# Patient Record
Sex: Female | Born: 1935 | Race: White | Hispanic: No | Marital: Married | Smoking: Former smoker
Health system: Southern US, Community
[De-identification: ages and names within clinical notes are randomized; demographics above are authoritative.]

## PROBLEM LIST (undated history)

## (undated) DIAGNOSIS — H9190 Unspecified hearing loss, unspecified ear: Secondary | ICD-10-CM

## (undated) DIAGNOSIS — M1990 Unspecified osteoarthritis, unspecified site: Secondary | ICD-10-CM

## (undated) DIAGNOSIS — K635 Polyp of colon: Secondary | ICD-10-CM

## (undated) DIAGNOSIS — I10 Essential (primary) hypertension: Secondary | ICD-10-CM

## (undated) DIAGNOSIS — L989 Disorder of the skin and subcutaneous tissue, unspecified: Secondary | ICD-10-CM

## (undated) DIAGNOSIS — K2950 Unspecified chronic gastritis without bleeding: Secondary | ICD-10-CM

## (undated) DIAGNOSIS — L509 Urticaria, unspecified: Secondary | ICD-10-CM

## (undated) DIAGNOSIS — E7800 Pure hypercholesterolemia, unspecified: Secondary | ICD-10-CM

## (undated) DIAGNOSIS — J449 Chronic obstructive pulmonary disease, unspecified: Secondary | ICD-10-CM

## (undated) DIAGNOSIS — K648 Other hemorrhoids: Secondary | ICD-10-CM

## (undated) DIAGNOSIS — K2980 Duodenitis without bleeding: Secondary | ICD-10-CM

## (undated) DIAGNOSIS — K219 Gastro-esophageal reflux disease without esophagitis: Secondary | ICD-10-CM

## (undated) HISTORY — DX: Pure hypercholesterolemia, unspecified: E78.00

## (undated) HISTORY — DX: Duodenitis without bleeding: K29.80

## (undated) HISTORY — PX: COLONOSCOPY: SHX174

## (undated) HISTORY — DX: Other hemorrhoids: K64.8

## (undated) HISTORY — DX: Gastro-esophageal reflux disease without esophagitis: K21.9

## (undated) HISTORY — DX: Essential (primary) hypertension: I10

## (undated) HISTORY — DX: Urticaria, unspecified: L50.9

## (undated) HISTORY — DX: Disorder of the skin and subcutaneous tissue, unspecified: L98.9

## (undated) HISTORY — DX: Unspecified hearing loss, unspecified ear: H91.90

## (undated) HISTORY — DX: Unspecified osteoarthritis, unspecified site: M19.90

## (undated) HISTORY — PX: CATARACT EXTRACTION: SUR2

## (undated) HISTORY — PX: NOSE SURGERY: SHX723

## (undated) HISTORY — PX: ESOPHAGOGASTRODUODENOSCOPY: SHX1529

## (undated) HISTORY — PX: APPENDECTOMY: SHX54

## (undated) HISTORY — PX: REFRACTIVE SURGERY: SHX103

## (undated) HISTORY — DX: Unspecified chronic gastritis without bleeding: K29.50

## (undated) HISTORY — DX: Polyp of colon: K63.5

---

## 1967-02-27 HISTORY — PX: DILATION AND CURETTAGE OF UTERUS: SHX78

## 1998-04-28 ENCOUNTER — Other Ambulatory Visit: Admission: RE | Admit: 1998-04-28 | Discharge: 1998-04-28 | Payer: Self-pay | Admitting: Obstetrics and Gynecology

## 1998-09-16 ENCOUNTER — Encounter (INDEPENDENT_AMBULATORY_CARE_PROVIDER_SITE_OTHER): Payer: Self-pay

## 1998-09-16 ENCOUNTER — Ambulatory Visit (HOSPITAL_COMMUNITY): Admission: RE | Admit: 1998-09-16 | Discharge: 1998-09-16 | Payer: Self-pay | Admitting: Obstetrics and Gynecology

## 1999-05-03 ENCOUNTER — Other Ambulatory Visit: Admission: RE | Admit: 1999-05-03 | Discharge: 1999-05-03 | Payer: Self-pay | Admitting: Obstetrics and Gynecology

## 2000-05-13 ENCOUNTER — Other Ambulatory Visit: Admission: RE | Admit: 2000-05-13 | Discharge: 2000-05-13 | Payer: Self-pay | Admitting: Obstetrics and Gynecology

## 2001-04-17 ENCOUNTER — Ambulatory Visit (HOSPITAL_COMMUNITY): Admission: RE | Admit: 2001-04-17 | Discharge: 2001-04-17 | Payer: Self-pay | Admitting: Gastroenterology

## 2001-04-17 ENCOUNTER — Encounter: Payer: Self-pay | Admitting: Gastroenterology

## 2001-04-22 ENCOUNTER — Encounter (INDEPENDENT_AMBULATORY_CARE_PROVIDER_SITE_OTHER): Payer: Self-pay | Admitting: Gastroenterology

## 2001-04-22 DIAGNOSIS — K2980 Duodenitis without bleeding: Secondary | ICD-10-CM | POA: Insufficient documentation

## 2001-04-22 DIAGNOSIS — K2940 Chronic atrophic gastritis without bleeding: Secondary | ICD-10-CM | POA: Insufficient documentation

## 2001-05-21 ENCOUNTER — Other Ambulatory Visit: Admission: RE | Admit: 2001-05-21 | Discharge: 2001-05-21 | Payer: Self-pay | Admitting: Obstetrics and Gynecology

## 2003-05-26 ENCOUNTER — Other Ambulatory Visit: Admission: RE | Admit: 2003-05-26 | Discharge: 2003-05-26 | Payer: Self-pay | Admitting: Obstetrics and Gynecology

## 2004-03-17 ENCOUNTER — Ambulatory Visit: Payer: Self-pay | Admitting: Internal Medicine

## 2004-07-04 ENCOUNTER — Ambulatory Visit: Payer: Self-pay | Admitting: Internal Medicine

## 2004-09-11 ENCOUNTER — Ambulatory Visit: Payer: Self-pay | Admitting: Gastroenterology

## 2004-10-13 ENCOUNTER — Ambulatory Visit: Payer: Self-pay | Admitting: Internal Medicine

## 2004-10-21 ENCOUNTER — Emergency Department (HOSPITAL_COMMUNITY): Admission: EM | Admit: 2004-10-21 | Discharge: 2004-10-21 | Payer: Self-pay | Admitting: Emergency Medicine

## 2004-10-26 ENCOUNTER — Emergency Department (HOSPITAL_COMMUNITY): Admission: EM | Admit: 2004-10-26 | Discharge: 2004-10-26 | Payer: Self-pay | Admitting: Emergency Medicine

## 2004-12-08 ENCOUNTER — Ambulatory Visit: Payer: Self-pay | Admitting: Gastroenterology

## 2005-01-11 ENCOUNTER — Ambulatory Visit: Payer: Self-pay | Admitting: Internal Medicine

## 2005-02-09 ENCOUNTER — Ambulatory Visit: Payer: Self-pay | Admitting: Internal Medicine

## 2005-04-20 ENCOUNTER — Ambulatory Visit: Payer: Self-pay | Admitting: Internal Medicine

## 2005-06-14 ENCOUNTER — Other Ambulatory Visit: Admission: RE | Admit: 2005-06-14 | Discharge: 2005-06-14 | Payer: Self-pay | Admitting: Obstetrics and Gynecology

## 2005-08-09 ENCOUNTER — Ambulatory Visit: Payer: Self-pay | Admitting: Internal Medicine

## 2005-09-14 ENCOUNTER — Ambulatory Visit: Payer: Self-pay | Admitting: Gastroenterology

## 2005-12-06 ENCOUNTER — Ambulatory Visit: Payer: Self-pay | Admitting: Internal Medicine

## 2006-02-21 ENCOUNTER — Ambulatory Visit: Payer: Self-pay | Admitting: Internal Medicine

## 2006-04-16 ENCOUNTER — Ambulatory Visit: Payer: Self-pay | Admitting: Internal Medicine

## 2006-08-14 ENCOUNTER — Ambulatory Visit: Payer: Self-pay | Admitting: Internal Medicine

## 2006-09-17 ENCOUNTER — Ambulatory Visit: Payer: Self-pay | Admitting: Internal Medicine

## 2006-12-09 ENCOUNTER — Ambulatory Visit: Payer: Self-pay | Admitting: Internal Medicine

## 2007-01-15 DIAGNOSIS — Z860100 Personal history of colon polyps, unspecified: Secondary | ICD-10-CM | POA: Insufficient documentation

## 2007-01-15 DIAGNOSIS — J4520 Mild intermittent asthma, uncomplicated: Secondary | ICD-10-CM

## 2007-01-15 DIAGNOSIS — J309 Allergic rhinitis, unspecified: Secondary | ICD-10-CM | POA: Insufficient documentation

## 2007-01-15 DIAGNOSIS — K219 Gastro-esophageal reflux disease without esophagitis: Secondary | ICD-10-CM

## 2007-02-25 ENCOUNTER — Ambulatory Visit: Payer: Self-pay | Admitting: Internal Medicine

## 2007-02-28 ENCOUNTER — Ambulatory Visit: Payer: Self-pay | Admitting: Internal Medicine

## 2007-02-28 DIAGNOSIS — J069 Acute upper respiratory infection, unspecified: Secondary | ICD-10-CM | POA: Insufficient documentation

## 2007-04-11 ENCOUNTER — Ambulatory Visit: Payer: Self-pay | Admitting: Internal Medicine

## 2007-04-24 ENCOUNTER — Ambulatory Visit: Payer: Self-pay | Admitting: Internal Medicine

## 2007-05-07 ENCOUNTER — Encounter (INDEPENDENT_AMBULATORY_CARE_PROVIDER_SITE_OTHER): Payer: Self-pay | Admitting: Gastroenterology

## 2007-05-07 ENCOUNTER — Ambulatory Visit: Payer: Self-pay | Admitting: Internal Medicine

## 2007-05-07 ENCOUNTER — Encounter: Payer: Self-pay | Admitting: Internal Medicine

## 2007-06-11 DIAGNOSIS — J449 Chronic obstructive pulmonary disease, unspecified: Secondary | ICD-10-CM

## 2007-06-11 DIAGNOSIS — M1990 Unspecified osteoarthritis, unspecified site: Secondary | ICD-10-CM | POA: Insufficient documentation

## 2007-06-11 DIAGNOSIS — K649 Unspecified hemorrhoids: Secondary | ICD-10-CM | POA: Insufficient documentation

## 2007-06-11 DIAGNOSIS — I10 Essential (primary) hypertension: Secondary | ICD-10-CM | POA: Insufficient documentation

## 2007-06-11 DIAGNOSIS — E785 Hyperlipidemia, unspecified: Secondary | ICD-10-CM

## 2007-06-11 DIAGNOSIS — J4489 Other specified chronic obstructive pulmonary disease: Secondary | ICD-10-CM | POA: Insufficient documentation

## 2007-06-23 ENCOUNTER — Other Ambulatory Visit: Admission: RE | Admit: 2007-06-23 | Discharge: 2007-06-23 | Payer: Self-pay | Admitting: Obstetrics and Gynecology

## 2007-08-01 ENCOUNTER — Telehealth: Payer: Self-pay | Admitting: Internal Medicine

## 2007-08-01 ENCOUNTER — Encounter: Payer: Self-pay | Admitting: Internal Medicine

## 2007-08-18 ENCOUNTER — Ambulatory Visit: Payer: Self-pay | Admitting: Internal Medicine

## 2007-09-19 ENCOUNTER — Ambulatory Visit: Payer: Self-pay | Admitting: Internal Medicine

## 2007-12-05 ENCOUNTER — Ambulatory Visit: Payer: Self-pay | Admitting: Internal Medicine

## 2008-02-02 ENCOUNTER — Ambulatory Visit: Payer: Self-pay | Admitting: Internal Medicine

## 2008-02-24 ENCOUNTER — Ambulatory Visit: Payer: Self-pay | Admitting: Internal Medicine

## 2008-03-16 ENCOUNTER — Telehealth: Payer: Self-pay | Admitting: Internal Medicine

## 2008-04-07 ENCOUNTER — Ambulatory Visit: Payer: Self-pay | Admitting: Internal Medicine

## 2008-06-30 ENCOUNTER — Ambulatory Visit: Payer: Self-pay | Admitting: Obstetrics and Gynecology

## 2008-07-20 ENCOUNTER — Telehealth: Payer: Self-pay | Admitting: Internal Medicine

## 2008-07-30 ENCOUNTER — Encounter: Payer: Self-pay | Admitting: Internal Medicine

## 2008-08-05 ENCOUNTER — Telehealth: Payer: Self-pay | Admitting: Internal Medicine

## 2008-08-05 ENCOUNTER — Ambulatory Visit: Payer: Self-pay | Admitting: Internal Medicine

## 2008-12-03 ENCOUNTER — Ambulatory Visit: Payer: Self-pay | Admitting: Internal Medicine

## 2009-01-26 ENCOUNTER — Telehealth (INDEPENDENT_AMBULATORY_CARE_PROVIDER_SITE_OTHER): Payer: Self-pay | Admitting: *Deleted

## 2009-02-24 ENCOUNTER — Ambulatory Visit: Payer: Self-pay | Admitting: Internal Medicine

## 2009-02-28 ENCOUNTER — Telehealth: Payer: Self-pay | Admitting: Internal Medicine

## 2009-07-05 ENCOUNTER — Other Ambulatory Visit: Admission: RE | Admit: 2009-07-05 | Discharge: 2009-07-05 | Payer: Self-pay | Admitting: Obstetrics and Gynecology

## 2009-07-05 ENCOUNTER — Ambulatory Visit: Payer: Self-pay | Admitting: Obstetrics and Gynecology

## 2009-08-05 ENCOUNTER — Ambulatory Visit: Payer: Self-pay | Admitting: Internal Medicine

## 2009-08-09 ENCOUNTER — Encounter: Payer: Self-pay | Admitting: Internal Medicine

## 2009-08-09 ENCOUNTER — Telehealth: Payer: Self-pay | Admitting: Internal Medicine

## 2009-08-16 ENCOUNTER — Ambulatory Visit: Payer: Self-pay | Admitting: Internal Medicine

## 2009-12-23 ENCOUNTER — Ambulatory Visit: Payer: Self-pay | Admitting: Internal Medicine

## 2010-02-23 ENCOUNTER — Ambulatory Visit: Payer: Self-pay | Admitting: Internal Medicine

## 2010-03-28 NOTE — Progress Notes (Signed)
Summary: Nexium   Phone Note Call from Patient Call back at Home Phone [phone]   Caller: Patient Call For: Dr. Leone Payor Reason for Call: Talk to Nurse Summary of Call: having trouble filling Nexium... rx renewal given to pharamcy is not sufficient Initial call taken by: Vallarie Mare,  August 09, 2009 10:12 AM  Follow-up for Phone Call        Prior authorization started.  [employer] will fax form to office. Francee Piccolo CMA Duncan Dull)  August 09, 2009 2:05 PM      Appended Document: Nexium Prior Auth/Approved Nexium approved by [employer].  Pt notified and samples left at front desk.   Clinical Lists Changes  Medications: Changed medication from NEXIUM 40 MG  CPDR (ESOMEPRAZOLE MAGNESIUM) 1 capsule each day 30 minutes before meal to NEXIUM 40 MG  CPDR (ESOMEPRAZOLE MAGNESIUM) 1 capsule each day 30 minutes before meal

## 2010-03-28 NOTE — Assessment & Plan Note (Signed)
Summary: 2 yr f/u...as.    History of Present Illness Visit Type: Follow-up Visit Primary GI MD: Stan Head MD [facility] Primary Provider: [facility] Requesting Provider: n/a Chief Complaint: Patient here for routine follow up of GERD. History of Present Illness:   77 yo white woman, retired Pharmacist, hospital, being seen to follow-up GERD. She  denies any current GI symptoms. Her reflux is controlled on Nexium. She has tried Prilosec OTC in past and that has not relieved her GERD sxs.           EGD  Procedure date:  04/22/2001  Findings:      Hiatal Hernia H. pylori gastritis (Treated) Duodenitis  52 French dilation  Dr. Blossom Hoops   Current Medications (verified): 1)  Feosol 300 Mg  Tabs (Ferrous Sulfate) .... One Tab Twice Daily 2)  Dyazide 37.5-25 Mg  Caps (Triamterene-Hctz) .... Take One Tab By Mouth Once Daily 3)  Calcium 600 Mg  Tabs (Calcium) .... Take Three Times A Day 4)  Multivitamins   Tabs (Multiple Vitamin) .... Take One Tab By Mouth Once Daily 5)  Diclofenac Sodium 25 Mg Tbec (Diclofenac Sodium) .... Three Times A Day 6)  Bd Tb Syringe 25g X 5/8" 1 Ml  Misc (Tuberculin-Allergy Syringes) .... Use To Give Allergy Vaccine As Directed 7)  Allergy Vaccine 1:10 Go (W-E) .... Once Weekly Since 12/08 8)  Epi Pen .... Prn 9)  Proventil Hfa 108 (90 Base) Mcg/act  Aers (Albuterol Sulfate) .... 2 Puffs Four Times A Day As Needed 10)  Simvastatin 20 Mg  Tabs (Simvastatin) .[name].. 1 Once Daily 11)  Nexium 40 Mg  Cpdr (Esomeprazole Magnesium) .[name].. 1 Capsule Each Day 30 Minutes Before Meal 12)  Salmon Oil  Caps (Nutritional Supplements) .... Three Times A Day 13)  Tylenol 325 Mg Tabs (Acetaminophen) .... As Needed As Directed On Bottle 14)  Glucosamine 500 Mg Caps (Glucosamine Sulfate) .... As Needed  Allergies (verified): 1)  Sulfa  Past History:  Past Medical History: Reviewed history from 09/19/2007 and no changes required. COPD, MILD (ICD-496) OSTEOARTHRITIS  (ICD-715.90) DYSLIPIDEMIA (ICD-272.4) HYPERTENSION (ICD-401.9) ALLERGIC RHINITIS (ICD-477.9) ALLERGIC ASTHMA (ICD-493.00) Hx of COLONIC POLYPS, ADENOMATOUS (ICD-211.3) GERD (ICD-530.81) OSTEOPOROSIS H. PYLORI GASTRITIS, RX 2003  Past Surgical History: DEVIATED SEPTUM SURGERY APPENDECTOMY D&C X2 Cataract extraction-bilateral laser eye surgery-bilateral  Family History: No FH of Colon Cancer: Family History of Stomach Cancer: paternal grandfather, ? Paternal Aunt Family History of Heart Disease: father?, Paternal Uncle Mother- deceased age 7; Stroke, asthma Father-deceased age 2; heart disease, cancer Family History of Uterine Cancer: Aunt? Family History of Diabetes: Maternal Grandfather ????  Social History: Patient states former smoker. -stopped age 25 Occupation: retired Statistician Daily Caffeine Use 3 cups per day Illicit Drug Use - no Patient gets regular exercise. Alcohol Use - yes -rare  Vital Signs:  Patient profile:   77 year old female Height:      62.5 inches Weight:      140.50 pounds BMI:     25.38 BSA:     1.66 Pulse rate:   88 / minute Pulse rhythm:   regular BP sitting:   156 / 80  (left arm)  Vitals Entered By: Lamona Curl CMA Duncan Dull) (August 05, 2009 3:46 PM)  Physical Exam  General:  Well developed, well nourished, no acute distress.   Impression & Recommendations:  Problem # 1:  GERD (ICD-530.81) Assessment Unchanged continue PPI as she is (Nexium 40 mg once daily). She should be able to obtain  refills through PCP. we can refill in a year without REV if no problems discovered via phone call.  Patient Instructions: 1)  Please continue current medications.  2)  Please schedule a follow-up appointment in 1-2 years. 3)  The medication list was reviewed and reconciled.  All changed / newly prescribed medications were explained.  A complete medication list was provided to the patient / caregiver. Prescriptions: NEXIUM 40 MG   CPDR (ESOMEPRAZOLE MAGNESIUM) 1 capsule each day 30 minutes before meal  #30 Capsule x 11   Entered and Authorized by:   Iva Boop MD, [facility]   Signed by:   Iva Boop MD, [facility] on 08/05/2009   Method used:   Electronically to        [id number] (retail)       [address]       [place], [place]  [address]       Ph: [phone] or [phone]       Fax: [phone]   RxID:   [id number]  cc: Guerry Bruin, MD

## 2010-03-28 NOTE — Medication Information (Signed)
Summary: Nexium Approved/[employer]  Nexium Approved/[employer]   Imported By: Sherian Rein 08/11/2009 14:06:13  _____________________________________________________________________  External Attachment:    Type:   Image     Comment:   External Document

## 2010-03-28 NOTE — Progress Notes (Signed)
Summary: refills  Phone Note Call from Patient Call back at Home Phone [phone]   Caller: Patient Call For: young Summary of Call: Saw Dr. Maple Hudson on 12/30, needs a refills  on her meds//[facility] Initial call taken by: Darletta Moll,  February 28, 2009 8:36 AM  Follow-up for Phone Call        ATC pt, no answer, no voicemail. will retry later. Carron Curie CMA  February 28, 2009 9:19 AM  Marylene Land spoke with the pt and advised that CY gave her RX for her meds on 02/24/09. Pt states she will take these to her pharmacy. Carron Curie CMA  February 28, 2009 9:19 AM

## 2010-03-30 NOTE — Assessment & Plan Note (Signed)
Summary: 1 year//mbw   Primary Provider/Referring Provider:  [facility]  CC:  Yearly follow up visit-asthma and allergies "doing well overall".  History of Present Illness: 02/25/07- One year follow-up. Doing well ." Breathing has been remarkably blessed' Gives own vaccine at 1:10 no probs (W-E) Rarely needs albuterol Infrequent cough/nose blowing  02/24/08- COPD, asthma, allergic rhinitis Feels dry in winter heat. Good year with no need for rescue asthma therapy.Fewer colds since she retired.Continues allergy vaccine. Got  both flu vax.  February 24, 2009- COPD/ Asthma, allergic rhinitis She has had a good year, noting asthma only with colds. Just in the last week she has started a sore throat without fever or phlegm. She asks prednisone and antibiotic to hold given her past experience. She had flu vax and several pneumonia vaccines in past.  February 23, 2010-  COPD/ Asthma, allergic rhinitis, GERD Nurse-CC: Yearly follow up visit-asthma and allergies "doing well overall" Has worked w/ Dr Leone Payor on GERD- note reviewed. She has had few colds in last 2 years, with less exposure to young people. Very rarely needs Proventil.  She continues allergy vaccine and she is convinced the shots have helped substantially.  Had flu shot.     Asthma History    Initial Asthma Severity Rating:    Age range: 12+ years    Symptoms: 0-2 days/week    Nighttime Awakenings: 0-2/month    Interferes w/ normal activity: no limitations    SABA use (not for EIB): 0-2 days/week    Asthma Severity Assessment: Intermittent   Preventive Screening-Counseling & Management  Alcohol-Tobacco     Smoking Status: quit     Year Quit: 1958     Pack years: 4 years 2 daily  Current Medications (verified): 1)  Feosol 300 Mg  Tabs (Ferrous Sulfate) .... One Tab Twice Daily 2)  Dyazide 37.5-25 Mg  Caps (Triamterene-Hctz) .... Take One Tab By Mouth Once Daily 3)  Calcium 600 Mg  Tabs (Calcium) .... Take Three  Times A Day 4)  Multivitamins   Tabs (Multiple Vitamin) .... Take One Tab By Mouth Once Daily 5)  Diclofenac Sodium 25 Mg Tbec (Diclofenac Sodium) .... Three Times A Day 6)  Bd Tb Syringe 25g X 5/8" 1 Ml  Misc (Tuberculin-Allergy Syringes) .... Use To Give Allergy Vaccine As Directed 7)  Allergy Vaccine 1:10 Go (W-E) .... Once Weekly Since 12/08 8)  Epipen 2-Pak 0.3 Mg/0.47ml Devi (Epinephrine) .... Use As Directed 9)  Proventil Hfa 108 (90 Base) Mcg/act  Aers (Albuterol Sulfate) .... 2 Puffs Four Times A Day As Needed 10)  Simvastatin 20 Mg  Tabs (Simvastatin) .[name].. 1 Once Daily 11)  Nexium 40 Mg  Cpdr (Esomeprazole Magnesium) .[name].. 1 Capsule Each Day 30 Minutes Before Meal 12)  Salmon Oil  Caps (Nutritional Supplements) .... Three Times A Day 13)  Tylenol 325 Mg Tabs (Acetaminophen) .... As Needed As Directed On Bottle 14)  Glucosamine 500 Mg Caps (Glucosamine Sulfate) .... As Needed  Allergies (verified): 1)  Sulfa  Past History:  Past Medical History: Last updated: 09/19/2007 COPD, MILD (ICD-496) OSTEOARTHRITIS (ICD-715.90) DYSLIPIDEMIA (ICD-272.4) HYPERTENSION (ICD-401.9) ALLERGIC RHINITIS (ICD-477.9) ALLERGIC ASTHMA (ICD-493.00) Hx of COLONIC POLYPS, ADENOMATOUS (ICD-211.3) GERD (ICD-530.81) OSTEOPOROSIS H. PYLORI GASTRITIS, RX 2003  Past Surgical History: Last updated: 08/05/2009 DEVIATED SEPTUM SURGERY APPENDECTOMY D&C X2 Cataract extraction-bilateral laser eye surgery-bilateral  Family History: Last updated: 08/05/2009 No FH of Colon Cancer: Family History of Stomach Cancer: paternal grandfather, ? Paternal Aunt Family History of Heart Disease: father?, Paternal  Uncle Mother- deceased age 14; Stroke, asthma Father-deceased age 23; heart disease, cancer Family History of Uterine Cancer: Aunt? Family History of Diabetes: Maternal Grandfather ????  Social History: Last updated: 08/05/2009 Patient states former smoker. -stopped age 86 Occupation: retired Management consultant Daily Caffeine Use 3 cups per day Illicit Drug Use - no Patient gets regular exercise. Alcohol Use - yes -rare  Risk Factors: Exercise: yes (09/19/2007)  Risk Factors: Smoking Status: quit (02/23/2010)  Review of Systems      See HPI  The patient denies shortness of breath with activity, shortness of breath at rest, productive cough, non-productive cough, coughing up blood, chest pain, irregular heartbeats, acid heartburn, indigestion, loss of appetite, weight change, abdominal pain, difficulty swallowing, sore throat, tooth/dental problems, headaches, nasal congestion/difficulty breathing through nose, and sneezing.    Vital Signs:  Patient profile:   77 year old female Height:      62.5 inches Weight:      140.25 pounds BMI:     25.33 O2 Sat:      98 % on Room air Pulse rate:   94 / minute BP sitting:   136 / 88  (left arm) Cuff size:   regular  Vitals Entered By: Reynaldo Minium CMA (February 23, 2010 10:05 AM)  O2 Flow:  Room air CC: Yearly follow up visit-asthma and allergies "doing well overall"   Physical Exam  Additional Exam:  General: A/Ox3; pleasant and cooperative, NAD, wdwn SKIN: no rash, lesions NODES: no lymphadenopathy HEENT: [place]/AT, EOM- WNL, Conjuctivae- clear, PERRLA, TM-WNL, Nose- clear, Throat- clear and wnl, Mallampati  II, not red, no drainage NECK: Supple w/ fair ROM, JVD- none, normal carotid impulses w/o bruits Thyroid- CHEST: clear to P&A HEART: RRR, no m/g/r heard ABDOMEN: Soft and nl; KGM:WNUU, nl pulses, no edema  NEURO: Grossly intact to observation      Impression & Recommendations:  Problem # 1:  ALLERGIC RHINITIS (ICD-477.9)  Good control and she is comfortable with her allergy vaccine. We discussed and refilled Epipen.  Problem # 2:  ALLERGIC ASTHMA (ICD-493.00) Very well controlled. No changes indicated.   Medications Added to Medication List This Visit: 1)  Epipen 2-pak 0.3 Mg/0.64ml Devi (Epinephrine) .... Use  as directed  Other Orders: Est. Patient Level III (72536)  Patient Instructions: 1)  Please schedule a follow-up appointment in 1 year. 2)  Continue allergy vaccine 3)  Refill script for Epipen and Proair  Prescriptions: PROVENTIL HFA 108 (90 BASE) MCG/ACT  AERS (ALBUTEROL SULFATE) 2 puffs four times a day as needed  #1 x prn   Entered and Authorized by:   Waymon Budge MD   Signed by:   Waymon Budge MD on 02/23/2010   Method used:   Print then Give to Patient   RxID:   [id number] EPIPEN 2-PAK 0.3 MG/0.3ML DEVI (EPINEPHRINE) Use as directed  #1 x prn   Entered and Authorized by:   Waymon Budge MD   Signed by:   Waymon Budge MD on 02/23/2010   Method used:   Print then Give to Patient   RxID:   [id number]

## 2010-05-08 ENCOUNTER — Ambulatory Visit (INDEPENDENT_AMBULATORY_CARE_PROVIDER_SITE_OTHER): Payer: Medicare Other

## 2010-05-09 ENCOUNTER — Encounter: Payer: Self-pay | Admitting: Internal Medicine

## 2010-05-09 DIAGNOSIS — J301 Allergic rhinitis due to pollen: Secondary | ICD-10-CM

## 2010-05-09 DIAGNOSIS — J302 Other seasonal allergic rhinitis: Secondary | ICD-10-CM

## 2010-05-09 DIAGNOSIS — J3089 Other allergic rhinitis: Secondary | ICD-10-CM | POA: Insufficient documentation

## 2010-05-16 NOTE — Assessment & Plan Note (Signed)
Summary: extract10/cb   Nurse Visit   Allergies: 1)  Sulfa  Orders Added: 1)  [facility],1 or multi Secondary school teacher) [phone]

## 2010-07-11 NOTE — Assessment & Plan Note (Signed)
[facility]                         GASTROENTEROLOGY OFFICE NOTE   Lindsey Norris, Lindsey Norris                    MRN:          [id number]  DATE:09/17/2006                            DOB:          1926-03-13    CHIEF COMPLAINT:  No particular problems.  Here to discuss medications  and refills.   PROBLEMS:  1. Gastroesophageal reflux disease.  2. Allergic asthma.  3. Allergic rhinitis.  4. Hypertension.  5. Prior appendectomy.  6. Obesity.  7. History of colon polyps.  8. Osteoarthritis.  9. Last colonoscopy on May 07, 2002, normal.  10.Last EGD on April 22, 2001, with a 2 cm hiatal hernia and      esophageal spasm, H. pylori positive.  (She was treated for this      she tells me.)  11.Colonoscopy on April 22, 2001, with multiple polyps, the largest      a 12 mm sessile polyp that was adenomatous.  12.Dyslipidemia.  13.Osteoporosis.  14.Recent anemia.  Hemoglobin was 11.5 and normalized on iron.   MEDICATIONS:  1. Nexium 40 mg daily.  2. Feosol 300 mg b.i.d.  3. Dyazide daily.  4. Lipitor 20 mg daily.  5. Tums t.i.d.  6. Multivitamin daily.  7. Voltaren 25 mg daily.  8. Fosamax 70 mg weekly.  9. Allergy shots weekly.  10.Asthma inhaler p.r.n.  11.Vaginal cream p.r.n.   The patient denies any particular complaints.  She would like a refill  on her Nexium.  She is doing well otherwise with that.   PHYSICAL EXAMINATION:  GENERAL:  She is in no acute distress, well-  developed elderly white woman.  VITAL SIGNS:  Weight 143 pounds, pulse 92, blood pressure 142/82.  HEENT:  Eyes anicteric.  LUNGS:  Clear.  HEART:  S1 and S2.  No rubs, murmurs or gallops.  ABDOMEN:  Soft, nontender.  No organomegaly or mass.  NEUROLOGIC:  She is alert and oriented x3.   ASSESSMENT:  1. Gastroesophageal reflux disease under good control on Nexium.  2. History of an adenomatous colon polyp.   PLAN:  1. Refill Nexium 40 mg daily for one year.  She  has warned me that in      April a prior authorization will come due.  2. Repeat colonoscopy in March of 2009.     Iva Boop, MD,FACG  Electronically Signed    CEG/MedQ  DD: 09/17/2006  DT: 09/18/2006  Job #: [id number]   cc:   Gaspar Garbe, M.D.

## 2010-07-14 NOTE — Assessment & Plan Note (Signed)
[facility]                           GASTROENTEROLOGY OFFICE NOTE   Lindsey Norris, Lindsey Norris                    MRN:          [id number]  DATE:09/14/2005                            DOB:          07-21-1926    The patient comes in on September 14, 2005 and states she wants a prescription  for Nexium and also wants to know if she should be checked for H. pylori.  I  told her that I did not really think so; she had been treated for this and  she had had a check at one time in the past that was negative.  She has had  no abdominal pain.  Bowel movements are regular.   On physical examination, she weighed 143, blood pressure 138/74, pulse 86  and regular.  Neck, head and extremities are all unremarkable.   IMPRESSION:  1.  Gastroesophageal reflux disease, controlled well with Nexium.  2.  History of allergic rhinitis.  3.  History of intermittent asthma.  4.  Status post appendectomy.   RECOMMENDATION:  To continue on her medication as she is doing and I wrote  her a prescription for this for the year.                                   Ulyess Mort, MD   SML/MedQ  DD:  09/17/2005  DT:  09/18/2005  Job #:  [id number]

## 2010-07-14 NOTE — Assessment & Plan Note (Signed)
[facility]                             PULMONARY OFFICE NOTE   HUNTER, Lindsey Norris                    MRN:          [id number]  DATE:02/21/2006                            DOB:          12/08/1926    PROBLEM:  1. Allergic asthma.  2. Allergic rhinitis.  3. Esophageal reflux.   HISTORY OF PRESENT ILLNESS:  One year follow-up.  She feels she has done  quite well with her breathing.  No problems with her allergy vaccine and  she wants to continue it.  Her vaccine is set at 1/50.  We discussed  issues of risk and benefit, administration outside of a medical office,  anaphylaxis, and epinephrine.  We are refilling with an Epi-Pen Junior  because of her age.  She has had some stories published and we discussed  that.  She had had shingles unfortunately.  Today she feels well.   MEDICATIONS:  1. Aspirin.  2. Dyazide 37.5/25.  3. Allergy vaccine.  4. Nexium 40 mg.  5. Lipitor 20 mg.  6. Tums200 mg t.i.d.  7. Voltaren 25 mg b.i.d.  8. Fosamax 60 mg per week.  9. Albuterol rescue inhaler.   OBJECTIVE:  VITAL SIGNS:  Weight 147 pounds, blood pressure 128/78,  pulse regular and 107, room air saturation 97%.  HEENT:  Eyes, nose, and throat are clear today.  LUNGS:  Clear with unlabored breathing.  No wheeze or cough.  HEART:  Heart sounds regular without murmur.  No edema.   IMPRESSION:  Currently stable allergic rhinitis and asthma.  Overall  control has been satisfactory.   PLAN:  Audiological scientist.  Albuterol was refilled.  Schedule return one  year, earlier p.r.n.     Clinton D. Maple Hudson, MD, Tonny Bollman, FACP  Electronically Signed   CDY/MedQ  DD: 02/23/2006  DT: 02/23/2006  Job #: [id number]   cc:   Gaspar Garbe, M.D.

## 2010-08-07 ENCOUNTER — Encounter (INDEPENDENT_AMBULATORY_CARE_PROVIDER_SITE_OTHER): Payer: Medicare Other | Admitting: Obstetrics and Gynecology

## 2010-08-07 DIAGNOSIS — N951 Menopausal and female climacteric states: Secondary | ICD-10-CM

## 2010-08-07 DIAGNOSIS — N952 Postmenopausal atrophic vaginitis: Secondary | ICD-10-CM

## 2010-08-07 DIAGNOSIS — N766 Ulceration of vulva: Secondary | ICD-10-CM

## 2010-08-07 DIAGNOSIS — R809 Proteinuria, unspecified: Secondary | ICD-10-CM

## 2010-08-17 ENCOUNTER — Telehealth: Payer: Self-pay | Admitting: Internal Medicine

## 2010-08-17 NOTE — Telephone Encounter (Signed)
I spoke with the patient per office note 08/05/09 follow up in 1-2 years.  The patient has no current symptoms and she will schedule an appt for next June and call us back if she has any issues in the meantime.

## 2010-08-31 ENCOUNTER — Other Ambulatory Visit: Payer: Self-pay | Admitting: Internal Medicine

## 2010-09-01 ENCOUNTER — Telehealth: Payer: Self-pay | Admitting: Gastroenterology

## 2010-09-01 NOTE — Telephone Encounter (Signed)
                          <   back        Case ID: [id number] Member Number: [id number]  Case Type: Initial Review Case Start Date: 09/01/2010  Case Status: Coverage has been APPROVED. You will receive a confirmation letter confirming approval of this medication. The patient will also be notified of this approval via an automated outbound phone call or a letter. Please allow approximately 2 hours to update our system with the approval. Once updated, the prescription can be re-submitted.   Coverage  Start Date: 08/11/2010 Coverage  End Date: 09/01/2011     Patient  First Name: Lindsey Patient  Last Name: [facility] Norris DOB: February 11, 1927  Patient  Street Address: [address]  Patient City: [place] Patient State: [place] Patient Zip: [phone]     Drug Name  & Strength: Nexium 40 Mg

## 2010-10-02 ENCOUNTER — Encounter: Payer: Self-pay | Admitting: Obstetrics and Gynecology

## 2010-10-03 ENCOUNTER — Other Ambulatory Visit: Payer: Self-pay

## 2010-10-03 DIAGNOSIS — R921 Mammographic calcification found on diagnostic imaging of breast: Secondary | ICD-10-CM

## 2010-10-06 ENCOUNTER — Ambulatory Visit (INDEPENDENT_AMBULATORY_CARE_PROVIDER_SITE_OTHER): Payer: Medicare Other

## 2010-10-06 DIAGNOSIS — J309 Allergic rhinitis, unspecified: Secondary | ICD-10-CM

## 2011-02-08 ENCOUNTER — Ambulatory Visit (INDEPENDENT_AMBULATORY_CARE_PROVIDER_SITE_OTHER): Payer: Medicare Other

## 2011-02-08 DIAGNOSIS — J309 Allergic rhinitis, unspecified: Secondary | ICD-10-CM

## 2011-02-16 ENCOUNTER — Other Ambulatory Visit: Payer: Self-pay | Admitting: Obstetrics and Gynecology

## 2011-02-23 ENCOUNTER — Encounter: Payer: Self-pay | Admitting: Internal Medicine

## 2011-02-23 ENCOUNTER — Ambulatory Visit (INDEPENDENT_AMBULATORY_CARE_PROVIDER_SITE_OTHER): Payer: Medicare Other | Admitting: Internal Medicine

## 2011-02-23 VITALS — BP 142/96 | HR 109 | Ht 62.5 in | Wt 135.8 lb

## 2011-02-23 DIAGNOSIS — J301 Allergic rhinitis due to pollen: Secondary | ICD-10-CM

## 2011-02-23 NOTE — Progress Notes (Signed)
02/23/11- 77 yo f former smoker followed for asthma/ COPD, allergic rhinitis, GERD LOV-02/23/2010 Has had flu vaccine. She is continued allergy vaccine, giving her own with no problems and believes it has helped her. She never needs Proventil rescue inhaler and no longer has significant acute respiratory problems beyond an occasional cold. Nasal symptoms are not bothering her. We discussed stopping her allergy vaccine now and see how she does without it.   ROS-see HPI Constitutional:   No-   weight loss, night sweats, fevers, chills, fatigue, lassitude. HEENT:   No-  headaches, difficulty swallowing, tooth/dental problems, sore throat,       No-  sneezing, itching, ear ache, nasal congestion, post nasal drip,  CV:  No-   chest pain, orthopnea, PND, swelling in lower extremities, anasarca, dizziness, palpitations Resp: No-   shortness of breath with exertion or at rest.              No-   productive cough,  No non-productive cough,  No- coughing up of blood.              No-   change in color of mucus.  No- wheezing.   Skin: No-   rash or lesions. GI:  No-   heartburn, indigestion, abdominal pain, nausea, vomiting, diarrhea,                 change in bowel habits, loss of appetite GU: No-   dysuria, change in color of urine, no urgency or frequency.  No- flank pain. MS:  No-   joint pain or swelling.  No- decreased range of motion.  No- back pain. Neuro-     nothing unusual Psych:  No- change in mood or affect. No depression or anxiety.  No memory loss.  OBJ General- Alert, Oriented, Affect-appropriate, Distress- none acute Skin- rash-none, lesions- none, excoriation- none Lymphadenopathy- none Head- atraumatic            Eyes- Gross vision intact, PERRLA, conjunctivae clear secretions            Ears- Hearing, canals-normal            Nose- Clear, no-Septal dev, mucus, polyps, erosion, perforation             Throat- Mallampati II , mucosa clear , drainage- none, tonsils-  atrophic Neck- flexible , trachea midline, no stridor , thyroid nl, carotid no bruit Chest - symmetrical excursion , unlabored           Heart/CV- RRR , no murmur , no gallop  , no rub, nl s1 s2                           - JVD- none , edema- none, stasis changes- none, varices- none           Lung- clear to P&A, wheeze- none, cough- none , dullness-none, rub- none           Chest wall-  Abd- tender-no, distended-no, bowel sounds-present, HSM- no Br/ Gen/ Rectal- Not done, not indicated Extrem- cyanosis- none, clubbing, none, atrophy- none, strength- nl Neuro- grossly intact to observation

## 2011-02-23 NOTE — Patient Instructions (Signed)
When you use up your current allergy vaccine, it will be okay to stop your allergy shots and watch to see how you do.   We won't refill the rescue albuterol inhaler for now since you never seem to need it. I will be happy to see you back if you need my help. Meanwhile Dr Wylene Simmer can manage routine problems for you.

## 2011-02-26 NOTE — Assessment & Plan Note (Signed)
She has done well with allergy vaccine and has been on it long enough. Currently control is very good. Plan-use up the last of the current vaccine supply then stop. OTC antihistamines when necessary

## 2011-03-30 ENCOUNTER — Telehealth: Payer: Self-pay | Admitting: Internal Medicine

## 2011-03-30 NOTE — Telephone Encounter (Signed)
Thanks for the info- however no phone note needed on this.

## 2011-06-07 ENCOUNTER — Telehealth: Payer: Self-pay | Admitting: Internal Medicine

## 2011-06-07 NOTE — Telephone Encounter (Signed)
Spoke with pt. She states that she is questioning whether or not it is a good idea to stop her allergy shots as d/w CDY at her last ov. She states that she has had bad allergy symptoms already this spring and wants to know if she can continue shots for now. Please advise, thanks!

## 2011-06-08 NOTE — Telephone Encounter (Signed)
Per CY in discussion I had with him start pt. At 0.1 of 1:10. I called Mrs.Island she still has three wks.of vac.left. She hasn't stopped her shots. As Verlon Au said in her note you discussed with Mrs.Lemon about stopping her shots; she has not stopped her shots and does not wish to. She's just asking to continue r vac.and she also needs a refill. Please advise.

## 2011-06-08 NOTE — Telephone Encounter (Signed)
Ok to continue allergy vaccine at 1:10 0.5 as she has been doing.

## 2011-06-08 NOTE — Telephone Encounter (Signed)
done

## 2011-06-11 ENCOUNTER — Ambulatory Visit (INDEPENDENT_AMBULATORY_CARE_PROVIDER_SITE_OTHER): Payer: Medicare Other

## 2011-06-11 DIAGNOSIS — J309 Allergic rhinitis, unspecified: Secondary | ICD-10-CM

## 2011-06-13 ENCOUNTER — Other Ambulatory Visit: Payer: Self-pay | Admitting: Obstetrics and Gynecology

## 2011-07-02 ENCOUNTER — Other Ambulatory Visit: Payer: Self-pay | Admitting: Internal Medicine

## 2011-08-08 ENCOUNTER — Encounter: Payer: Self-pay | Admitting: Obstetrics and Gynecology

## 2011-08-08 ENCOUNTER — Ambulatory Visit (INDEPENDENT_AMBULATORY_CARE_PROVIDER_SITE_OTHER): Payer: Medicare Other | Admitting: Obstetrics and Gynecology

## 2011-08-08 VITALS — BP 150/94 | Ht 61.0 in | Wt 128.0 lb

## 2011-08-08 DIAGNOSIS — N904 Leukoplakia of vulva: Secondary | ICD-10-CM

## 2011-08-08 DIAGNOSIS — R350 Frequency of micturition: Secondary | ICD-10-CM

## 2011-08-08 DIAGNOSIS — M949 Disorder of cartilage, unspecified: Secondary | ICD-10-CM

## 2011-08-08 DIAGNOSIS — R928 Other abnormal and inconclusive findings on diagnostic imaging of breast: Secondary | ICD-10-CM

## 2011-08-08 DIAGNOSIS — M1990 Unspecified osteoarthritis, unspecified site: Secondary | ICD-10-CM | POA: Insufficient documentation

## 2011-08-08 DIAGNOSIS — R921 Mammographic calcification found on diagnostic imaging of breast: Secondary | ICD-10-CM

## 2011-08-08 DIAGNOSIS — N952 Postmenopausal atrophic vaginitis: Secondary | ICD-10-CM

## 2011-08-08 DIAGNOSIS — M8580 Other specified disorders of bone density and structure, unspecified site: Secondary | ICD-10-CM

## 2011-08-08 NOTE — Progress Notes (Signed)
Patient came to see me today for further followup. She has been watched closely on mammogram because of calcifications of her left breast. They have remained stable and so far she's not require biopsy. She continues to use Temovate for treatment of lichen sclerosis which we diagnosed her with many years ago. Although we have tried to have her use it intermittently she finds  she needs to use it daily or she gets too irritated. She is having no vaginal bleeding. She is having no pelvic pain. She is on drug holiday from Fosamax. She took it for 4-5 years and has been off of it for several years now. She did get reflux with it. Her last bone density was 2 years ago and showed improvement in her osteopenia. She's had no fractures. She continues with calcium and vitamin D. She tell me today that if she had to  be retreated she would like to use injectables. She does have vaginal dryness but is not sexually active. She does have urinary frequency without dysuria, urgency, or incontinence. She has never had an abnormal Pap smear.  ROS: 12 systems reviewed done. Pertinent positives above. Other positives include elevated cholesterol,hemorrhoids, asthma, hypertension, GERD and osteoarthritis.  Physical examination: HEENT within normal limits. Neck: Thyroid not large. No masses. Supraclavicular nodes: not enlarged. Breasts: Examined in both sitting and lying  position. No skin changes and no masses. Abdomen: Soft no guarding rebound or masses or hernia. Pelvic: External: vulvitis without lichen sclerosis. BUS: Within normal limits. Vaginal:within normal limits. Good estrogen effect. No evidence of cystocele rectocele or enterocele. Cervix: clean. Uterus: Normal size and shape. Adnexa: No masses. Rectovaginal exam: Confirmatory and negative. Extremities: Within normal limits.  Assessment: #1. Vulvitis #2. Osteopenia #3. Urinary frequency #4. Atrophic vaginitis #5. Calcifications of left breast.  Plan: Mammogram  and  bone density this summer. Continue Temovate.

## 2011-08-09 LAB — URINALYSIS W MICROSCOPIC + REFLEX CULTURE
Bilirubin Urine: NEGATIVE
Casts: NONE SEEN
Glucose, UA: NEGATIVE mg/dL
Hgb urine dipstick: NEGATIVE
Leukocytes, UA: NEGATIVE
Squamous Epithelial / LPF: NONE SEEN
pH: 7.5 (ref 5.0–8.0)

## 2011-08-24 ENCOUNTER — Other Ambulatory Visit: Payer: Self-pay | Admitting: Internal Medicine

## 2011-09-06 ENCOUNTER — Encounter: Payer: Self-pay | Admitting: Internal Medicine

## 2011-09-06 ENCOUNTER — Ambulatory Visit (INDEPENDENT_AMBULATORY_CARE_PROVIDER_SITE_OTHER): Payer: Medicare Other | Admitting: Internal Medicine

## 2011-09-06 VITALS — BP 150/90 | HR 88 | Ht 61.0 in | Wt 138.0 lb

## 2011-09-06 DIAGNOSIS — Z86010 Personal history of colonic polyps: Secondary | ICD-10-CM

## 2011-09-06 DIAGNOSIS — K219 Gastro-esophageal reflux disease without esophagitis: Secondary | ICD-10-CM

## 2011-09-06 NOTE — Progress Notes (Signed)
Patient ID: Lindsey Norris, female   DOB: 1926-08-26, 78 y.o.   MRN: [id number]  She presents with a chief complaint of followup of GERD and to discuss routine repeat colonoscopy for colon cancer screening.  History  She is doing well, taking Nexium with good control of reflux. Her last colonoscopy was in 2009 with a 2 mm diminutive adenoma. She had an advanced adenoma in 2003, no polyps in 2004. She indicates that she is not inclined to pursue routine preventative colonoscopy. She thought actually she would be due this year but it is next year according to guidelines. Given her age and expected survival based upon that, she has indicated she would like to forego routine preventative colonoscopy. She is not inclined to do Hemoccults either. I agree.  Medications, allergies, past medical history, past surgical history, family history and social history are reviewed and updated in the EMR.   Assessment and plan:  1. GERD (gastroesophageal reflux disease)   2. Personal history of colonic polyps    1. Continue Nexium for GERD, refills to her PCP most likely. 2. Discontinue routine colorectal cancer screening given her age. She is fully aware of the risks and benefits.  She will see me as needed.  I appreciate the opportunity to care for this patient.   CC: Gaspar Garbe, MD

## 2011-09-06 NOTE — Patient Instructions (Addendum)
We have decided not to pursue further colorectal cancer screening. Please call or come back if you develop gastrointestinal problems and need help.  Thanks.  Iva Boop, MD, Clementeen Graham

## 2011-10-08 ENCOUNTER — Telehealth: Payer: Self-pay | Admitting: *Deleted

## 2011-10-08 NOTE — Telephone Encounter (Signed)
Pt giving # to [facility] to set up mammogram screening.

## 2011-10-17 ENCOUNTER — Other Ambulatory Visit: Payer: Self-pay | Admitting: Obstetrics and Gynecology

## 2011-10-25 ENCOUNTER — Other Ambulatory Visit: Payer: Self-pay | Admitting: Internal Medicine

## 2011-11-06 ENCOUNTER — Ambulatory Visit (INDEPENDENT_AMBULATORY_CARE_PROVIDER_SITE_OTHER): Payer: Medicare Other

## 2011-11-06 DIAGNOSIS — J309 Allergic rhinitis, unspecified: Secondary | ICD-10-CM

## 2011-11-15 ENCOUNTER — Other Ambulatory Visit: Payer: Self-pay | Admitting: *Deleted

## 2011-11-15 DIAGNOSIS — M8580 Other specified disorders of bone density and structure, unspecified site: Secondary | ICD-10-CM

## 2011-11-20 ENCOUNTER — Encounter: Payer: Self-pay | Admitting: Obstetrics and Gynecology

## 2011-11-21 ENCOUNTER — Other Ambulatory Visit: Payer: Self-pay | Admitting: Obstetrics and Gynecology

## 2011-11-21 DIAGNOSIS — M8580 Other specified disorders of bone density and structure, unspecified site: Secondary | ICD-10-CM

## 2011-11-26 ENCOUNTER — Other Ambulatory Visit: Payer: Self-pay | Admitting: Internal Medicine

## 2011-12-07 ENCOUNTER — Ambulatory Visit (INDEPENDENT_AMBULATORY_CARE_PROVIDER_SITE_OTHER): Payer: Medicare Other | Admitting: Obstetrics and Gynecology

## 2011-12-07 DIAGNOSIS — M810 Age-related osteoporosis without current pathological fracture: Secondary | ICD-10-CM

## 2011-12-07 NOTE — Progress Notes (Signed)
Patient came to see me today because of a  worsening bone density. She now has osteoporosis. She has had additional loss of bone in  her hips. She is taking calcium and vitamin D. She previously took Evista. Since she lost bone on Evista she was switched to a biphosphonate but  got terrible reflux. She is a nonsmoker nondrinker. She tries to exercise.  We had a long discussion of the above. Blood was drawn for secondary causes of  bone loss. We discussed Prolia or Atelvia. We will Try to get her approved for Prolia. She will check on price of atelvia.

## 2011-12-07 NOTE — Patient Instructions (Addendum)
We will call you with lab results.       

## 2011-12-08 LAB — VITAMIN D 25 HYDROXY (VIT D DEFICIENCY, FRACTURES): Vit D, 25-Hydroxy: 55 ng/mL (ref 30–89)

## 2011-12-10 LAB — PTH, INTACT AND CALCIUM: PTH: 51.8 pg/mL (ref 14.0–72.0)

## 2011-12-18 ENCOUNTER — Telehealth: Payer: Self-pay | Admitting: *Deleted

## 2011-12-18 NOTE — Telephone Encounter (Signed)
Message copied by Richardson Chiquito on Tue Dec 18, 2011  2:49 PM ------      Message from: Trellis Paganini      Created: Fri Dec 07, 2011  5:07 PM       Please get patient approved for Prolia. She has osteoporosis. She loss bone on Evista. She had terrible reflux with oral biphosphonates.

## 2011-12-18 NOTE — Telephone Encounter (Signed)
Pts insurance information was sent to the [employer] for verification. It needed a prior authorization, approved by her insurance. Pt was informed and an appt set up to receive injection on Nov 1st.

## 2011-12-28 ENCOUNTER — Ambulatory Visit (INDEPENDENT_AMBULATORY_CARE_PROVIDER_SITE_OTHER): Payer: Medicare Other | Admitting: Anesthesiology

## 2011-12-28 DIAGNOSIS — M810 Age-related osteoporosis without current pathological fracture: Secondary | ICD-10-CM

## 2011-12-28 MED ORDER — DENOSUMAB 60 MG/ML ~~LOC~~ SOLN
60.0000 mg | Freq: Once | SUBCUTANEOUS | Status: AC
  Administered 2011-12-28: 60 mg via SUBCUTANEOUS

## 2012-01-17 ENCOUNTER — Other Ambulatory Visit: Payer: Self-pay | Admitting: Obstetrics and Gynecology

## 2012-01-22 ENCOUNTER — Other Ambulatory Visit: Payer: Self-pay | Admitting: Internal Medicine

## 2012-02-25 ENCOUNTER — Ambulatory Visit (INDEPENDENT_AMBULATORY_CARE_PROVIDER_SITE_OTHER): Payer: Medicare Other

## 2012-02-25 ENCOUNTER — Ambulatory Visit (INDEPENDENT_AMBULATORY_CARE_PROVIDER_SITE_OTHER): Payer: Medicare Other | Admitting: Internal Medicine

## 2012-02-25 ENCOUNTER — Encounter: Payer: Self-pay | Admitting: Internal Medicine

## 2012-02-25 VITALS — BP 120/82 | HR 100 | Ht 62.5 in | Wt 129.8 lb

## 2012-02-25 DIAGNOSIS — J45909 Unspecified asthma, uncomplicated: Secondary | ICD-10-CM

## 2012-02-25 DIAGNOSIS — J301 Allergic rhinitis due to pollen: Secondary | ICD-10-CM

## 2012-02-25 DIAGNOSIS — J309 Allergic rhinitis, unspecified: Secondary | ICD-10-CM

## 2012-02-25 MED ORDER — EPINEPHRINE 0.3 MG/0.3ML IJ DEVI: INTRAMUSCULAR | Status: AC

## 2012-02-25 NOTE — Patient Instructions (Addendum)
We can continue allergy vaccine at 1:10  Refill script for Epipen  Please call as needed

## 2012-02-25 NOTE — Progress Notes (Signed)
02/23/11- 78 yo f former smoker followed for asthma/ COPD, allergic rhinitis, GERD LOV-02/23/2010 Has had flu vaccine. She is continued allergy vaccine, giving her own with no problems and believes it has helped her. She never needs Proventil rescue inhaler and no longer has significant acute respiratory problems beyond an occasional cold. Nasal symptoms are not bothering her. We discussed stopping her allergy vaccine now and see how she does without it.  02/25/12- 89 yo f former smoker followed for asthma/ COPD, allergic rhinitis, GERD FOLLOWS FOR: still on vaccine-OV to continue vaccine and needs refilled; also needs Epipen refilled She wants to continue her allergy vaccine 1:10/ GO after stopping briefly. Has had no acute problems. We discussed continued use of allergy vaccine, goals, risks, realistic expectations. She needs EpiPen refill. Her primary physician gets her chest x-rays.  ROS-see HPI Constitutional:   No-   weight loss, night sweats, fevers, chills, fatigue, lassitude. HEENT:   No-  headaches, difficulty swallowing, tooth/dental problems, sore throat,       No-  sneezing, itching, ear ache, nasal congestion, post nasal drip,  CV:  No-   chest pain, orthopnea, PND, swelling in lower extremities, anasarca, dizziness, palpitations Resp: No-   shortness of breath with exertion or at rest.              No-   productive cough,  No non-productive cough,  No- coughing up of blood.              No-   change in color of mucus.  No- wheezing.   Skin: No-   rash or lesions. GI:  No-   heartburn, indigestion, abdominal pain, nausea, vomiting,  GU: . MS:  No-   joint pain or swelling.   Neuro-     nothing unusual Psych:  No- change in mood or affect. No depression or anxiety.  No memory loss.  OBJ General- Alert, Oriented, Affect-appropriate, Distress- none acute Skin- rash-none, lesions- none, excoriation- none Lymphadenopathy- none Head- atraumatic            Eyes- Gross vision  intact, PERRLA, conjunctivae clear secretions            Ears- Hearing, canals-normal            Nose- Clear, no-Septal dev, mucus, polyps, erosion, perforation             Throat- Mallampati II , mucosa clear , drainage- none, tonsils- atrophic Neck- flexible , trachea midline, no stridor , thyroid nl, carotid no bruit Chest - symmetrical excursion , unlabored           Heart/CV- RRR , no murmur , no gallop  , no rub, nl s1 s2                           - JVD- none , edema- none, stasis changes- none, varices- none           Lung- clear to P&A, wheeze- none, cough- none , dullness-none, rub- none           Chest wall-  Abd-  Br/ Gen/ Rectal- Not done, not indicated Extrem- cyanosis- none, clubbing, none, atrophy- none, strength- nl Neuro- grossly intact to observation

## 2012-03-08 NOTE — Assessment & Plan Note (Signed)
Remote smoking history. Mild asthma or COPD with little daily impact.

## 2012-03-08 NOTE — Assessment & Plan Note (Signed)
Good control with no changes needed 

## 2012-06-20 ENCOUNTER — Ambulatory Visit (INDEPENDENT_AMBULATORY_CARE_PROVIDER_SITE_OTHER): Payer: Medicare Other

## 2012-06-20 DIAGNOSIS — J309 Allergic rhinitis, unspecified: Secondary | ICD-10-CM

## 2012-06-30 ENCOUNTER — Ambulatory Visit: Payer: Medicare PPO

## 2012-07-03 ENCOUNTER — Telehealth: Payer: Self-pay | Admitting: *Deleted

## 2012-07-03 NOTE — Telephone Encounter (Signed)
Pt was informed of insurance benefits with her new insurance. [employer].  It was approved by her insurance from 07/07/12- 07/07/13 auth # [id number] 20% coinsurance (with a $50 copay max) and $40 copay with admin or OV Apt made 5/13 at 4pm

## 2012-07-08 ENCOUNTER — Ambulatory Visit (INDEPENDENT_AMBULATORY_CARE_PROVIDER_SITE_OTHER): Payer: Medicare PPO | Admitting: *Deleted

## 2012-07-08 DIAGNOSIS — M810 Age-related osteoporosis without current pathological fracture: Secondary | ICD-10-CM

## 2012-07-08 MED ORDER — DENOSUMAB 60 MG/ML ~~LOC~~ SOLN
60.0000 mg | Freq: Once | SUBCUTANEOUS | Status: AC
  Administered 2012-07-08: 60 mg via SUBCUTANEOUS

## 2012-07-11 ENCOUNTER — Other Ambulatory Visit: Payer: Self-pay | Admitting: Obstetrics and Gynecology

## 2012-08-12 ENCOUNTER — Encounter: Payer: Self-pay | Admitting: Gynecology

## 2012-08-12 ENCOUNTER — Ambulatory Visit (INDEPENDENT_AMBULATORY_CARE_PROVIDER_SITE_OTHER): Payer: Medicare PPO | Admitting: Gynecology

## 2012-08-12 VITALS — BP 136/80 | Ht 61.0 in | Wt 124.0 lb

## 2012-08-12 DIAGNOSIS — N952 Postmenopausal atrophic vaginitis: Secondary | ICD-10-CM

## 2012-08-12 DIAGNOSIS — R21 Rash and other nonspecific skin eruption: Secondary | ICD-10-CM

## 2012-08-12 DIAGNOSIS — M810 Age-related osteoporosis without current pathological fracture: Secondary | ICD-10-CM

## 2012-08-12 MED ORDER — BETAMETHASONE DIPROPIONATE AUG 0.05 % EX CREA: TOPICAL_CREAM | Freq: Two times a day (BID) | CUTANEOUS | Status: DC

## 2012-08-12 NOTE — Progress Notes (Signed)
Lindsey Norris [birth year] [id number]        79 y.o.  G0P0 for followup exam.  Former patient of Dr. Eda Paschal. Has just initiated Prolia for osteoporosis and has received her second shot and did well with this.  Past medical history,surgical history, medications, allergies, family history and social history were all reviewed and documented in the EPIC chart.  ROS:  Performed and pertinent positives and negatives are included in the history, assessment and plan .  Exam: Kim assistant Filed Vitals:   08/12/12 1016  BP: 136/80  Height: 5\' 1"  (1.549 m)  Weight: 124 lb (56.246 kg)   General appearance  Normal Skin grossly normal Head/Neck normal with no cervical or supraclavicular adenopathy thyroid normal Lungs  clear Cardiac RR, without RMG Abdominal  soft, nontender, without masses, organomegaly or hernia Breasts  examined lying and sitting without masses, retractions, discharge or axillary adenopathy. Pelvic  Ext/BUS/vagina  atrophic changes. Rash upper inner right thigh.  Cervix  atrophic flush with the upper vagina, difficult to visualize do to vaginal stenosis.  Uterus grossly normal in size. Difficult to palpate.  Adnexa  Without masses or tenderness    Anus and perineum  normal   Rectovaginal  normal sphincter tone without palpated masses or tenderness.    Assessment/Plan:  79 y.o. G0P0 female for annual exam.   1. Osteoporosis. DEXA 10/2011 with T score -2.5 apparently worsening from prior studies. Had been on Fosamax several years ago but discontinued. Started on Prolia by Dr. Eda Paschal received her second injection doing well. She'll continue on this through next year and we'll repeat DEXA 2015 at a 2 year interval. Increased calcium vitamin D reviewed. 2. Atrophic vaginal changes. Asymptomatic. No issues with vaginal dryness or dyspareunia. 3. Rash upper inner right thigh. Thought to be secondary to heating pad use for arthritis. Diprolene 0.05% cream prescribed to apply  twice a day as needed. Followup if symptoms persist, worsen or recur. 4. Pap smear 2011. No Pap smear done today. No history of significant abnormal Pap smears. Discussed current screening guidelines and will stop screening and she is comfortable with this. 5. Mammography 10/2011. Continue with annual mammography. SBE monthly reviewed. 6. Colonoscopy 2009. Followup with their recommended interval. 7. Health maintenance. No lab work done as it is all done through her primary physician's office. Followup one year, sooner as needed.    Dara Lords MD, 11:19 AM 08/12/2012

## 2012-08-12 NOTE — Patient Instructions (Signed)
Apply steroid cream to the rash. Followup if symptoms persist, worsen or recur. Followup in one year for exam.

## 2012-08-13 LAB — URINALYSIS W MICROSCOPIC + REFLEX CULTURE
Bacteria, UA: NONE SEEN
Bilirubin Urine: NEGATIVE
Glucose, UA: NEGATIVE mg/dL
Hgb urine dipstick: NEGATIVE
Ketones, ur: NEGATIVE mg/dL
Protein, ur: NEGATIVE mg/dL
Urobilinogen, UA: 0.2 mg/dL (ref 0.0–1.0)

## 2012-11-12 ENCOUNTER — Ambulatory Visit (INDEPENDENT_AMBULATORY_CARE_PROVIDER_SITE_OTHER): Payer: Medicare PPO

## 2012-11-12 DIAGNOSIS — J309 Allergic rhinitis, unspecified: Secondary | ICD-10-CM

## 2012-11-17 ENCOUNTER — Other Ambulatory Visit: Payer: Self-pay | Admitting: Women's Health

## 2012-11-28 ENCOUNTER — Encounter: Payer: Self-pay | Admitting: Gynecology

## 2012-12-24 ENCOUNTER — Telehealth: Payer: Self-pay | Admitting: *Deleted

## 2012-12-24 NOTE — Telephone Encounter (Signed)
Called pt to set up next Prolia shot. Due 01/08/13. PA good til May 2015, copy in chart.  Husband stated not home to call after 4pmKW

## 2012-12-25 NOTE — Telephone Encounter (Signed)
Spoke with pt today, will come in NOv 17 at 4pm for her Prolia injection. Told her of her benefits. KW

## 2013-01-12 ENCOUNTER — Ambulatory Visit (INDEPENDENT_AMBULATORY_CARE_PROVIDER_SITE_OTHER): Payer: Medicare PPO | Admitting: *Deleted

## 2013-01-12 DIAGNOSIS — M810 Age-related osteoporosis without current pathological fracture: Secondary | ICD-10-CM

## 2013-01-12 MED ORDER — DENOSUMAB 60 MG/ML ~~LOC~~ SOLN
60.0000 mg | Freq: Once | SUBCUTANEOUS | Status: AC
  Administered 2013-01-12: 60 mg via SUBCUTANEOUS

## 2013-02-17 ENCOUNTER — Other Ambulatory Visit: Payer: Self-pay | Admitting: Women's Health

## 2013-02-24 ENCOUNTER — Encounter: Payer: Self-pay | Admitting: Internal Medicine

## 2013-02-24 ENCOUNTER — Ambulatory Visit (INDEPENDENT_AMBULATORY_CARE_PROVIDER_SITE_OTHER): Payer: Medicare PPO | Admitting: Internal Medicine

## 2013-02-24 VITALS — BP 118/70 | HR 94 | Ht 61.5 in | Wt 126.4 lb

## 2013-02-24 DIAGNOSIS — J449 Chronic obstructive pulmonary disease, unspecified: Secondary | ICD-10-CM

## 2013-02-24 DIAGNOSIS — J301 Allergic rhinitis due to pollen: Secondary | ICD-10-CM

## 2013-02-24 NOTE — Patient Instructions (Signed)
We can continue allergy vaccine 1:10 GO  Please call when ready to replace Epipen, as discussed

## 2013-02-24 NOTE — Progress Notes (Signed)
02/23/11- 79 yo f former smoker followed for asthma/ COPD, allergic rhinitis, GERD LOV-02/23/2010 Has had flu vaccine. She is continued allergy vaccine, giving her own with no problems and believes it has helped her. She never needs Proventil rescue inhaler and no longer has significant acute respiratory problems beyond an occasional cold. Nasal symptoms are not bothering her. We discussed stopping her allergy vaccine now and see how she does without it.  02/25/12- 47 yo f former smoker followed for asthma/ COPD, allergic rhinitis, GERD FOLLOWS FOR: still on vaccine-OV to continue vaccine and needs refilled; also needs Epipen refilled She wants to continue her allergy vaccine 1:10/ GO after stopping briefly. Has had no acute problems. We discussed continued use of allergy vaccine, goals, risks, realistic expectations. She needs EpiPen refill. Her primary physician gets her chest x-rays.  02/24/13- 40 yo F former smoker followed for asthma/ COPD, allergic rhinitis, GERD FOLLOWS FOR: still on Allergy vaccine 1:10 GO and doing well; Print Rx for Epipen as well. No acute issues. Breathing is comfortable. She states that allergy vaccine still helps and wants to continue. Her primary physician gets chest x-ray when needed.  ROS-see HPI Constitutional:   No-   weight loss, night sweats, fevers, chills, fatigue, lassitude. HEENT:   No-  headaches, difficulty swallowing, tooth/dental problems, sore throat,       No-  sneezing, itching, ear ache, nasal congestion, post nasal drip,  CV:  No-   chest pain, orthopnea, PND, swelling in lower extremities, anasarca, dizziness, palpitations Resp: No-   shortness of breath with exertion or at rest.              No-   productive cough,  No non-productive cough,  No- coughing up of blood.              No-   change in color of mucus.  No- wheezing.   Skin: No-   rash or lesions. GI:  No-   heartburn, indigestion, abdominal pain, nausea, vomiting,  GU: . MS:   No-   joint pain or swelling.   Neuro-     nothing unusual Psych:  No- change in mood or affect. No depression or anxiety.  No memory loss.  OBJ General- Alert, Oriented, Affect-appropriate, Distress- none acute Skin- rash-none, lesions- none, excoriation- none Lymphadenopathy- none Head- atraumatic            Eyes- Gross vision intact, PERRLA, conjunctivae clear secretions            Ears- Hearing, canals-normal            Nose- Clear, no-Septal dev, mucus, polyps, erosion, perforation             Throat- Mallampati II , mucosa clear , drainage- none, tonsils- atrophic Neck- flexible , trachea midline, no stridor , thyroid nl, carotid no bruit Chest - symmetrical excursion , unlabored           Heart/CV- RRR , no murmur , no gallop  , no rub, nl s1 s2                           - JVD- none , edema- none, stasis changes- none, varices- none           Lung- clear to P&A, wheeze- none, cough- none , dullness-none, rub- none           Chest wall-  Abd-  Br/ Gen/ Rectal- Not done, not  indicated Extrem- cyanosis- none, clubbing, none, atrophy- none, strength- nl Neuro- grossly intact to observation

## 2013-03-18 ENCOUNTER — Encounter: Payer: Self-pay | Admitting: Internal Medicine

## 2013-03-18 ENCOUNTER — Ambulatory Visit (INDEPENDENT_AMBULATORY_CARE_PROVIDER_SITE_OTHER): Payer: Medicare PPO

## 2013-03-18 DIAGNOSIS — J309 Allergic rhinitis, unspecified: Secondary | ICD-10-CM

## 2013-03-18 NOTE — Assessment & Plan Note (Signed)
We can continue allergy vaccine 1:10. We discussed risk benefit and realistic expectations. She will call for EpiPen refill when needed.

## 2013-03-18 NOTE — Assessment & Plan Note (Signed)
Good control

## 2013-07-07 ENCOUNTER — Telehealth: Payer: Self-pay | Admitting: *Deleted

## 2013-07-07 NOTE — Telephone Encounter (Signed)
Pt is due for Prolia after 5/17. Benefits requested. KW CMA

## 2013-07-13 NOTE — Telephone Encounter (Signed)
Prior authorization request faxed KW CMA

## 2013-07-21 ENCOUNTER — Ambulatory Visit (INDEPENDENT_AMBULATORY_CARE_PROVIDER_SITE_OTHER): Payer: Medicare PPO

## 2013-07-21 DIAGNOSIS — J309 Allergic rhinitis, unspecified: Secondary | ICD-10-CM

## 2013-07-27 NOTE — Telephone Encounter (Signed)
Authorization good through 07/17/13- 07/18/15 per Morrie Sheldon at [facility].  Pt informed and will come to office for injection 08/03/13 KW CMA

## 2013-08-03 ENCOUNTER — Ambulatory Visit (INDEPENDENT_AMBULATORY_CARE_PROVIDER_SITE_OTHER): Payer: Medicare PPO | Admitting: Gynecology

## 2013-08-03 DIAGNOSIS — M810 Age-related osteoporosis without current pathological fracture: Secondary | ICD-10-CM

## 2013-08-03 MED ORDER — DENOSUMAB 60 MG/ML ~~LOC~~ SOLN
60.0000 mg | Freq: Once | SUBCUTANEOUS | Status: AC
  Administered 2013-08-03: 60 mg via SUBCUTANEOUS

## 2013-08-20 ENCOUNTER — Encounter: Payer: Self-pay | Admitting: Gynecology

## 2013-08-20 ENCOUNTER — Ambulatory Visit (INDEPENDENT_AMBULATORY_CARE_PROVIDER_SITE_OTHER): Payer: Medicare PPO | Admitting: Gynecology

## 2013-08-20 VITALS — BP 148/82 | Ht 62.0 in | Wt 127.0 lb

## 2013-08-20 DIAGNOSIS — L940 Localized scleroderma [morphea]: Secondary | ICD-10-CM

## 2013-08-20 DIAGNOSIS — M810 Age-related osteoporosis without current pathological fracture: Secondary | ICD-10-CM

## 2013-08-20 DIAGNOSIS — N952 Postmenopausal atrophic vaginitis: Secondary | ICD-10-CM

## 2013-08-20 DIAGNOSIS — L900 Lichen sclerosus et atrophicus: Secondary | ICD-10-CM

## 2013-08-20 DIAGNOSIS — N907 Vulvar cyst: Secondary | ICD-10-CM

## 2013-08-20 DIAGNOSIS — N9089 Other specified noninflammatory disorders of vulva and perineum: Secondary | ICD-10-CM

## 2013-08-20 MED ORDER — CLOBETASOL PROPIONATE 0.05 % EX OINT: TOPICAL_OINTMENT | Freq: Every evening | CUTANEOUS | Status: DC | PRN

## 2013-08-20 NOTE — Patient Instructions (Signed)
Continue to receive your Prolia shots every 6 months. Followup for your bone density this coming fall.  Followup with me in one year. Sooner if any issues.  You may obtain a copy of any labs that were done today by logging onto MyChart as outlined in the instructions provided with your AVS (after visit summary). The office will not call with normal lab results but certainly if there are any significant abnormalities then we will contact you.   Health Maintenance, Female A healthy lifestyle and preventative care can promote health and wellness.  Maintain regular health, dental, and eye exams.  Eat a healthy diet. Foods like vegetables, fruits, whole grains, low-fat dairy products, and lean protein foods contain the nutrients you need without too many calories. Decrease your intake of foods high in solid fats, added sugars, and salt. Get information about a proper diet from your caregiver, if necessary.  Regular physical exercise is one of the most important things you can do for your health. Most adults should get at least 150 minutes of moderate-intensity exercise (any activity that increases your heart rate and causes you to sweat) each week. In addition, most adults need muscle-strengthening exercises on 2 or more days a week.   Maintain a healthy weight. The body mass index (BMI) is a screening tool to identify possible weight problems. It provides an estimate of body fat based on height and weight. Your caregiver can help determine your BMI, and can help you achieve or maintain a healthy weight. For adults 20 years and older:  A BMI below 18.5 is considered underweight.  A BMI of 18.5 to 24.9 is normal.  A BMI of 25 to 29.9 is considered overweight.  A BMI of 30 and above is considered obese.  Maintain normal blood lipids and cholesterol by exercising and minimizing your intake of saturated fat. Eat a balanced diet with plenty of fruits and vegetables. Blood tests for lipids and  cholesterol should begin at age 78 and be repeated every 5 years. If your lipid or cholesterol levels are high, you are over 50, or you are a high risk for heart disease, you may need your cholesterol levels checked more frequently.Ongoing high lipid and cholesterol levels should be treated with medicines if diet and exercise are not effective.  If you smoke, find out from your caregiver how to quit. If you do not use tobacco, do not start.  Lung cancer screening is recommended for adults aged 38 80 years who are at high risk for developing lung cancer because of a history of smoking. Yearly low-dose computed tomography (CT) is recommended for people who have at least a 30-pack-year history of smoking and are a current smoker or have quit within the past 15 years. A pack year of smoking is smoking an average of 1 pack of cigarettes a day for 1 year (for example: 1 pack a day for 30 years or 2 packs a day for 15 years). Yearly screening should continue until the smoker has stopped smoking for at least 15 years. Yearly screening should also be stopped for people who develop a health problem that would prevent them from having lung cancer treatment.  If you are pregnant, do not drink alcohol. If you are breastfeeding, be very cautious about drinking alcohol. If you are not pregnant and choose to drink alcohol, do not exceed 1 drink per day. One drink is considered to be 12 ounces (355 mL) of beer, 5 ounces (148 mL) of wine, or  1.5 ounces (44 mL) of liquor.  Avoid use of street drugs. Do not share needles with anyone. Ask for help if you need support or instructions about stopping the use of drugs.  High blood pressure causes heart disease and increases the risk of stroke. Blood pressure should be checked at least every 1 to 2 years. Ongoing high blood pressure should be treated with medicines, if weight loss and exercise are not effective.  If you are 45 to 78 years old, ask your caregiver if you should  take aspirin to prevent strokes.  Diabetes screening involves taking a blood sample to check your fasting blood sugar level. This should be done once every 3 years, after age 41, if you are within normal weight and without risk factors for diabetes. Testing should be considered at a younger age or be carried out more frequently if you are overweight and have at least 1 risk factor for diabetes.  Breast cancer screening is essential preventative care for women. You should practice "breast self-awareness." This means understanding the normal appearance and feel of your breasts and may include breast self-examination. Any changes detected, no matter how small, should be reported to a caregiver. Women in their 58s and 30s should have a clinical breast exam (CBE) by a caregiver as part of a regular health exam every 1 to 3 years. After age 31, women should have a CBE every year. Starting at age 15, women should consider having a mammogram (breast X-ray) every year. Women who have a family history of breast cancer should talk to their caregiver about genetic screening. Women at a high risk of breast cancer should talk to their caregiver about having an MRI and a mammogram every year.  Breast cancer gene (BRCA)-related cancer risk assessment is recommended for women who have family members with BRCA-related cancers. BRCA-related cancers include breast, ovarian, tubal, and peritoneal cancers. Having family members with these cancers may be associated with an increased risk for harmful changes (mutations) in the breast cancer genes BRCA1 and BRCA2. Results of the assessment will determine the need for genetic counseling and BRCA1 and BRCA2 testing.  The Pap test is a screening test for cervical cancer. Women should have a Pap test starting at age 70. Between ages 69 and 5, Pap tests should be repeated every 2 years. Beginning at age 64, you should have a Pap test every 3 years as long as the past 3 Pap tests have  been normal. If you had a hysterectomy for a problem that was not cancer or a condition that could lead to cancer, then you no longer need Pap tests. If you are between ages 9 and 88, and you have had normal Pap tests going back 10 years, you no longer need Pap tests. If you have had past treatment for cervical cancer or a condition that could lead to cancer, you need Pap tests and screening for cancer for at least 20 years after your treatment. If Pap tests have been discontinued, risk factors (such as a new sexual partner) need to be reassessed to determine if screening should be resumed. Some women have medical problems that increase the chance of getting cervical cancer. In these cases, your caregiver may recommend more frequent screening and Pap tests.  The human papillomavirus (HPV) test is an additional test that may be used for cervical cancer screening. The HPV test looks for the virus that can cause the cell changes on the cervix. The cells collected during the Pap  test can be tested for HPV. The HPV test could be used to screen women aged 63 years and older, and should be used in women of any age who have unclear Pap test results. After the age of 17, women should have HPV testing at the same frequency as a Pap test.  Colorectal cancer can be detected and often prevented. Most routine colorectal cancer screening begins at the age of 31 and continues through age 90. However, your caregiver may recommend screening at an earlier age if you have risk factors for colon cancer. On a yearly basis, your caregiver may provide home test kits to check for hidden blood in the stool. Use of a small camera at the end of a tube, to directly examine the colon (sigmoidoscopy or colonoscopy), can detect the earliest forms of colorectal cancer. Talk to your caregiver about this at age 19, when routine screening begins. Direct examination of the colon should be repeated every 5 to 10 years through age 21, unless early  forms of pre-cancerous polyps or small growths are found.  Hepatitis C blood testing is recommended for all people born from [birth year] through 1963 and any individual with known risks for hepatitis C.  Practice safe sex. Use condoms and avoid high-risk sexual practices to reduce the spread of sexually transmitted infections (STIs). Sexually active women aged 12 and younger should be checked for Chlamydia, which is a common sexually transmitted infection. Older women with new or multiple partners should also be tested for Chlamydia. Testing for other STIs is recommended if you are sexually active and at increased risk.  Osteoporosis is a disease in which the bones lose minerals and strength with aging. This can result in serious bone fractures. The risk of osteoporosis can be identified using a bone density scan. Women ages 16 and over and women at risk for fractures or osteoporosis should discuss screening with their caregivers. Ask your caregiver whether you should be taking a calcium supplement or vitamin D to reduce the rate of osteoporosis.  Menopause can be associated with physical symptoms and risks. Hormone replacement therapy is available to decrease symptoms and risks. You should talk to your caregiver about whether hormone replacement therapy is right for you.  Use sunscreen. Apply sunscreen liberally and repeatedly throughout the day. You should seek shade when your shadow is shorter than you. Protect yourself by wearing long sleeves, pants, a wide-brimmed hat, and sunglasses year round, whenever you are outdoors.  Notify your caregiver of new moles or changes in moles, especially if there is a change in shape or color. Also notify your caregiver if a mole is larger than the size of a pencil eraser.  Stay current with your immunizations. Document Released: 08/28/2010 Document Revised: 06/09/2012 Document Reviewed: 08/28/2010 [facility] Patient Information 2014 [place].

## 2013-08-20 NOTE — Progress Notes (Signed)
Lindsey Norris [birth year] [id number]        80 y.o.  G0P0 for followup exam  Past medical history,surgical history, problem list, medications, allergies, family history and social history were all reviewed and documented as reviewed in the EPIC chart.  ROS:  12 system ROS performed with pertinent positives and negatives included in the history, assessment and plan.    Additional significant findings :  None   Exam: [facility] personassistant Filed Vitals:   08/20/13 1002  BP: 148/82  Height: 5\' 2"  (1.575 m)  Weight: 127 lb (57.607 kg)   General appearance:  Normal affect, orientation and appearance. Skin: Grossly normal HEENT: Without gross lesions.  No cervical or supraclavicular adenopathy. Thyroid normal.  Lungs:  Clear without wheezing, rales or rhonchi Cardiac: RR, without RMG Abdominal:  Soft, nontender, without masses, guarding, rebound, organomegaly or hernia Breasts:  Examined lying and sitting without masses, retractions, discharge or axillary adenopathy. Pelvic:  Ext/BUS/vagina with generalized atrophic changes. Small classic sebaceous cyst lower left lateral labia majora  Cervix atrophic flush with the upper vagina, difficult to visualize due to vaginal stenosis.  Uterus grossly normal in size midline mobile nontender  Adnexa  Without masses or tenderness    Anus and perineum  Normal   Rectovaginal  Normal sphincter tone without palpated masses or tenderness.    Assessment/Plan:  80 y.o. G0P0 female for followup exam.   1. Osteoporosis. DEXA 10/2011 T score -2.5. Patient continues on Prolia for 2 years now doing well. Due for DEXA this coming fall. Will continue on Prolia for now and repeat DEXA this fall. Increase calcium vitamin D reviewed. 2. Lichen sclerosus. History of lichen sclerosus using Temovate 0.05% cream intermittently with good symptom relief. Reviewed with her the need to avoid using every night but to use for intermittent bouts of symptoms. Refill provided for  her. 3. Small benign appearing sebaceous cyst left lateral lower labia majora. Patient states it has been present for years unchanged. Continue to monitor. 4. Postmenopausal/atrophic genital changes. Without significant hot flushes, night sweats, vaginal dryness. Is not sexually active. No history of vaginal bleeding. Continue to monitor and report any vaginal bleeding. 5. Pap smear 2011. No Pap smear done today. Over the age of 78 with no history of abnormal Pap smears previously. We both agree to stop screening per current screening guidelines. 6. Mammography 11/2012. Continue with annual mammography. SBE monthly review. 7. Colonoscopy 2009. Repeat at their recommended interval. 8. Health maintenance. No blood work done as this is all done through her primary physician's office. Followup one year, sooner as needed.   Note: This document was prepared with digital dictation and possible smart phrase technology. Any transcriptional errors that result from this process are unintentional.   Dara LordsFONTAINE,TIMOTHY P MD, 10:58 AM 08/20/2013

## 2013-09-01 ENCOUNTER — Ambulatory Visit: Payer: Medicare PPO

## 2013-09-01 ENCOUNTER — Ambulatory Visit (INDEPENDENT_AMBULATORY_CARE_PROVIDER_SITE_OTHER): Payer: Medicare PPO | Admitting: Emergency Medicine

## 2013-09-01 VITALS — BP 180/85 | HR 92 | Temp 98.0°F | Resp 16 | Ht 60.5 in | Wt 126.8 lb

## 2013-09-01 DIAGNOSIS — S8010XA Contusion of unspecified lower leg, initial encounter: Secondary | ICD-10-CM

## 2013-09-01 DIAGNOSIS — S8012XA Contusion of left lower leg, initial encounter: Secondary | ICD-10-CM

## 2013-09-01 NOTE — Patient Instructions (Signed)
Contusion °A contusion is a deep bruise. Contusions are the result of an injury that caused bleeding under the skin. The contusion may turn blue, purple, or yellow. Minor injuries will give you a painless contusion, but more severe contusions may stay painful and swollen for a few weeks.  °CAUSES  °A contusion is usually caused by a blow, trauma, or direct force to an area of the body. °SYMPTOMS  °· Swelling and redness of the injured area. °· Bruising of the injured area. °· Tenderness and soreness of the injured area. °· Pain. °DIAGNOSIS  °The diagnosis can be made by taking a history and physical exam. An X-ray, CT scan, or MRI may be needed to determine if there were any associated injuries, such as fractures. °TREATMENT  °Specific treatment will depend on what area of the body was injured. In general, the best treatment for a contusion is resting, icing, elevating, and applying cold compresses to the injured area. Over-the-counter medicines may also be recommended for pain control. Ask your caregiver what the best treatment is for your contusion. °HOME CARE INSTRUCTIONS  °· Put ice on the injured area. °¨ Put ice in a plastic bag. °¨ Place a towel between your skin and the bag. °¨ Leave the ice on for 15-20 minutes, 3-4 times a day, or as directed by your health care provider. °· Only take over-the-counter or prescription medicines for pain, discomfort, or fever as directed by your caregiver. Your caregiver may recommend avoiding anti-inflammatory medicines (aspirin, ibuprofen, and naproxen) for 48 hours because these medicines may increase bruising. °· Rest the injured area. °· If possible, elevate the injured area to reduce swelling. °SEEK IMMEDIATE MEDICAL CARE IF:  °· You have increased bruising or swelling. °· You have pain that is getting worse. °· Your swelling or pain is not relieved with medicines. °MAKE SURE YOU:  °· Understand these instructions. °· Will watch your condition. °· Will get help right  away if you are not doing well or get worse. °Document Released: 11/22/2004 Document Revised: 02/17/2013 Document Reviewed: 12/18/2010 °[employer]® Patient Information ©2015 [employer]. This information is not intended to replace advice given to you by your health care provider. Make sure you discuss any questions you have with your health care provider. ° °

## 2013-09-01 NOTE — Progress Notes (Signed)
[facility] [address], [place] [id number] [phone]  Date:  09/01/2013   Name:  Lindsey Norris   DOB:  11-15-1926   MRN:  [id number]  PCP:  Gaspar GarbeISOVEC,RICHARD W, MD    Chief Complaint: Fall   History of Present Illness:  Lindsey Norris is a 80 y.o. very pleasant female patient who presents with the following:  Injured yesterday and injured her left shin.  Has pain particularly with ambulation .  Moderate ecchymosis of leg.  Has arthritis.  Some swelling of lowers.  No improvement with over the counter medications or other home remedies. Denies other complaint or health concern today.   Patient Active Problem List   Diagnosis Date Noted  . Arthritis   . Allergic rhinitis due to pollen 05/09/2010  . DYSLIPIDEMIA 06/11/2007  . HYPERTENSION 06/11/2007  . HEMORRHOIDS 06/11/2007  . COPD, MILD 06/11/2007  . OSTEOARTHRITIS 06/11/2007  . Personal history of colonic polyps 01/15/2007  . ALLERGIC ASTHMA 01/15/2007  . GERD 01/15/2007  . GASTRITIS, CHRONIC 04/22/2001    Past Medical History  Diagnosis Date  . Sclerosis of the skin     LICHENS SCLEROSIS  . Hypertension   . Asthma   . Hypercholesteremia   . GERD (gastroesophageal reflux disease)   . Arthritis   . Duodenitis without hemorrhage   . Chronic gastritis   . Hemorrhoids, internal     & external  . Colon polyp     dimnutive 2mm  . Osteoporosis 2013    T score -2.5    Past Surgical History  Procedure Laterality Date  . Appendectomy    . Nose surgery      DEVIATED SEPTUM REPAIR  . Dilation and curettage of uterus  1969  . Cataract extraction      bilateral x 2  . Refractive surgery      LASER EYE SURGERY X2/ 2010 AND 2011  . Colonoscopy    . Esophagogastroduodenoscopy      History  Substance Use Topics  . Smoking status: Former Smoker -- 0.10 packs/day for 4 years    Types: Cigarettes    Quit date: 02/27/1956  . Smokeless tobacco: Not on file  . Alcohol Use: Yes   Comment: Rare    Family History  Problem Relation Age of Onset  . Hypertension Mother   . Heart disease Father   . Uterine cancer Paternal Aunt   . Diabetes Maternal Grandfather   . Stomach cancer Paternal Grandfather   . Colon cancer Neg Hx     Allergies  Allergen Reactions  . Sulfonamide Derivatives     REACTION: vomiting    Medication list has been reviewed and updated.  Current Outpatient Prescriptions on File Prior to Visit  Medication Sig Dispense Refill  . calcium carbonate (TUMS - DOSED IN MG ELEMENTAL CALCIUM) 500 MG chewable tablet Chew 3 tablets by mouth daily.       . Celecoxib (CELEBREX PO) Take by mouth.      . clobetasol ointment (TEMOVATE) 0.05 % Apply topically at bedtime as needed. Not to use for extended periods of time  15 g  2  . denosumab (PROLIA) 60 MG/ML SOLN injection Inject 60 mg into the skin every 6 (six) months. Administer in upper arm, thigh, or abdomen      . Multiple Vitamin (MULTIVITAMIN) capsule Take 1 capsule by mouth daily.        [name]. NEXIUM 40 MG capsule TAKE 1 CAPSULE EVERY  DAY 30 MINUTES BEFORE A MEAL  30 capsule  8  . PRESCRIPTION MEDICATION Allergy injection q. week      . traMADol (ULTRAM) 50 MG tablet Take 50 mg by mouth every 6 (six) hours as needed. 08/08/10 taking due to current issues with back.      . triamterene-hydrochlorothiazide (DYAZIDE) 37.5-25 MG per capsule Take 1 capsule by mouth.         No current facility-administered medications on file prior to visit.    Review of Systems:  As per HPI, otherwise negative.    Physical Examination: Filed Vitals:   09/01/13 0952  BP: 180/85  Pulse: 92  Temp: 98 F (36.7 C)  Resp: 16   Filed Vitals:   09/01/13 0952  Height: 5' 0.5" (1.537 m)  Weight: 126 lb 12.8 oz (57.516 kg)   Body mass index is 24.35 kg/(m^2). Ideal Body Weight: Weight in (lb) to have BMI = 25: 129.9   GEN: WDWN, NAD, Non-toxic, Alert & Oriented x 3 HEENT: Atraumatic, Normocephalic.  Ears and Nose:  No external deformity. EXTR: No clubbing/cyanosis/edema NEURO: Normal gait.  PSYCH: Normally interactive. Conversant. Not depressed or anxious appearing.  Calm demeanor.  Left leg:  Large ecchymosis Left knee Full AROM   Assessment and Plan: Contusion leg  Signed,  Phillips OdorJeffery Amed Datta, MD   [facility] reading (PRIMARY) by  Dr. Dareen PianoAnderson.  Soft tissue swelling.

## 2013-09-23 ENCOUNTER — Other Ambulatory Visit: Payer: Self-pay | Admitting: Internal Medicine

## 2013-11-24 ENCOUNTER — Encounter: Payer: Self-pay | Admitting: Gynecology

## 2013-11-30 ENCOUNTER — Ambulatory Visit (INDEPENDENT_AMBULATORY_CARE_PROVIDER_SITE_OTHER): Payer: Medicare PPO

## 2013-11-30 DIAGNOSIS — J309 Allergic rhinitis, unspecified: Secondary | ICD-10-CM

## 2013-12-07 ENCOUNTER — Encounter: Payer: Self-pay | Admitting: Gynecology

## 2013-12-31 ENCOUNTER — Encounter: Payer: Self-pay | Admitting: Internal Medicine

## 2014-01-12 ENCOUNTER — Telehealth: Payer: Self-pay | Admitting: *Deleted

## 2014-01-12 NOTE — Telephone Encounter (Signed)
Pt was called time for Prolia. [employer] benefits checked, 100% covered for injection with medical assistant; if she were to schedule an apt with a provider then there would a $40 copay. Pt will call back to schedule Prolia injection. To be injected after 02/02/14. Kw CMA/PF

## 2014-02-09 ENCOUNTER — Ambulatory Visit (INDEPENDENT_AMBULATORY_CARE_PROVIDER_SITE_OTHER): Payer: Medicare PPO | Admitting: Gynecology

## 2014-02-09 DIAGNOSIS — M810 Age-related osteoporosis without current pathological fracture: Secondary | ICD-10-CM

## 2014-02-09 MED ORDER — DENOSUMAB 60 MG/ML ~~LOC~~ SOLN
60.0000 mg | Freq: Once | SUBCUTANEOUS | Status: AC
  Administered 2014-02-09: 60 mg via SUBCUTANEOUS

## 2014-02-24 ENCOUNTER — Encounter: Payer: Self-pay | Admitting: Internal Medicine

## 2014-02-24 ENCOUNTER — Ambulatory Visit (INDEPENDENT_AMBULATORY_CARE_PROVIDER_SITE_OTHER): Payer: Medicare PPO | Admitting: Internal Medicine

## 2014-02-24 VITALS — BP 134/82 | HR 104 | Ht 61.5 in | Wt 130.0 lb

## 2014-02-24 DIAGNOSIS — J4520 Mild intermittent asthma, uncomplicated: Secondary | ICD-10-CM

## 2014-02-24 DIAGNOSIS — J301 Allergic rhinitis due to pollen: Secondary | ICD-10-CM

## 2014-02-24 MED ORDER — EPINEPHRINE 0.3 MG/0.3ML IJ SOAJ: INTRAMUSCULAR | Status: AC

## 2014-02-24 NOTE — Patient Instructions (Signed)
We can continue allergy vaccine 1:10 GO  Refill script to keep Epipen on hand in case of severe allergic reaction  Please call if we can help

## 2014-02-24 NOTE — Progress Notes (Signed)
02/23/11- 80 yo f former smoker followed for asthma/ COPD, allergic rhinitis, GERD LOV-02/23/2010 Has had flu vaccine. She is continued allergy vaccine, giving her own with no problems and believes it has helped her. She never needs Proventil rescue inhaler and no longer has significant acute respiratory problems beyond an occasional cold. Nasal symptoms are not bothering her. We discussed stopping her allergy vaccine now and see how she does without it.  02/25/12- 80 yo f former smoker followed for asthma/ COPD, allergic rhinitis, GERD FOLLOWS FOR: still on vaccine-OV to continue vaccine and needs refilled; also needs Epipen refilled She wants to continue her allergy vaccine 1:10/ GO after stopping briefly. Has had no acute problems. We discussed continued use of allergy vaccine, goals, risks, realistic expectations. She needs EpiPen refill. Her primary physician gets her chest x-rays.  02/24/13- 80 yo F former smoker followed for asthma/ COPD, allergic rhinitis, GERD FOLLOWS FOR: still on Allergy vaccine 1:10 GO and doing well; Print Rx for Epipen as well. No acute issues. Breathing is comfortable. She states that allergy vaccine still helps and wants to continue. Her primary physician gets chest x-ray when needed.  02/24/14- 80 yo F former smoker followed for asthma/ COPD, allergic rhinitis, GERD FOLLOWS FOR: pt has no respiratory complaints at this time.  pt tolerating allergy injections well.  Allergy Vaccine 1:10 GO. She says she has tried without allergy shots and "they work better".  ROS-see HPI Constitutional:   No-   weight loss, night sweats, fevers, chills, fatigue, lassitude. HEENT:   No-  headaches, difficulty swallowing, tooth/dental problems, sore throat,       No-  sneezing, itching, ear ache, nasal congestion, post nasal drip,  CV:  No-   chest pain, orthopnea, PND, swelling in lower extremities, anasarca, dizziness, palpitations Resp: No-   shortness of breath with exertion  or at rest.              No-   productive cough,  No non-productive cough,  No- coughing up of blood.              No-   change in color of mucus.  No- wheezing.   Skin: No-   rash or lesions. GI:  No-   heartburn, indigestion, abdominal pain, nausea, vomiting,  GU: . MS:  No-   joint pain or swelling.   Neuro-     nothing unusual Psych:  No- change in mood or affect. No depression or anxiety.  No memory loss.  OBJ General- Alert, Oriented, Affect-appropriate, Distress- none acute Skin- rash-none, lesions- none, excoriation- none Lymphadenopathy- none Head- atraumatic            Eyes- Gross vision intact, PERRLA, conjunctivae clear secretions            Ears- Hearing, canals-normal            Nose- Clear, no-Septal dev, mucus, polyps, erosion, perforation             Throat- Mallampati II , mucosa clear , drainage- none, tonsils- atrophic Neck- flexible , trachea midline, no stridor , thyroid nl, carotid no bruit Chest - symmetrical excursion , unlabored           Heart/CV- RRR , no murmur , no gallop  , no rub, nl s1 s2                           - JVD- none , edema- none, stasis  changes- none, varices- none           Lung- clear to P&A, wheeze- none, cough- none , dullness-none, rub- none           Chest wall-  Abd-  Br/ Gen/ Rectal- Not done, not indicated Extrem- cyanosis- none, clubbing, none, atrophy- none, strength- nl Neuro- grossly intact to observation

## 2014-02-26 NOTE — Assessment & Plan Note (Signed)
Occasional slight cough but no significant exacerbation.

## 2014-02-26 NOTE — Assessment & Plan Note (Signed)
Seasonal and perennial allergic rhinitis. Well controlled and she credits allergy vaccine. I explained that atopic responses have usually faded significantly by her age and that irritant responses are more common. She is reluctant to stop vaccine. Plan-continue allergy vaccine. Refill EpiPen with discussion.

## 2014-04-02 ENCOUNTER — Ambulatory Visit (INDEPENDENT_AMBULATORY_CARE_PROVIDER_SITE_OTHER): Payer: Medicare PPO

## 2014-04-02 DIAGNOSIS — J309 Allergic rhinitis, unspecified: Secondary | ICD-10-CM

## 2014-04-05 DIAGNOSIS — H52203 Unspecified astigmatism, bilateral: Secondary | ICD-10-CM | POA: Diagnosis not present

## 2014-04-05 DIAGNOSIS — H3531 Nonexudative age-related macular degeneration: Secondary | ICD-10-CM | POA: Diagnosis not present

## 2014-04-05 DIAGNOSIS — Z961 Presence of intraocular lens: Secondary | ICD-10-CM | POA: Diagnosis not present

## 2014-06-17 ENCOUNTER — Telehealth: Payer: Self-pay | Admitting: Gynecology

## 2014-06-17 DIAGNOSIS — M810 Age-related osteoporosis without current pathological fracture: Secondary | ICD-10-CM

## 2014-06-17 NOTE — Telephone Encounter (Addendum)
Phone call to patient, Prolia due after 08/13/2014. [employer] insurance coverage is No deductible, $40 co-pay. OOP MAX $4000 ($40 met). prolia cost $ 0 to patient. PA needed Ref #  [id number]    07/18/14  Thru   07/18/15  , reference # for benefits [id number] .   Appointment annual with Dr Phineas Real 08/26/2014. She may have injection at time of annual. Patient had Calcium level  9.3 at PCP Drenda Freeze, MD) on 01/11/2014 documented on [email or web address].

## 2014-07-28 DIAGNOSIS — M5136 Other intervertebral disc degeneration, lumbar region: Secondary | ICD-10-CM | POA: Diagnosis not present

## 2014-08-03 ENCOUNTER — Telehealth: Payer: Self-pay | Admitting: Internal Medicine

## 2014-08-03 ENCOUNTER — Ambulatory Visit (INDEPENDENT_AMBULATORY_CARE_PROVIDER_SITE_OTHER): Payer: Medicare PPO

## 2014-08-03 DIAGNOSIS — J309 Allergic rhinitis, unspecified: Secondary | ICD-10-CM

## 2014-08-12 DIAGNOSIS — E785 Hyperlipidemia, unspecified: Secondary | ICD-10-CM | POA: Diagnosis not present

## 2014-08-12 DIAGNOSIS — Z5189 Encounter for other specified aftercare: Secondary | ICD-10-CM | POA: Diagnosis not present

## 2014-08-12 DIAGNOSIS — M150 Primary generalized (osteo)arthritis: Secondary | ICD-10-CM | POA: Diagnosis not present

## 2014-08-12 DIAGNOSIS — J309 Allergic rhinitis, unspecified: Secondary | ICD-10-CM | POA: Diagnosis not present

## 2014-08-12 DIAGNOSIS — M47817 Spondylosis without myelopathy or radiculopathy, lumbosacral region: Secondary | ICD-10-CM | POA: Diagnosis not present

## 2014-08-12 DIAGNOSIS — K219 Gastro-esophageal reflux disease without esophagitis: Secondary | ICD-10-CM | POA: Diagnosis not present

## 2014-08-12 DIAGNOSIS — I517 Cardiomegaly: Secondary | ICD-10-CM | POA: Diagnosis not present

## 2014-08-12 DIAGNOSIS — I10 Essential (primary) hypertension: Secondary | ICD-10-CM | POA: Diagnosis not present

## 2014-08-12 DIAGNOSIS — D692 Other nonthrombocytopenic purpura: Secondary | ICD-10-CM | POA: Diagnosis not present

## 2014-08-26 ENCOUNTER — Encounter: Payer: Self-pay | Admitting: Gynecology

## 2014-08-26 ENCOUNTER — Ambulatory Visit (INDEPENDENT_AMBULATORY_CARE_PROVIDER_SITE_OTHER): Payer: Medicare PPO | Admitting: Gynecology

## 2014-08-26 VITALS — BP 130/84 | Ht 61.0 in | Wt 127.0 lb

## 2014-08-26 DIAGNOSIS — M810 Age-related osteoporosis without current pathological fracture: Secondary | ICD-10-CM

## 2014-08-26 DIAGNOSIS — N952 Postmenopausal atrophic vaginitis: Secondary | ICD-10-CM | POA: Diagnosis not present

## 2014-08-26 DIAGNOSIS — L900 Lichen sclerosus et atrophicus: Secondary | ICD-10-CM | POA: Diagnosis not present

## 2014-08-26 DIAGNOSIS — Z01419 Encounter for gynecological examination (general) (routine) without abnormal findings: Secondary | ICD-10-CM | POA: Diagnosis not present

## 2014-08-26 MED ORDER — DENOSUMAB 60 MG/ML ~~LOC~~ SOLN
60.0000 mg | Freq: Once | SUBCUTANEOUS | Status: AC
Start: 2014-08-26 — End: 2014-08-26
  Administered 2014-08-26: 60 mg via SUBCUTANEOUS

## 2014-08-26 NOTE — Addendum Note (Signed)
Addended by: Dayna BarkerGARDNER, KIMBERLY K on: 08/26/2014 12:52 PM   Modules accepted: Orders

## 2014-08-26 NOTE — Patient Instructions (Signed)
Follow-up in 6 months for your next Prolia shot 

## 2014-08-26 NOTE — Progress Notes (Signed)
Lindsey Norris Jun 07, 1926 [id number]        81 y.o.  G0P0 for breast and pelvic exam. Also due for her Prolia shot.  Past medical history,surgical history, problem list, medications, allergies, family history and social history were all reviewed and documented as reviewed in the EPIC chart.  ROS:  Performed with pertinent positives and negatives included in the history, assessment and plan.   Additional significant findings :  none   Exam: Lindsey Norris [facility] personassistant Filed Vitals:   08/26/14 1015  BP: 130/84  Height: 5\' 1"  (1.549 m)  Weight: 127 lb (57.607 kg)   General appearance:  Normal affect, orientation and appearance. Skin: Grossly normal HEENT: Without gross lesions.  No cervical or supraclavicular adenopathy. Thyroid normal.  Lungs:  Clear without wheezing, rales or rhonchi Cardiac: RR, without RMG Abdominal:  Soft, nontender, without masses, guarding, rebound, organomegaly or hernia Breasts:  Examined lying and sitting without masses, retractions, discharge or axillary adenopathy. Pelvic:  Ext/BUS/vagina with atrophic changes  Cervix difficult to visualize due to her vaginal stenosis  Uterus difficult to palpate but no gross masses or tenderness  Adnexa  Without gross masses or tenderness    Anus and perineum  Normal   Rectovaginal  Normal sphincter tone without palpated masses or tenderness.    Assessment/Plan:  81 y.o. G0P0 female for breast and pelvic exam.   1. Post menopausal/atrophic genital changes. Patient without significant hot flashes, night sweats, vaginal dryness or any vaginal bleeding.  Patient knows to report any vaginal bleeding. 2. Osteoporosis. DEXA 2015 T score -2.5. Stable overall from prior DEXA 2013. Has been on Prolia now for 3 years. Receive her shot today and will plan on continuing through her next DEXA next year. 3. Lichen sclerosis. Uses occasional Temovate with good response. Will call when she needs more. 4. Mammography 11/2013. Continue with annual  mammography. SBE monthly reviewed. 5. Colonoscopy 2009. Repeat at their recommended interval. 6. Pap smear 2011. No Pap smear done today.  No history of significant abnormal Pap smears previously. We both agree to stop screening per current screening guidelines. 7. Health maintenance. No routine lab work done as patient reports this done at her primary physician's office. Follow up in 6 months for next Prolia, 1 year for next exam.     Lindsey Norris P MD, 12:33 PM 08/26/2014

## 2014-08-31 NOTE — Telephone Encounter (Signed)
Date Mixed: 08/03/14 Vial: 1 Strength: 1:10 Here/Mail/Pick Up: mail Mixed By: Darlyne Russiantbs

## 2014-08-31 NOTE — Telephone Encounter (Signed)
Received injection Prolia 08/26/2014 . Next injection due after February 27 2015. She will need repeat Calcium also if not done at PCP.

## 2014-09-30 DIAGNOSIS — M5136 Other intervertebral disc degeneration, lumbar region: Secondary | ICD-10-CM | POA: Diagnosis not present

## 2014-09-30 DIAGNOSIS — M47816 Spondylosis without myelopathy or radiculopathy, lumbar region: Secondary | ICD-10-CM | POA: Diagnosis not present

## 2014-10-01 ENCOUNTER — Other Ambulatory Visit: Payer: Self-pay | Admitting: Gynecology

## 2014-10-15 ENCOUNTER — Encounter: Payer: Self-pay | Admitting: Internal Medicine

## 2014-11-09 DIAGNOSIS — M47817 Spondylosis without myelopathy or radiculopathy, lumbosacral region: Secondary | ICD-10-CM | POA: Diagnosis not present

## 2014-12-07 DIAGNOSIS — Z1231 Encounter for screening mammogram for malignant neoplasm of breast: Secondary | ICD-10-CM | POA: Diagnosis not present

## 2014-12-09 ENCOUNTER — Encounter: Payer: Self-pay | Admitting: Gynecology

## 2014-12-20 DIAGNOSIS — M5136 Other intervertebral disc degeneration, lumbar region: Secondary | ICD-10-CM | POA: Diagnosis not present

## 2014-12-20 DIAGNOSIS — M150 Primary generalized (osteo)arthritis: Secondary | ICD-10-CM | POA: Diagnosis not present

## 2014-12-20 DIAGNOSIS — M810 Age-related osteoporosis without current pathological fracture: Secondary | ICD-10-CM | POA: Diagnosis not present

## 2014-12-29 ENCOUNTER — Telehealth: Payer: Self-pay | Admitting: Internal Medicine

## 2014-12-29 NOTE — Telephone Encounter (Signed)
Spoke with pt, states she sent an allergy serum request via mail last week and has not yet received her serum.    Tammy please advise.  Thanks!

## 2014-12-29 NOTE — Telephone Encounter (Signed)
Called pt. And lmomtcb. I gave her my direct #. Waiting to hear from her.

## 2014-12-30 NOTE — Telephone Encounter (Signed)
Called pt. A lil' after 5:00, because I hadn't heard from her. Vac. Was lost in the mail. As soon as I get her vac. Made I am going to call her and let her know it's ready to pick-up. Nothing further needed.

## 2014-12-30 NOTE — Telephone Encounter (Signed)
I need to talk to her that's why I gave her my # [phone]) and asked her to call. Please either call her back and transfer her to [phone] or have her call me.

## 2014-12-30 NOTE — Telephone Encounter (Signed)
LVM for pt to call [phone] and ask for Tammy

## 2014-12-30 NOTE — Telephone Encounter (Signed)
Called and spoke with pt she stated that she received VM on 12/29/14 but has not received allergy serum. Tammy, has serum been mailed?

## 2014-12-31 ENCOUNTER — Telehealth: Payer: Self-pay | Admitting: Internal Medicine

## 2014-12-31 NOTE — Telephone Encounter (Signed)
Allergy Serum Extract Date Mixed: 12/31/14 Vial: 1 Strength: 1:10 Here/Mail/Pick Up: pick up Mixed By: tbs

## 2015-01-03 ENCOUNTER — Ambulatory Visit (INDEPENDENT_AMBULATORY_CARE_PROVIDER_SITE_OTHER): Payer: Medicare PPO

## 2015-01-03 ENCOUNTER — Encounter: Payer: Self-pay | Admitting: Internal Medicine

## 2015-01-03 DIAGNOSIS — J309 Allergic rhinitis, unspecified: Secondary | ICD-10-CM | POA: Diagnosis not present

## 2015-01-06 ENCOUNTER — Encounter: Payer: Self-pay | Admitting: Gastroenterology

## 2015-01-06 ENCOUNTER — Encounter: Payer: Self-pay | Admitting: Internal Medicine

## 2015-01-25 DIAGNOSIS — M810 Age-related osteoporosis without current pathological fracture: Secondary | ICD-10-CM | POA: Diagnosis not present

## 2015-01-25 DIAGNOSIS — I10 Essential (primary) hypertension: Secondary | ICD-10-CM | POA: Diagnosis not present

## 2015-01-25 DIAGNOSIS — E785 Hyperlipidemia, unspecified: Secondary | ICD-10-CM | POA: Diagnosis not present

## 2015-02-01 DIAGNOSIS — M810 Age-related osteoporosis without current pathological fracture: Secondary | ICD-10-CM | POA: Diagnosis not present

## 2015-02-01 DIAGNOSIS — D638 Anemia in other chronic diseases classified elsewhere: Secondary | ICD-10-CM | POA: Diagnosis not present

## 2015-02-01 DIAGNOSIS — I10 Essential (primary) hypertension: Secondary | ICD-10-CM | POA: Diagnosis not present

## 2015-02-01 DIAGNOSIS — Z23 Encounter for immunization: Secondary | ICD-10-CM | POA: Diagnosis not present

## 2015-02-01 DIAGNOSIS — J309 Allergic rhinitis, unspecified: Secondary | ICD-10-CM | POA: Diagnosis not present

## 2015-02-01 DIAGNOSIS — E7800 Pure hypercholesterolemia, unspecified: Secondary | ICD-10-CM | POA: Diagnosis not present

## 2015-02-01 DIAGNOSIS — K219 Gastro-esophageal reflux disease without esophagitis: Secondary | ICD-10-CM | POA: Diagnosis not present

## 2015-02-01 DIAGNOSIS — I517 Cardiomegaly: Secondary | ICD-10-CM | POA: Diagnosis not present

## 2015-02-01 DIAGNOSIS — Z0000 Encounter for general adult medical examination without abnormal findings: Secondary | ICD-10-CM | POA: Diagnosis not present

## 2015-02-01 DIAGNOSIS — J449 Chronic obstructive pulmonary disease, unspecified: Secondary | ICD-10-CM | POA: Diagnosis not present

## 2015-02-01 DIAGNOSIS — Z1389 Encounter for screening for other disorder: Secondary | ICD-10-CM | POA: Diagnosis not present

## 2015-02-02 ENCOUNTER — Telehealth: Payer: Self-pay | Admitting: Gynecology

## 2015-02-02 NOTE — Telephone Encounter (Signed)
Phone call to pt regarding Prolia is due after 02/27/15. She will also need Calcium level PCP MD. Pt states she has new insurance card and will mail in a copy of card to office.

## 2015-02-25 ENCOUNTER — Ambulatory Visit (INDEPENDENT_AMBULATORY_CARE_PROVIDER_SITE_OTHER): Payer: Medicare PPO | Admitting: Internal Medicine

## 2015-02-25 ENCOUNTER — Encounter: Payer: Self-pay | Admitting: Internal Medicine

## 2015-02-25 VITALS — BP 124/80 | HR 93 | Ht 61.5 in | Wt 120.8 lb

## 2015-02-25 DIAGNOSIS — J301 Allergic rhinitis due to pollen: Secondary | ICD-10-CM

## 2015-02-25 DIAGNOSIS — J4520 Mild intermittent asthma, uncomplicated: Secondary | ICD-10-CM | POA: Diagnosis not present

## 2015-02-25 NOTE — Assessment & Plan Note (Signed)
She wishes to continue allergy vaccine. We discussed getting inhalers refilled. She almost never uses them and it may not be cost effective.

## 2015-02-25 NOTE — Patient Instructions (Addendum)
We can continue allergy vaccine 1:10 GO for another year  Please call if you have questions or concerns

## 2015-02-25 NOTE — Progress Notes (Signed)
02/23/11- 81 yo f former smoker followed for asthma/ COPD, allergic rhinitis, GERD LOV-02/23/2010 Has had flu vaccine. She is continued allergy vaccine, giving her own with no problems and believes it has helped her. She never needs Proventil rescue inhaler and no longer has significant acute respiratory problems beyond an occasional cold. Nasal symptoms are not bothering her. We discussed stopping her allergy vaccine now and see how she does without it.  02/25/12- 81 yo f former smoker followed for asthma/ COPD, allergic rhinitis, GERD FOLLOWS FOR: still on vaccine-OV to continue vaccine and needs refilled; also needs Epipen refilled She wants to continue her allergy vaccine 1:10/ GO after stopping briefly. Has had no acute problems. We discussed continued use of allergy vaccine, goals, risks, realistic expectations. She needs EpiPen refill. Her primary physician gets her chest x-rays.  02/24/13- 81 yo F former smoker followed for asthma/ COPD, allergic rhinitis, GERD FOLLOWS FOR: still on Allergy vaccine 1:10 GO and doing well; Print Rx for Epipen as well. No acute issues. Breathing is comfortable. She states that allergy vaccine still helps and wants to continue. Her primary physician gets chest x-ray when needed.  02/24/14- 81 yo F former smoker followed for asthma/ COPD, allergic rhinitis, GERD FOLLOWS FOR: pt has no respiratory complaints at this time.  pt tolerating allergy injections well.  Allergy Vaccine 1:10 GO. She says she has tried without allergy shots and "they work better".  02/25/2015-81 year old female former smoker followed for asthma/COPD, allergic rhinitis, GERD Allergy Vaccine 1:10 GO FOLLOWS FOR: Pt reports that she is tolerating her vaccines well. No complaints.  She does not want to stop allergy vaccine. We discussed ongoing treatment, option to stop at any time, risk of reaction.  ROS-see HPI Constitutional:   No-   weight loss, night sweats, fevers, chills,  fatigue, lassitude. HEENT:   No-  headaches, difficulty swallowing, tooth/dental problems, sore throat,       No-  sneezing, itching, ear ache, nasal congestion, post nasal drip,  CV:  No-   chest pain, orthopnea, PND, swelling in lower extremities, anasarca, dizziness, palpitations Resp: No-   shortness of breath with exertion or at rest.              No-   productive cough,  No non-productive cough,  No- coughing up of blood.              No-   change in color of mucus.  No- wheezing.   Skin: No-   rash or lesions. GI:  No-   heartburn, indigestion, abdominal pain, nausea, vomiting,  GU: . MS:  No-   joint pain or swelling.   Neuro-     nothing unusual Psych:  No- change in mood or affect. No depression or anxiety.  No memory loss.  OBJ General- Alert, Oriented, Affect-appropriate, Distress- none acute Skin- rash-none, lesions- none, excoriation- none Lymphadenopathy- none Head- atraumatic            Eyes- Gross vision intact, PERRLA, conjunctivae clear secretions            Ears- Hearing, canals-normal            Nose- Clear, no-Septal dev, mucus, polyps, erosion, perforation             Throat- Mallampati II , mucosa clear , drainage- none, tonsils- atrophic Neck- flexible , trachea midline, no stridor , thyroid nl, carotid no bruit Chest - symmetrical excursion , unlabored  Heart/CV- RRR , no murmur , no gallop  , no rub, nl s1 s2                           - JVD- none , edema- none, stasis changes- none, varices- none           Lung- clear to P&A, wheeze- none, cough- none , dullness-none, rub- none           Chest wall-  Abd-  Br/ Gen/ Rectal- Not done, not indicated Extrem- cyanosis- none, clubbing, none, atrophy- none, strength- nl Neuro- grossly intact to observation

## 2015-02-25 NOTE — Assessment & Plan Note (Signed)
We discussed and she wants to continue allergy vaccine. I have emphasized option to stop. Plan-continue 1 more year, then we will push the issue.

## 2015-03-01 NOTE — Telephone Encounter (Signed)
Received new copy of insurance card . Benefit coverage sent to [facility] to check insurance.

## 2015-03-07 NOTE — Telephone Encounter (Signed)
Insurance benefit  No deductible  approx cost to pt $90.00 , Co pay $50, With or without office visit $40 co pay for administration , NO PA required. OOP Max $4000 (0 met), Calcium level PCP

## 2015-03-08 ENCOUNTER — Encounter: Payer: Self-pay | Admitting: Gynecology

## 2015-03-09 NOTE — Telephone Encounter (Addendum)
Calcium 01/25/15 9.3 at PCP office. Total cost to pt approx $90.  She will call next week to schedule after talking with son.

## 2015-03-15 ENCOUNTER — Ambulatory Visit (INDEPENDENT_AMBULATORY_CARE_PROVIDER_SITE_OTHER): Payer: Medicare Other | Admitting: Gynecology

## 2015-03-15 DIAGNOSIS — M810 Age-related osteoporosis without current pathological fracture: Secondary | ICD-10-CM

## 2015-03-15 MED ORDER — DENOSUMAB 60 MG/ML ~~LOC~~ SOLN
60.0000 mg | Freq: Once | SUBCUTANEOUS | Status: AC
  Administered 2015-03-15: 60 mg via SUBCUTANEOUS

## 2015-03-16 NOTE — Telephone Encounter (Signed)
prolia injection given 03/15/15.  Next injection due after 09/13/15

## 2015-05-02 ENCOUNTER — Telehealth: Payer: Self-pay | Admitting: Internal Medicine

## 2015-05-02 DIAGNOSIS — J309 Allergic rhinitis, unspecified: Secondary | ICD-10-CM | POA: Diagnosis not present

## 2015-05-02 NOTE — Telephone Encounter (Signed)
Allergy Serum Extract Date Mixed: 05/02/15 Vial: 1 Strength: 1:10 Here/Mail/Pick Up: pick up Mixed By: tbs Last OV: 02/25/15 Pending OV: 02/28/16

## 2015-05-09 ENCOUNTER — Telehealth: Payer: Self-pay | Admitting: Internal Medicine

## 2015-05-11 NOTE — Telephone Encounter (Signed)
Called pt. To let her know her vaccine is ready. She said she would pick it up today or tomorrow. Nothing further needed.

## 2015-08-23 ENCOUNTER — Telehealth: Payer: Self-pay | Admitting: Internal Medicine

## 2015-08-23 ENCOUNTER — Telehealth: Payer: Self-pay | Admitting: Gynecology

## 2015-08-23 DIAGNOSIS — J309 Allergic rhinitis, unspecified: Secondary | ICD-10-CM | POA: Diagnosis not present

## 2015-08-23 NOTE — Telephone Encounter (Signed)
Prolia due after 09/13/15  

## 2015-08-23 NOTE — Telephone Encounter (Signed)
Allergy Serum Extract Date Mixed: 08/23/15 Vial: 1 Strength: 1:10 Here/Mail/Pick Up: mail Mixed By: tbs Last OV: 02/25/16 Pending OV: 02/28/16

## 2015-09-01 ENCOUNTER — Encounter: Payer: Self-pay | Admitting: Gynecology

## 2015-09-01 ENCOUNTER — Ambulatory Visit (INDEPENDENT_AMBULATORY_CARE_PROVIDER_SITE_OTHER): Payer: Medicare Other | Admitting: Gynecology

## 2015-09-01 VITALS — BP 122/80 | Ht 61.0 in | Wt 119.0 lb

## 2015-09-01 DIAGNOSIS — L900 Lichen sclerosus et atrophicus: Secondary | ICD-10-CM

## 2015-09-01 DIAGNOSIS — Z01419 Encounter for gynecological examination (general) (routine) without abnormal findings: Secondary | ICD-10-CM

## 2015-09-01 DIAGNOSIS — M810 Age-related osteoporosis without current pathological fracture: Secondary | ICD-10-CM

## 2015-09-01 DIAGNOSIS — N952 Postmenopausal atrophic vaginitis: Secondary | ICD-10-CM

## 2015-09-01 MED ORDER — CLOBETASOL PROPIONATE 0.05 % EX OINT: TOPICAL_OINTMENT | CUTANEOUS | Status: DC

## 2015-09-01 NOTE — Progress Notes (Signed)
    Lindsey Norris 1926/09/05 [id number]        82 y.o.  G0P0  for breast and pelvic exam.  Past medical history,surgical history, problem list, medications, allergies, family history and social history were all reviewed and documented as reviewed in the EPIC chart.  ROS:  Performed with pertinent positives and negatives included in the history, assessment and plan.   Additional significant findings :  None   Exam: Bari MantisKim Alexis assistant Filed Vitals:   09/01/15 0955  BP: 122/80  Height: 5\' 1"  (1.549 m)  Weight: 119 lb (53.978 kg)   General appearance:  Normal affect, orientation and appearance. Skin: Grossly normal HEENT: Without gross lesions.  No cervical or supraclavicular adenopathy. Thyroid normal.  Lungs:  Clear without wheezing, rales or rhonchi Cardiac: RR, without RMG Abdominal:  Soft, nontender, without masses, guarding, rebound, organomegaly or hernia Breasts:  Examined lying and sitting without masses, retractions, discharge or axillary adenopathy. Pelvic:  Ext/BUS/Vagina with significant atrophic changes. Barely admits 1 finger.  Cervix unable to visualize.   Uterus difficult to visualize. No gross masses or tenderness  Adnexa without gross masses or tenderness    Anus and perineum normal   Rectovaginal normal sphincter tone without palpated masses or tenderness.    Assessment/Plan:  82 y.o. G0P0 female for breast and pelvic exam.   1. Postmenopausal/atrophic genital changes. Without significant hot flushes, night sweats, vaginal dryness or any vaginal bleeding. Continue monitor and report any issues or bleeding. 2. Osteoporosis. DEXA 2015 T score -2.5 stable from prior DEXA. On Prolia now 4 years. Continue with Prolia now. Repeat DEXA end of this year. 3. Mammography 11/2014. Continue with annual mammography when due. SBE monthly reviewed. 4. Colonoscopy 2009. Repeat at their recommended interval. 5. Pap smear 2011. No Pap smear done today. No history of  significant abnormal Pap smears previously. We both agree per current screening guidelines to stop screening based on age. 6. Health maintenance. No routine lab work done as this is done at her primary physician's office. Follow up for Prolia otherwise annual exam 1 year.   Dara LordsFONTAINE,Takako Minckler P MD, 10:34 AM 09/01/2015

## 2015-09-01 NOTE — Telephone Encounter (Signed)
Prolia insurance benefits No deductible, OV $40,  Co insurance $40, No PA needed, PCP  Calcium 9.3 01/25/15.  OOPM  $4000 ($134 met). Appointment for 09/15/15  At 3pm Nurse only for injection.

## 2015-09-01 NOTE — Patient Instructions (Signed)
Follow up for your Prolia shot.  Follow up for your bone density this coming fall.

## 2015-09-02 ENCOUNTER — Encounter: Payer: Self-pay | Admitting: Gynecology

## 2015-09-06 ENCOUNTER — Telehealth: Payer: Self-pay | Admitting: Internal Medicine

## 2015-09-06 NOTE — Telephone Encounter (Signed)
Per Dimas Millinammy Scott, pt's vaccine is ready for pickup.  Pt aware.  Nothing further needed.

## 2015-09-15 ENCOUNTER — Ambulatory Visit (INDEPENDENT_AMBULATORY_CARE_PROVIDER_SITE_OTHER): Payer: Medicare Other | Admitting: *Deleted

## 2015-09-15 DIAGNOSIS — M810 Age-related osteoporosis without current pathological fracture: Secondary | ICD-10-CM

## 2015-09-15 MED ORDER — DENOSUMAB 60 MG/ML ~~LOC~~ SOLN
60.0000 mg | Freq: Once | SUBCUTANEOUS | Status: AC
  Administered 2015-09-15: 60 mg via SUBCUTANEOUS

## 2015-09-15 NOTE — Telephone Encounter (Signed)
Prolia given 09/15/15  Next injection due after 03/18/16

## 2015-11-03 ENCOUNTER — Telehealth: Payer: Self-pay | Admitting: *Deleted

## 2015-11-03 NOTE — Telephone Encounter (Signed)
Pt called c/o new found right breast lump requesting order for [facility] I explained to pt she would have to see Dr. Audie BoxFontaine to order imaging, pt transferred to front desk to schedule.

## 2015-11-07 ENCOUNTER — Encounter: Payer: Self-pay | Admitting: Gynecology

## 2015-11-07 ENCOUNTER — Ambulatory Visit (INDEPENDENT_AMBULATORY_CARE_PROVIDER_SITE_OTHER): Payer: Medicare Other | Admitting: Gynecology

## 2015-11-07 VITALS — BP 140/86

## 2015-11-07 DIAGNOSIS — N6081 Other benign mammary dysplasias of right breast: Secondary | ICD-10-CM | POA: Diagnosis not present

## 2015-11-07 NOTE — Progress Notes (Signed)
    Austin Mileslizabeth A Viera February 07, 1927 [id number]        82 y.o.  G0P0 presents complaining of a right breast mass. Called to schedule her mammogram had mentioned she felt a small nodule and they referred her for physician evaluation first. Noticed over the last several weeks. Not uncomfortable or symptomatic.  Past medical history,surgical history, problem list, medications, allergies, family history and social history were all reviewed and documented in the EPIC chart.  Directed ROS with pertinent positives and negatives documented in the history of present illness/assessment and plan.  Exam: Kennon PortelaKim Gardner assistant Vitals:   11/07/15 1516  BP: 140/86   General appearance:  Normal Both breast examined lying and sitting. Left without masses, retractions, discharge, adenopathy. Right with small granular size classic sebaceous cyst 4:00 periphery of the breast along the sternal border with classic appearance and blackhead. No other masses, retractions, discharge, adenopathy.  Assessment/Plan:  82 y.o. G0P0 with small sebaceous cyst right breast classic in appearance granular size. We'll proceed with routine mammography in bone density as she is due now and she'll arrange this through [facility].    Dara LordsFONTAINE,Lorelie Biermann P MD, 3:29 PM 11/07/2015

## 2015-11-07 NOTE — Patient Instructions (Signed)
[facility] should call you to schedule your routine mammogram and bone density. If you do not hear from them within 1-2 weeks call my office.

## 2015-11-08 ENCOUNTER — Telehealth: Payer: Self-pay | Admitting: *Deleted

## 2015-11-08 NOTE — Telephone Encounter (Signed)
-----   Message from Dara Lordsimothy P Fontaine, MD sent at 11/07/2015  3:30 PM EDT ----- Patient called [facility] to arrange for her mammogram and bone density. She mentioned to them that she felt a small lump. They referred her to see me. She has a granular her size sebaceous cyst periphery of the right breast 4:00 position. Classic in appearance. Recommend a schedule just a routine mammography and DEXA.

## 2015-11-08 NOTE — Telephone Encounter (Signed)
Pt dexa and mammogram scheduled on 12/08/15 @ 12:30pm for dexa and mammogram at 1:15pm pt informed, order faxed.

## 2015-12-08 ENCOUNTER — Encounter: Payer: Self-pay | Admitting: Gynecology

## 2015-12-12 ENCOUNTER — Encounter: Payer: Self-pay | Admitting: Gynecology

## 2015-12-12 ENCOUNTER — Telehealth: Payer: Self-pay | Admitting: Gynecology

## 2015-12-12 NOTE — Telephone Encounter (Signed)
Tell patient her bone density shows improvement at her hips. I would recommend continuing Prolia this year and will stop it next year. We'll repeat her bone density in 2 years

## 2015-12-12 NOTE — Telephone Encounter (Addendum)
Tell patient her bone density shows improvement at her hips. I would recommend continuing Prolia this year and will stop it next year. We'll repeat her bone density in 2 years Note From Dr Audie BoxFontaine. Complete 08/2015

## 2015-12-13 NOTE — Telephone Encounter (Signed)
Pt informed with the below note. 

## 2015-12-21 NOTE — Telephone Encounter (Signed)
Prolia due after 03/17/16. Calcium 9.3  01/25/15 , PCP Tisovec MD

## 2016-01-09 ENCOUNTER — Telehealth: Payer: Self-pay | Admitting: Internal Medicine

## 2016-01-09 DIAGNOSIS — J309 Allergic rhinitis, unspecified: Secondary | ICD-10-CM | POA: Diagnosis not present

## 2016-01-09 NOTE — Telephone Encounter (Addendum)
Allergy Serum Extract Date Mixed: 01/09/16 Vial: 1 Strength: 1:10 Here/Mail/Pick Up: pick up Called pt. Today to let her know her vac. Is ready. She will pick it up by the end of the week. Mixed By: tbs Last OV: 02/25/15 Pending OV: 02/28/16

## 2016-02-15 ENCOUNTER — Telehealth: Payer: Self-pay | Admitting: Internal Medicine

## 2016-02-15 DIAGNOSIS — J309 Allergic rhinitis, unspecified: Secondary | ICD-10-CM

## 2016-02-15 NOTE — Telephone Encounter (Signed)
Katie please advise. Thanks. 

## 2016-02-17 NOTE — Telephone Encounter (Signed)
Lindsey Norris/CY  please advise if the pt should keep this appt or cancel and set up with someone else.  thanks

## 2016-02-21 NOTE — Telephone Encounter (Signed)
Please let patient know that she can keep appt with CY as he sees her for Pulmonary as well. She will not be able to continue to be seen for allergic rhinitis and receive allergy injections after this week. She will need to establish with new allergist in town. Thanks.

## 2016-02-21 NOTE — Telephone Encounter (Signed)
Spoke with pt. She is aware of Katie's response. Pt will keep her appointment on 02/28/16 at 11am for asthma. Referral will be placed for another Allergist. Nothing further was needed.

## 2016-02-28 ENCOUNTER — Encounter: Payer: Self-pay | Admitting: Internal Medicine

## 2016-02-28 ENCOUNTER — Ambulatory Visit (INDEPENDENT_AMBULATORY_CARE_PROVIDER_SITE_OTHER): Payer: Medicare Other | Admitting: Internal Medicine

## 2016-02-28 VITALS — BP 132/88 | HR 99 | Ht 61.0 in | Wt 112.8 lb

## 2016-02-28 DIAGNOSIS — J301 Allergic rhinitis due to pollen: Secondary | ICD-10-CM | POA: Diagnosis not present

## 2016-02-28 DIAGNOSIS — J4520 Mild intermittent asthma, uncomplicated: Secondary | ICD-10-CM

## 2016-02-28 DIAGNOSIS — J302 Other seasonal allergic rhinitis: Secondary | ICD-10-CM | POA: Diagnosis not present

## 2016-02-28 DIAGNOSIS — J3089 Other allergic rhinitis: Secondary | ICD-10-CM | POA: Diagnosis not present

## 2016-02-28 NOTE — Progress Notes (Signed)
HPI female former smoker followed for asthma/COPD, allergic rhinitis, GERD Had given her own allergy vaccine at 1:10 for many years until program at this office stopped 02/26/2016.  -----------------------------------------------------------------  02/25/2015-83 year old female former smoker followed for asthma/COPD, allergic rhinitis, GERD Allergy Vaccine 1:10 GO FOLLOWS FOR: Pt reports that she is tolerating her vaccines well. No complaints.  She does not want to stop allergy vaccine. We discussed ongoing treatment, option to stop at any time, risk of reaction.  02/28/2016-83 year old female former smoker followed for asthma/COPD, allergic rhinitis, GERD FOLLOWS FOR: 1 year ROV-, pt states her asthma is doing well with no issues with it.  Retired Physiological scientistnursing instructor. She sees ending allergy self injection as end of using a professional skilled she was proud of. Remembers symptom flare when she stopped allergy vaccine years ago and is actually resistant to stopping now, against my advice. Denies routine cough or wheeze. Mild rhinorrhea. Not using nasal sprays, antihistamines or bronchodilators. Reports CXR by Dr. Wylene Simmerisovec 2 or 3 years ago- not in our EMR.  ROS-see HPI Constitutional:   No-   weight loss, night sweats, fevers, chills, fatigue, lassitude. HEENT:   No-  headaches, difficulty swallowing, tooth/dental problems, sore throat,       No-  sneezing, itching, ear ache, nasal congestion, post nasal drip,  CV:  No-   chest pain, orthopnea, PND, swelling in lower extremities, anasarca, dizziness, palpitations Resp: No-   shortness of breath with exertion or at rest.              No-   productive cough,  No non-productive cough,  No- coughing up of blood.              No-   change in color of mucus.  No- wheezing.   Skin: No-   rash or lesions. GI:  No-   heartburn, indigestion, abdominal pain, nausea, vomiting,  GU: . MS:  No-   joint pain or swelling.   Neuro-     nothing  unusual Psych:  No- change in mood or affect. No depression or anxiety.  No memory loss.  OBJ General- Alert, Oriented, Affect-appropriate, Distress- none acute, alert elderly female Skin- rash-none, lesions- none, excoriation- none Lymphadenopathy- none Head- atraumatic            Eyes- Gross vision intact, PERRLA, conjunctivae clear secretions            Ears- hard of hearing +            Nose- Clear, no-Septal dev, mucus, polyps, erosion, perforation             Throat- Mallampati II , mucosa clear , drainage- none, tonsils- atrophic Neck- flexible , trachea midline, no stridor , thyroid nl, carotid no bruit Chest - symmetrical excursion , unlabored           Heart/CV- RRR , no murmur , no gallop  , no rub, nl s1 s2                           - JVD- none , edema- none, stasis changes- none, varices- none           Lung- clear to P&A, wheeze- none, cough- none , dullness-none, rub- none           Chest wall-  Abd-  Br/ Gen/ Rectal- Not done, not indicated Extrem- cyanosis- none, clubbing, none, atrophy- none, strength- nl Neuro- grossly intact to observation

## 2016-02-28 NOTE — Patient Instructions (Addendum)
Order-  Referral to [facility] (office of Drs Lucie LeatherKozlow, Beaulah DinningBardelas, et al.  " evaluate rhinitis")  Please call as needed

## 2016-02-28 NOTE — Assessment & Plan Note (Signed)
She doesn't want to stop allergy vaccine although I have explained I don't think she needs it anymore. Almost certainly any rhinitis is atrophic and vasomotor rather than allergic. She asks referral to another allergy provider since our program is closing. Plan-at her request she is being referred for updated allergy evaluation, with my recommendation that allergy vaccine be stopped then observe, rather than immediately retesting in restarting allergy vaccine at her age.

## 2016-02-28 NOTE — Assessment & Plan Note (Signed)
No exacerbation and not needing bronchodilators

## 2016-03-06 NOTE — Telephone Encounter (Addendum)
PC to pt , no insurance change for 2018. Will call after verification from Prolia. COMPLETE exam 08/2015  Calcium

## 2016-03-28 NOTE — Telephone Encounter (Signed)
Prolia due 03/18/16, Insurance  No deductible, co pay $40 OOPM $4000($40 met). No PA< Calcium PCP 01/30/16  9.4   Complete exam TF 09/01/15  Appoint 04/05/16  At 3 pm

## 2016-03-29 ENCOUNTER — Encounter: Payer: Self-pay | Admitting: Gynecology

## 2016-04-05 ENCOUNTER — Ambulatory Visit (INDEPENDENT_AMBULATORY_CARE_PROVIDER_SITE_OTHER): Payer: Medicare Other | Admitting: Gynecology

## 2016-04-05 DIAGNOSIS — M810 Age-related osteoporosis without current pathological fracture: Secondary | ICD-10-CM | POA: Diagnosis not present

## 2016-04-05 MED ORDER — DENOSUMAB 60 MG/ML ~~LOC~~ SOLN
60.0000 mg | Freq: Once | SUBCUTANEOUS | Status: AC
  Administered 2016-04-05: 60 mg via SUBCUTANEOUS

## 2016-04-10 NOTE — Telephone Encounter (Signed)
prolia given next injection 10/04/16

## 2016-05-28 ENCOUNTER — Ambulatory Visit (INDEPENDENT_AMBULATORY_CARE_PROVIDER_SITE_OTHER): Payer: Medicare Other | Admitting: Allergy and Immunology

## 2016-05-28 ENCOUNTER — Encounter: Payer: Self-pay | Admitting: Allergy and Immunology

## 2016-05-28 VITALS — BP 142/82 | HR 95 | Temp 97.7°F | Resp 20 | Ht 58.5 in | Wt 118.0 lb

## 2016-05-28 DIAGNOSIS — J302 Other seasonal allergic rhinitis: Secondary | ICD-10-CM | POA: Diagnosis not present

## 2016-05-28 DIAGNOSIS — J3089 Other allergic rhinitis: Secondary | ICD-10-CM | POA: Diagnosis not present

## 2016-05-28 DIAGNOSIS — J4520 Mild intermittent asthma, uncomplicated: Secondary | ICD-10-CM | POA: Diagnosis not present

## 2016-05-28 MED ORDER — FLUTICASONE PROPIONATE 50 MCG/ACT NA SUSP: 1.0000 | Freq: Two times a day (BID) | NASAL | 5 refills | Status: DC

## 2016-05-28 MED ORDER — ALBUTEROL SULFATE HFA 108 (90 BASE) MCG/ACT IN AERS: 2.0000 | INHALATION_SPRAY | RESPIRATORY_TRACT | 1 refills | Status: DC | PRN

## 2016-05-28 MED ORDER — AZELASTINE HCL 0.1 % NA SOLN: 1.0000 | Freq: Two times a day (BID) | NASAL | 3 refills | Status: DC

## 2016-05-28 NOTE — Assessment & Plan Note (Signed)
Given her concern about increased asthma symptoms while off of immunotherapy, a short acting bronchodilator will be prescribed.  A prescription has been provided for albuterol HFA, 1-2 inhalations every 4-6 hours as needed.  Subjective and objective measures of pulmonary function will be followed and the treatment plan will be adjusted accordingly.

## 2016-05-28 NOTE — Progress Notes (Signed)
New Patient Note  RE: Lindsey Norris MRN: [id number] DOB: August 05, 1926 Date of Office Visit: 05/28/2016  Referring provider: Gaspar Garbe, MD Primary care provider: Gaspar Garbe, MD  Chief Complaint: Allergic Rhinitis  and Immunotherapy   History of present illness: Lindsey Norris is a 83 y.o. female seen today in consultation requested by Guerry Bruin, MD.  She had been followed by Dr. Jetty Duhamel for allergic rhinitis and intermittent asthma and is transferring her care to our office.  She reports that she has been on aeroallergen immunotherapy for a total of approximately 19 years.  She states that she has attempted, off the shots, however when she is not taking the injections she seems to develop asthma symptoms when she has upper respiratory tract infections.  While on immunotherapy, this does not seem to be an issue.  She has been self administering her allergy injections at home.   Assessment and plan: Seasonal and perennial allergic rhinitis I had a lengthy discussion with Lindsey Norris and it has been determined that, given her age and the fact that she will no longer be able to receive her injections at home, immunotherapy will be discontinued at this time.  I suspect that any symptoms she experiences currently are related to nonallergic/vasomotor rhinitis.  A prescription has been provided for azelastine nasal spray, 1 spray per nostril daily as needed. Proper nasal spray technique has been discussed and demonstrated.  I have also recommended nasal saline spray (i.e. Simply Saline) as needed and prior to medicated nasal sprays.  Asthma, mild intermittent, well-controlled Given her concern about increased asthma symptoms while off of immunotherapy, a short acting bronchodilator will be prescribed.  A prescription has been provided for albuterol HFA, 1-2 inhalations every 4-6 hours as needed.  Subjective and objective measures of pulmonary function will  be followed and the treatment plan will be adjusted accordingly.   Meds ordered this encounter  Medications  . albuterol (PROAIR HFA) 108 (90 Base) MCG/ACT inhaler    Sig: Inhale 2 puffs into the lungs every 4 (four) hours as needed for wheezing or shortness of breath.    Dispense:  1 Inhaler    Refill:  1  . DISCONTD: fluticasone (FLONASE) 50 MCG/ACT nasal spray    Sig: Place 1 spray into both nostrils 2 (two) times daily.    Dispense:  16 g    Refill:  5  . azelastine (ASTELIN) 0.1 % nasal spray    Sig: Place 1 spray into both nostrils 2 (two) times daily. Use in each nostril as directed    Dispense:  30 mL    Refill:  3    Diagnostics: Spirometry:  Normal with an FEV1 of 94% predicted.  Please see scanned spirometry results for details.    Physical examination: Blood pressure (!) 142/82, pulse 95, temperature 97.7 F (36.5 C), temperature source Oral, resp. rate 20, height 4' 10.5" (1.486 m), weight 118 lb (53.5 kg), SpO2 95 %.  General: Alert, interactive, in no acute distress. HEENT: TMs pearly gray, turbinates mildly edematous without discharge, post-pharynx unremarkable. Neck: Supple without lymphadenopathy. Lungs: Clear to auscultation without wheezing, rhonchi or rales. CV: Normal S1, S2 without murmurs. Abdomen: Nondistended, nontender. Skin: Warm and dry, without lesions or rashes. Extremities:  No clubbing, cyanosis or edema. Neuro:   Grossly intact.  Review of systems:  Review of systems negative except as noted in HPI / PMHx or noted below: Review of Systems  Constitutional: Negative.  HENT: Negative.   Eyes: Negative.   Respiratory: Negative.   Cardiovascular: Negative.   Gastrointestinal: Negative.   Genitourinary: Negative.   Musculoskeletal: Negative.   Skin: Negative.   Neurological: Negative.   Endo/Heme/Allergies: Negative.   Psychiatric/Behavioral: Negative.     Past medical history:  Past Medical History:  Diagnosis Date  . Arthritis     . Asthma   . Chronic gastritis   . Colon polyp    dimnutive 2mm  . Duodenitis without hemorrhage   . GERD (gastroesophageal reflux disease)   . Hemorrhoids, internal    & external  . Hypercholesteremia   . Hypertension   . Osteoporosis 2013/2015/2017   2013 T score -2.5, 2015 T score - 2.4, 2017 T score -2.3 improved at both hips from prior study  . Sclerosis of the skin    LICHENS SCLEROSIS  . Urticaria     Past surgical history:  Past Surgical History:  Procedure Laterality Date  . APPENDECTOMY    . CATARACT EXTRACTION     bilateral x 2  . COLONOSCOPY    . DILATION AND CURETTAGE OF UTERUS  1969  . ESOPHAGOGASTRODUODENOSCOPY    . NOSE SURGERY     DEVIATED SEPTUM REPAIR  . REFRACTIVE SURGERY     LASER EYE SURGERY X2/ 2010 AND 2011    Family history: Family History  Problem Relation Age of Onset  . Hypertension Mother   . Asthma Mother   . Heart disease Father   . Uterine cancer Paternal Aunt   . Diabetes Maternal Grandfather   . Stomach cancer Paternal Grandfather   . Colon cancer Neg Hx   . Allergic rhinitis Neg Hx   . Angioedema Neg Hx   . Eczema Neg Hx   . Immunodeficiency Neg Hx   . Urticaria Neg Hx     Social history: Social History   Social History  . Marital status: Married    Spouse name: N/A  . Number of children: 0  . Years of education: N/A   Occupational History  . Not on file.   Social History Main Topics  . Smoking status: Former Smoker    Packs/day: 0.10    Years: 4.00    Types: Cigarettes    Quit date: 02/27/1956  . Smokeless tobacco: Never Used  . Alcohol use No  . Drug use: No  . Sexual activity: No     Comment: 1st intercourse 39yo-1 partner   Other Topics Concern  . Not on file   Social History Narrative  . No narrative on file   Environmental History: The patient lives in a 81 year old house with carpeting throughout, gas heat, and central air.  There no pets in the home.  There is no known mold/water damage in the  home.  She has a remote history of cigarette smoking, having smoked 1 cigarette per day for about 5 years during the 1950s.  Allergies as of 05/28/2016      Reactions   Sulfonamide Derivatives    REACTION: vomiting      Medication List       Accurate as of 05/28/16  6:02 PM. Always use your most recent med list.          albuterol 108 (90 Base) MCG/ACT inhaler Commonly known as:  PROAIR HFA Inhale 2 puffs into the lungs every 4 (four) hours as needed for wheezing or shortness of breath.   azelastine 0.1 % nasal spray Commonly known as:  ASTELIN Place 1 spray  into both nostrils 2 (two) times daily. Use in each nostril as directed   celecoxib 200 MG capsule Commonly known as:  CELEBREX Take 200 mg by mouth daily as needed.   clobetasol ointment 0.05 % Commonly known as:  TEMOVATE APPLY TOPICALLY AT BEDTIME AS NEEDED, NO TO USE FOR EXTENDED PERIODS OF TIME   denosumab 60 MG/ML Soln injection Commonly known as:  PROLIA Inject 60 mg into the skin every 6 (six) months. Administer in upper arm, thigh, or abdomen   EPINEPHrine 0.3 mg/0.3 mL Soaj injection Commonly known as:  EPIPEN 2-PAK Inject into thigh if needed for severe allergic reaction   multivitamin capsule Take 1 capsule by mouth daily.   NEXIUM 40 MG capsule Generic drug:  esomeprazole TAKE 1 CAPSULE EVERY DAY 30 MINUTES BEFORE A MEAL   NONFORMULARY OR COMPOUNDED ITEM Allergy Vaccine 1:10 Given at Home   PRESCRIPTION MEDICATION Allergy injection q. week   traMADol 200 MG 24 hr tablet Commonly known as:  ULTRAM-ER as needed.   triamterene-hydrochlorothiazide 37.5-25 MG capsule Commonly known as:  DYAZIDE Take 1 capsule by mouth.       Known medication allergies: Allergies  Allergen Reactions  . Sulfonamide Derivatives     REACTION: vomiting    I appreciate the opportunity to take part in [facility] care. Please do not hesitate to contact me with questions.  Sincerely,   R. Jorene Guest,  MD

## 2016-05-28 NOTE — Patient Instructions (Signed)
Seasonal and perennial allergic rhinitis I had a lengthy discussion with Lindsey Norris and it has been determined that, given her age and the fact that she will no longer be able to receive her injections at home, immunotherapy will be discontinued at this time.  I suspect that any symptoms she experiences currently are related to nonallergic/vasomotor rhinitis.  A prescription has been provided for azelastine nasal spray, 1 spray per nostril daily as needed. Proper nasal spray technique has been discussed and demonstrated.  I have also recommended nasal saline spray (i.e. Simply Saline) as needed and prior to medicated nasal sprays.  Asthma, mild intermittent, well-controlled Given her concern about increased asthma symptoms while off of immunotherapy, a short acting bronchodilator will be prescribed.  A prescription has been provided for albuterol HFA, 1-2 inhalations every 4-6 hours as needed.  Subjective and objective measures of pulmonary function will be followed and the treatment plan will be adjusted accordingly.   Return in about 6 months (around 11/27/2016), or if symptoms worsen or fail to improve.

## 2016-05-28 NOTE — Assessment & Plan Note (Signed)
I had a lengthy discussion with Mrs. Summa and it has been determined that, given her age and the fact that she will no longer be able to receive her injections at home, immunotherapy will be discontinued at this time.  I suspect that any symptoms she experiences currently are related to nonallergic/vasomotor rhinitis.  A prescription has been provided for azelastine nasal spray, 1 spray per nostril daily as needed. Proper nasal spray technique has been discussed and demonstrated.  I have also recommended nasal saline spray (i.e. Simply Saline) as needed and prior to medicated nasal sprays.

## 2016-06-12 ENCOUNTER — Ambulatory Visit: Payer: Medicare Other | Admitting: Allergy and Immunology

## 2016-09-04 ENCOUNTER — Ambulatory Visit (INDEPENDENT_AMBULATORY_CARE_PROVIDER_SITE_OTHER): Payer: Medicare Other | Admitting: Gynecology

## 2016-09-04 ENCOUNTER — Encounter: Payer: Self-pay | Admitting: Gynecology

## 2016-09-04 VITALS — BP 150/94 | Ht 59.0 in | Wt 118.0 lb

## 2016-09-04 DIAGNOSIS — M810 Age-related osteoporosis without current pathological fracture: Secondary | ICD-10-CM | POA: Diagnosis not present

## 2016-09-04 DIAGNOSIS — N952 Postmenopausal atrophic vaginitis: Secondary | ICD-10-CM | POA: Diagnosis not present

## 2016-09-04 DIAGNOSIS — Z01411 Encounter for gynecological examination (general) (routine) with abnormal findings: Secondary | ICD-10-CM

## 2016-09-04 NOTE — Progress Notes (Signed)
    Lindsey Norris 01/11/1927 [id number]        83 y.o.  G0P0 for annual exam.    Past medical history,surgical history, problem list, medications, allergies, family history and social history were all reviewed and documented as reviewed in the EPIC chart.  ROS:  Performed with pertinent positives and negatives included in the history, assessment and plan.   Additional significant findings :  None   Exam: Kennon PortelaKim Gardner assistant Vitals:   09/04/16 1418  BP: (!) 150/94  Weight: 118 lb (53.5 kg)  Height: 4\' 11"  (1.499 m)   Body mass index is 23.83 kg/m.  General appearance:  Normal affect, orientation and appearance. Skin: Grossly normal HEENT: Without gross lesions.  No cervical or supraclavicular adenopathy. Thyroid normal.  Lungs:  Clear without wheezing, rales or rhonchi Cardiac: RR, without RMG Abdominal:  Soft, nontender, without masses, guarding, rebound, organomegaly or hernia Breasts:  Examined lying and sitting without masses, retractions, discharge or axillary adenopathy. Pelvic:  Ext, BUS, Vagina: With atrophic changes  Cervix: Unable to visualize  Uterus: Difficult to palpate but no gross masses or tenderness  Adnexa: Without masses or tenderness    Anus and perineum: Normal   Rectovaginal: Normal sphincter tone without palpated masses or tenderness.    Assessment/Plan:  83 y.o. G0P0 female for annual exam.   1. Postmenopausal/atrophic genital changes. No significant hot flushes, night sweats, vaginal dryness or any vaginal bleeding. Continue to monitor and report any issues or bleeding. 2. Osteoporosis. DEXA 2013 T score of -2.5.  DEXA 2017 T score -2.3. On Prolia now for 5 years. Recommended she stop Prolia now and then plan repeat DEXA next year 11/2017 at 2 year interval. We have been unable to study her spine due to degenerative changes. The issues as to whether to start bisphosphate following Prolia to inhibit accelerated spinal bone loss discussed. She is  not interested in doing so having taken Fosamax in the past and having GI upset. Options for IV also discussed. At this point she is comfortable stopping it altogether and then following up in one year for bone density. 3. Mammography 11/2015. Continue with annual mammography when due. Breast exam normal today. 4. Colonoscopy 2009. Repeat at her primary's recommendation. 5. Pap smear 2011. No Pap smear done today. No history of abnormal Pap smears. We both agree to stop screening per current screening guidelines based on age. 6. Health maintenance. No routine lab work done as patient does this elsewhere. Blood pressure noted at 150/94. Patient notes that it does jump around and that her internist is following her for this and she'll follow up with them in reference to this. Follow up in one year, sooner as needed.   Dara LordsFONTAINE,Chasya Zenz P MD, 2:56 PM 09/04/2016

## 2016-09-04 NOTE — Patient Instructions (Signed)
Dr. Jake SeatsProlia as we discussed.  Follow up in one year for annual exam, sooner as needed.

## 2016-10-09 ENCOUNTER — Telehealth: Payer: Self-pay | Admitting: Gynecology

## 2016-10-16 NOTE — Telephone Encounter (Signed)
Complete exam TF 08/2016  . Insurance benefits checked No PA , No deductible, co pay $40.  Calcium 01/2016 9.4  OOPM $4000 (558 met). Per note from TF complete exam recommend stopping Prolia has been five years and check  bone density in 2019

## 2016-10-17 ENCOUNTER — Encounter: Payer: Self-pay | Admitting: Gynecology

## 2016-12-11 ENCOUNTER — Encounter: Payer: Self-pay | Admitting: Gynecology

## 2017-04-24 ENCOUNTER — Observation Stay (HOSPITAL_COMMUNITY)
Admission: EM | Admit: 2017-04-24 | Discharge: 2017-04-27 | Disposition: A | Discharge status: Home / Self Care | Payer: Medicare Other | Attending: Internal Medicine | Admitting: Internal Medicine

## 2017-04-24 ENCOUNTER — Emergency Department (HOSPITAL_COMMUNITY): Payer: Medicare Other

## 2017-04-24 ENCOUNTER — Encounter (HOSPITAL_COMMUNITY): Payer: Self-pay | Admitting: *Deleted

## 2017-04-24 ENCOUNTER — Other Ambulatory Visit: Payer: Self-pay

## 2017-04-24 DIAGNOSIS — I10 Essential (primary) hypertension: Secondary | ICD-10-CM | POA: Insufficient documentation

## 2017-04-24 DIAGNOSIS — S0181XA Laceration without foreign body of other part of head, initial encounter: Principal | ICD-10-CM

## 2017-04-24 DIAGNOSIS — Y92002 Bathroom of unspecified non-institutional (private) residence single-family (private) house as the place of occurrence of the external cause: Secondary | ICD-10-CM | POA: Insufficient documentation

## 2017-04-24 DIAGNOSIS — K219 Gastro-esophageal reflux disease without esophagitis: Secondary | ICD-10-CM | POA: Diagnosis present

## 2017-04-24 DIAGNOSIS — J449 Chronic obstructive pulmonary disease, unspecified: Secondary | ICD-10-CM | POA: Diagnosis not present

## 2017-04-24 DIAGNOSIS — Z87891 Personal history of nicotine dependence: Secondary | ICD-10-CM | POA: Diagnosis not present

## 2017-04-24 DIAGNOSIS — Y998 Other external cause status: Secondary | ICD-10-CM | POA: Diagnosis not present

## 2017-04-24 DIAGNOSIS — R55 Syncope and collapse: Secondary | ICD-10-CM | POA: Diagnosis not present

## 2017-04-24 DIAGNOSIS — Z79899 Other long term (current) drug therapy: Secondary | ICD-10-CM | POA: Diagnosis not present

## 2017-04-24 DIAGNOSIS — R569 Unspecified convulsions: Secondary | ICD-10-CM | POA: Insufficient documentation

## 2017-04-24 DIAGNOSIS — S01411A Laceration without foreign body of right cheek and temporomandibular area, initial encounter: Secondary | ICD-10-CM | POA: Diagnosis not present

## 2017-04-24 DIAGNOSIS — X58XXXA Exposure to other specified factors, initial encounter: Secondary | ICD-10-CM | POA: Diagnosis not present

## 2017-04-24 DIAGNOSIS — R4182 Altered mental status, unspecified: Secondary | ICD-10-CM

## 2017-04-24 DIAGNOSIS — M1990 Unspecified osteoarthritis, unspecified site: Secondary | ICD-10-CM | POA: Diagnosis present

## 2017-04-24 DIAGNOSIS — E785 Hyperlipidemia, unspecified: Secondary | ICD-10-CM

## 2017-04-24 DIAGNOSIS — Y9389 Activity, other specified: Secondary | ICD-10-CM | POA: Insufficient documentation

## 2017-04-24 DIAGNOSIS — Z23 Encounter for immunization: Secondary | ICD-10-CM | POA: Insufficient documentation

## 2017-04-24 DIAGNOSIS — E876 Hypokalemia: Secondary | ICD-10-CM

## 2017-04-24 DIAGNOSIS — J45909 Unspecified asthma, uncomplicated: Secondary | ICD-10-CM | POA: Insufficient documentation

## 2017-04-24 HISTORY — DX: Chronic obstructive pulmonary disease, unspecified: J44.9

## 2017-04-24 LAB — I-STAT CG4 LACTIC ACID, ED: Lactic Acid, Venous: 3.15 mmol/L (ref 0.5–1.9)

## 2017-04-24 MED ORDER — TETANUS-DIPHTH-ACELL PERTUSSIS 5-2.5-18.5 LF-MCG/0.5 IM SUSP
0.5000 mL | Freq: Once | INTRAMUSCULAR | Status: AC
  Administered 2017-04-24: 0.5 mL via INTRAMUSCULAR
  Filled 2017-04-24: qty 0.5

## 2017-04-24 NOTE — ED Notes (Signed)
Dermabond at bedside.  

## 2017-04-24 NOTE — ED Triage Notes (Signed)
Pt lives with her husband. Pt's husband heard the patient yell, then heard a thud. Husband found pt lying on the ground with seizure-like activity. When EMS arrived, pt had drool from mouth, appeared postictal, and unable to answer questions. Lac noted to R eye and center of head with bleeding controlled on arrival. Pt is hard of hearing. No hx of seizures. C-collar in place

## 2017-04-24 NOTE — ED Provider Notes (Signed)
[facility] EMERGENCY DEPARTMENT Provider Note   CSN: [id number] Arrival date & time: 04/24/17  2259     History   Chief Complaint Chief Complaint  Patient presents with  . Loss of Consciousness    HPI Lindsey Norris is a 84 y.o. female.  The history is provided by the patient and the EMS personnel.  She has history of hypertension, hyperlipidemia, COPD, asthma, osteoarthritis, osteoporosis and comes in by ambulance following a syncopal episode at home.  Apparently, her husband heard her scream on the bathroom, and her husband went and found her on the floor unresponsive.  She did suffer lacerations to her forehead and face.  He does not know when her last tetanus immunization was.  Has been called for EMS and they reported that she appeared to be postictal.  There was no incontinence and no bit lip or tongue.  There is no history of seizures.  Patient does not remember the incident.  She is not complaining of pain anywhere.  Past Medical History:  Diagnosis Date  . Arthritis   . Asthma   . Chronic gastritis   . Colon polyp    dimnutive 2mm  . Duodenitis without hemorrhage   . GERD (gastroesophageal reflux disease)   . Hemorrhoids, internal    & external  . Hypercholesteremia   . Hypertension   . Osteoporosis 2013/2015/2017   2013 T score -2.5, 2015 T score - 2.4, 2017 T score -2.3 improved at both hips from prior study  . Sclerosis of the skin    LICHENS SCLEROSIS  . Urticaria     Patient Active Problem List   Diagnosis Date Noted  . Arthritis   . Seasonal and perennial allergic rhinitis 05/09/2010  . DYSLIPIDEMIA 06/11/2007  . HYPERTENSION 06/11/2007  . HEMORRHOIDS 06/11/2007  . COPD, MILD 06/11/2007  . OSTEOARTHRITIS 06/11/2007  . Personal history of colonic polyps 01/15/2007  . Asthma, mild intermittent, well-controlled 01/15/2007  . GERD 01/15/2007  . GASTRITIS, CHRONIC 04/22/2001    Past Surgical History:  Procedure Laterality Date    . APPENDECTOMY    . CATARACT EXTRACTION     bilateral x 2  . COLONOSCOPY    . DILATION AND CURETTAGE OF UTERUS  1969  . ESOPHAGOGASTRODUODENOSCOPY    . NOSE SURGERY     DEVIATED SEPTUM REPAIR  . REFRACTIVE SURGERY     LASER EYE SURGERY X2/ 2010 AND 2011    OB History    Gravida Para Term Preterm AB Living   0             SAB TAB Ectopic Multiple Live Births                   Home Medications    Prior to Admission medications   Medication Sig Start Date End Date Taking? Authorizing Provider  albuterol (PROAIR HFA) 108 (90 Base) MCG/ACT inhaler Inhale 2 puffs into the lungs every 4 (four) hours as needed for wheezing or shortness of breath. 05/28/16   Bobbitt, Heywood Iles, MD  azelastine (ASTELIN) 0.1 % nasal spray Place 1 spray into both nostrils 2 (two) times daily. Use in each nostril as directed 05/28/16   Bobbitt, Heywood Iles, MD  celecoxib (CELEBREX) 200 MG capsule Take 200 mg by mouth daily as needed. 07/19/15   [provider]  clobetasol ointment (TEMOVATE) 0.05 % APPLY TOPICALLY AT BEDTIME AS NEEDED, NO TO USE FOR EXTENDED PERIODS OF TIME Patient not taking: Reported on 05/28/2016  09/01/15   Fontaine, Nadyne Coombes, MD  denosumab (PROLIA) 60 MG/ML SOLN injection Inject 60 mg into the skin every 6 (six) months. Administer in upper arm, thigh, or abdomen    [provider]  EPINEPHrine (EPIPEN 2-PAK) 0.3 mg/0.3 mL IJ SOAJ injection Inject into thigh if needed for severe allergic reaction Patient not taking: Reported on 09/04/2016 02/24/14   Waymon Budge, MD  Multiple Vitamin (MULTIVITAMIN) capsule Take 1 capsule by mouth daily.      [provider]  NEXIUM 40 MG capsule TAKE 1 CAPSULE EVERY DAY 30 MINUTES BEFORE A MEAL 01/22/12   Iva Boop, MD  traMADol (ULTRAM-ER) 200 MG 24 hr tablet as needed. 08/29/15   [provider]  triamterene-hydrochlorothiazide (DYAZIDE) 37.5-25 MG per capsule Take 1 capsule by mouth.      [provider]     Family History Family History  Problem Relation Age of Onset  . Hypertension Mother   . Asthma Mother   . Heart disease Father   . Uterine cancer Paternal Aunt   . Diabetes Maternal Grandfather   . Stomach cancer Paternal Grandfather   . Colon cancer Neg Hx   . Allergic rhinitis Neg Hx   . Angioedema Neg Hx   . Eczema Neg Hx   . Immunodeficiency Neg Hx   . Urticaria Neg Hx     Social History Social History   Tobacco Use  . Smoking status: Former Smoker    Packs/day: 0.10    Years: 4.00    Pack years: 0.40    Types: Cigarettes    Last attempt to quit: 02/27/1956    Years since quitting: 61.1  . Smokeless tobacco: Never Used  Substance Use Topics  . Alcohol use: No    Alcohol/week: 0.0 oz  . Drug use: No     Allergies   Sulfonamide derivatives   Review of Systems Review of Systems  All other systems reviewed and are negative.    Physical Exam Updated Vital Signs BP (!) 160/87   Pulse (!) 109   Temp 98.4 F (36.9 C)   Resp 18   SpO2 98%   Physical Exam  Nursing note and vitals reviewed.  84 year old female, resting comfortably and in no acute distress. Vital signs are significant for elevated heart rate and elevated systolic blood pressure. Oxygen saturation is 98%, which is normal. Head is normocephalic.  Lacerations are present in the midline of the forehead, and lateral to the right eye.  Also superficial laceration across the bridge of the nose which does not require any closure.  PERRLA, EOMI. Oropharynx is clear. Neck is immobilized in a stiff cervical collar and his nontender without adenopathy or JVD. Back is nontender and there is no CVA tenderness. Lungs are clear without rales, wheezes, or rhonchi. Chest is nontender. Heart has regular rate and rhythm without murmur. Abdomen is soft, flat, nontender without masses or hepatosplenomegaly and peristalsis is normoactive. Extremities have no cyanosis or edema, full range of motion is  present. Skin is warm and dry without rash. Neurologic: She is awake and alert and oriented to person and place but not time, cranial nerves are intact, there are no motor or sensory deficits.  ED Treatments / Results  Labs (all labs ordered are listed, but only abnormal results are displayed) Labs Reviewed  COMPREHENSIVE METABOLIC PANEL - Abnormal; Notable for the following components:      Result Value   Potassium 2.9 (*)  Chloride 100 (*)    Glucose, Bld 175 (*)    BUN 24 (*)    Creatinine, Ser 1.04 (*)    GFR calc non Af Amer 47 (*)    GFR calc Af Amer 54 (*)    All other components within normal limits  I-STAT CG4 LACTIC ACID, ED - Abnormal; Notable for the following components:   Lactic Acid, Venous 3.15 (*)    All other components within normal limits  CBC WITH DIFFERENTIAL/PLATELET  I-STAT CG4 LACTIC ACID, ED    EKG  EKG Interpretation  Date/Time:  Wednesday April 24 2017 23:07:18 EST Ventricular Rate:  98 PR Interval:    QRS Duration: 87 QT Interval:  359 QTC Calculation: 459 R Axis:   39 Text Interpretation:  Sinus tachycardia Atrial premature complexes Otherwise within normal limits When compared with ECG of 09/14/1998, Premature atrial complexes are now present Confirmed by Dione BoozeGlick, Juwann Sherk ([id number]) on 04/24/2017 11:21:20 PM       Radiology No results found.  Procedures .[name].Laceration Repair Date/Time: 04/25/2017 12:47 AM Performed by: Dione BoozeGlick, Theresea Trautmann, MD Authorized by: Dione BoozeGlick, Velvet Moomaw, MD   Consent:    Consent obtained:  Verbal   Consent given by:  Patient   Risks discussed:  Infection and poor cosmetic result   Alternatives discussed:  No treatment Anesthesia (see MAR for exact dosages):    Anesthesia method:  None Laceration details:    Location:  Face   Face location:  Forehead   Length (cm):  2   Depth (mm):  2 Repair type:    Repair type:  Simple Pre-procedure details:    Preparation:  Patient was prepped and draped in usual sterile fashion and  imaging obtained to evaluate for foreign bodies Exploration:    Hemostasis achieved with:  Direct pressure   Wound exploration: entire depth of wound probed and visualized     Wound extent: no foreign bodies/material noted     Contaminated: no   Treatment:    Area cleansed with:  Saline   Amount of cleaning:  Standard Skin repair:    Repair method:  Tissue adhesive Approximation:    Approximation:  Close Post-procedure details:    Dressing:  Open (no dressing)   Patient tolerance of procedure:  Tolerated well, no immediate complications .[name].Laceration Repair Date/Time: 04/25/2017 12:48 AM Performed by: Dione BoozeGlick, Yee Joss, MD Authorized by: Dione BoozeGlick, Ferron Ishmael, MD   Consent:    Consent obtained:  Verbal   Consent given by:  Patient   Risks discussed:  Infection and poor cosmetic result   Alternatives discussed:  No treatment Anesthesia (see MAR for exact dosages):    Anesthesia method:  None Laceration details:    Location:  Face   Face location:  R cheek   Length (cm):  1.5   Depth (mm):  2 Repair type:    Repair type:  Simple Pre-procedure details:    Preparation:  Patient was prepped and draped in usual sterile fashion and imaging obtained to evaluate for foreign bodies Exploration:    Hemostasis achieved with:  Direct pressure   Wound exploration: entire depth of wound probed and visualized     Wound extent: no foreign bodies/material noted     Contaminated: no   Treatment:    Area cleansed with:  Saline   Amount of cleaning:  Standard Skin repair:    Repair method:  Tissue adhesive Approximation:    Approximation:  Close Post-procedure details:    Dressing:  Open (no dressing)   Patient  tolerance of procedure:  Tolerated well, no immediate complications   Medications Ordered in ED Medications  levETIRAcetam (KEPPRA) 1,000 mg in sodium chloride 0.9 % 100 mL IVPB (not administered)  potassium chloride SA (K-DUR,KLOR-CON) CR tablet 40 mEq (not administered)  potassium  chloride 10 mEq in 100 mL IVPB (not administered)  Tdap (BOOSTRIX) injection 0.5 mL (0.5 mLs Intramuscular Given 04/24/17 2349)     Initial Impression / Assessment and Plan / ED Course  I have reviewed the triage vital signs and the nursing notes.  Pertinent labs & imaging results that were available during my care of the patient were reviewed by me and considered in my medical decision making (see chart for details).  Syncopal episode with facial lacerations.  Possible seizure.  Old records are reviewed, and she has no history of seizures and no relevant past visits.  She is not on any medications which would lower the seizure threshold.  Will check CT of head and cervical spine and screening labs. Tdap booster is given.  Lacerations are closed with tissue adhesive.  CT shows no acute injury.  Lactic acid is elevated which is consistent with recent seizure, and she is given a loading dose of levetiracetam.  Potassium has come back low.  While not a likely cause of syncope, it is low enough that it needs immediate correction and she is given oral and intravenous potassium.  Attempt was made to ambulate the patient, but she was noted to be unsteady.  She will be held in the ED for case management to make sure that she has appropriate home health needs including walker.  She will be discharged with prescription for levetiracetam and referred to neurology for follow-up.  Final Clinical Impressions(s) / ED Diagnoses   Final diagnoses:  Syncope, unspecified syncope type  Laceration of face, initial encounter  Hypokalemia    ED Discharge Orders        Ordered    levETIRAcetam (KEPPRA) 500 MG tablet  2 times daily     04/25/17 0713    Ambulatory referral to Neurology    Comments:  An appointment is requested in approximately: 1 week   04/25/17 0714    potassium chloride SA (K-DUR,KLOR-CON) 20 MEQ tablet  2 times daily     04/25/17 0714       Dione Booze, MD 04/25/17 [phone]

## 2017-04-25 ENCOUNTER — Observation Stay (HOSPITAL_COMMUNITY): Payer: Medicare Other

## 2017-04-25 ENCOUNTER — Encounter (HOSPITAL_COMMUNITY): Payer: Self-pay | Admitting: Emergency Medicine

## 2017-04-25 DIAGNOSIS — I10 Essential (primary) hypertension: Secondary | ICD-10-CM | POA: Diagnosis not present

## 2017-04-25 DIAGNOSIS — R55 Syncope and collapse: Secondary | ICD-10-CM

## 2017-04-25 DIAGNOSIS — J45909 Unspecified asthma, uncomplicated: Secondary | ICD-10-CM | POA: Diagnosis not present

## 2017-04-25 DIAGNOSIS — R569 Unspecified convulsions: Secondary | ICD-10-CM

## 2017-04-25 DIAGNOSIS — S0181XA Laceration without foreign body of other part of head, initial encounter: Secondary | ICD-10-CM | POA: Diagnosis not present

## 2017-04-25 DIAGNOSIS — J449 Chronic obstructive pulmonary disease, unspecified: Secondary | ICD-10-CM | POA: Diagnosis not present

## 2017-04-25 DIAGNOSIS — R4182 Altered mental status, unspecified: Secondary | ICD-10-CM

## 2017-04-25 LAB — BASIC METABOLIC PANEL
ANION GAP: 11 (ref 5–15)
BUN: 12 mg/dL (ref 6–20)
CALCIUM: 9.3 mg/dL (ref 8.9–10.3)
CO2: 24 mmol/L (ref 22–32)
Chloride: 104 mmol/L (ref 101–111)
Creatinine, Ser: 0.76 mg/dL (ref 0.44–1.00)
Glucose, Bld: 104 mg/dL — ABNORMAL HIGH (ref 65–99)
Potassium: 4 mmol/L (ref 3.5–5.1)
SODIUM: 139 mmol/L (ref 135–145)

## 2017-04-25 LAB — COMPREHENSIVE METABOLIC PANEL
ALBUMIN: 3.7 g/dL (ref 3.5–5.0)
ALK PHOS: 93 U/L (ref 38–126)
ALT: 14 U/L (ref 14–54)
AST: 33 U/L (ref 15–41)
Anion gap: 12 (ref 5–15)
BILIRUBIN TOTAL: 0.6 mg/dL (ref 0.3–1.2)
BUN: 24 mg/dL — AB (ref 6–20)
CALCIUM: 9.2 mg/dL (ref 8.9–10.3)
CO2: 26 mmol/L (ref 22–32)
CREATININE: 1.04 mg/dL — AB (ref 0.44–1.00)
Chloride: 100 mmol/L — ABNORMAL LOW (ref 101–111)
GFR calc Af Amer: 54 mL/min — ABNORMAL LOW (ref >60)
GFR, EST NON AFRICAN AMERICAN: 47 mL/min — AB (ref >60)
GLUCOSE: 175 mg/dL — AB (ref 65–99)
Potassium: 2.9 mmol/L — ABNORMAL LOW (ref 3.5–5.1)
Sodium: 138 mmol/L (ref 135–145)
TOTAL PROTEIN: 6.6 g/dL (ref 6.5–8.1)

## 2017-04-25 LAB — RAPID URINE DRUG SCREEN, HOSP PERFORMED
Amphetamines: NOT DETECTED
BARBITURATES: NOT DETECTED
Benzodiazepines: NOT DETECTED
Cocaine: NOT DETECTED
Opiates: NOT DETECTED
Tetrahydrocannabinol: NOT DETECTED

## 2017-04-25 LAB — CBC WITH DIFFERENTIAL/PLATELET
BASOS ABS: 0 10*3/uL (ref 0.0–0.1)
BASOS PCT: 0 %
EOS ABS: 0.1 10*3/uL (ref 0.0–0.7)
Eosinophils Relative: 1 %
HCT: 38.3 % (ref 36.0–46.0)
Hemoglobin: 12.2 g/dL (ref 12.0–15.0)
Lymphocytes Relative: 17 %
Lymphs Abs: 1.6 10*3/uL (ref 0.7–4.0)
MCH: 29.2 pg (ref 26.0–34.0)
MCHC: 31.9 g/dL (ref 30.0–36.0)
MCV: 91.6 fL (ref 78.0–100.0)
MONO ABS: 0.6 10*3/uL (ref 0.1–1.0)
Monocytes Relative: 6 %
Neutro Abs: 7.2 10*3/uL (ref 1.7–7.7)
Neutrophils Relative %: 76 %
Platelets: 257 10*3/uL (ref 150–400)
RBC: 4.18 MIL/uL (ref 3.87–5.11)
RDW: 15.1 % (ref 11.5–15.5)
WBC: 9.5 10*3/uL (ref 4.0–10.5)

## 2017-04-25 LAB — TROPONIN I

## 2017-04-25 LAB — URINALYSIS, ROUTINE W REFLEX MICROSCOPIC
Bilirubin Urine: NEGATIVE
Glucose, UA: NEGATIVE mg/dL
HGB URINE DIPSTICK: NEGATIVE
KETONES UR: NEGATIVE mg/dL
Leukocytes, UA: NEGATIVE
NITRITE: NEGATIVE
PROTEIN: NEGATIVE mg/dL
SPECIFIC GRAVITY, URINE: 1.006 (ref 1.005–1.030)
pH: 8 (ref 5.0–8.0)

## 2017-04-25 LAB — I-STAT CG4 LACTIC ACID, ED: LACTIC ACID, VENOUS: 1.1 mmol/L (ref 0.5–1.9)

## 2017-04-25 LAB — MAGNESIUM: MAGNESIUM: 1.9 mg/dL (ref 1.7–2.4)

## 2017-04-25 MED ORDER — POTASSIUM CHLORIDE CRYS ER 20 MEQ PO TBCR
40.0000 meq | EXTENDED_RELEASE_TABLET | Freq: Once | ORAL | Status: AC
  Administered 2017-04-25: 40 meq via ORAL
  Filled 2017-04-25: qty 2

## 2017-04-25 MED ORDER — BISACODYL 10 MG RE SUPP: 10.0000 mg | Freq: Every day | RECTAL | Status: DC | PRN

## 2017-04-25 MED ORDER — ONDANSETRON HCL 4 MG PO TABS: 4.0000 mg | ORAL_TABLET | Freq: Four times a day (QID) | ORAL | Status: DC | PRN

## 2017-04-25 MED ORDER — PANTOPRAZOLE SODIUM 40 MG IV SOLR
40.0000 mg | INTRAVENOUS | Status: DC
  Administered 2017-04-25 – 2017-04-26 (×2): 40 mg via INTRAVENOUS
  Filled 2017-04-25 (×2): qty 40

## 2017-04-25 MED ORDER — ACETAMINOPHEN 650 MG RE SUPP: 650.0000 mg | Freq: Four times a day (QID) | RECTAL | Status: DC | PRN

## 2017-04-25 MED ORDER — ACETAMINOPHEN 325 MG PO TABS
650.0000 mg | ORAL_TABLET | Freq: Four times a day (QID) | ORAL | Status: DC | PRN
  Administered 2017-04-26: 650 mg via ORAL
  Filled 2017-04-25: qty 2

## 2017-04-25 MED ORDER — AZELASTINE HCL 0.1 % NA SOLN
1.0000 | Freq: Two times a day (BID) | NASAL | Status: DC
  Administered 2017-04-25 – 2017-04-27 (×4): 1 via NASAL
  Filled 2017-04-25: qty 30

## 2017-04-25 MED ORDER — SENNOSIDES-DOCUSATE SODIUM 8.6-50 MG PO TABS: 1.0000 | ORAL_TABLET | Freq: Every evening | ORAL | Status: DC | PRN

## 2017-04-25 MED ORDER — ONDANSETRON HCL 4 MG/2ML IJ SOLN: 4.0000 mg | Freq: Four times a day (QID) | INTRAMUSCULAR | Status: DC | PRN

## 2017-04-25 MED ORDER — SODIUM CHLORIDE 0.9 % IV SOLN
1000.0000 mg | Freq: Once | INTRAVENOUS | Status: AC
  Administered 2017-04-25: 1000 mg via INTRAVENOUS
  Filled 2017-04-25: qty 10

## 2017-04-25 MED ORDER — ALBUTEROL SULFATE (2.5 MG/3ML) 0.083% IN NEBU: 2.5000 mg | INHALATION_SOLUTION | RESPIRATORY_TRACT | Status: DC | PRN

## 2017-04-25 MED ORDER — POTASSIUM CHLORIDE 10 MEQ/100ML IV SOLN
10.0000 meq | Freq: Once | INTRAVENOUS | Status: AC
  Administered 2017-04-25: 10 meq via INTRAVENOUS
  Filled 2017-04-25: qty 100

## 2017-04-25 MED ORDER — SODIUM CHLORIDE 0.9 % IV SOLN
INTRAVENOUS | Status: DC
  Administered 2017-04-25 – 2017-04-27 (×3): via INTRAVENOUS

## 2017-04-25 MED ORDER — ENOXAPARIN SODIUM 30 MG/0.3ML ~~LOC~~ SOLN
30.0000 mg | SUBCUTANEOUS | Status: DC
  Administered 2017-04-25 – 2017-04-26 (×2): 30 mg via SUBCUTANEOUS
  Filled 2017-04-25 (×2): qty 0.3

## 2017-04-25 MED ORDER — POTASSIUM CHLORIDE CRYS ER 20 MEQ PO TBCR: 20.0000 meq | EXTENDED_RELEASE_TABLET | Freq: Two times a day (BID) | ORAL | 0 refills | Status: DC

## 2017-04-25 MED ORDER — LORAZEPAM 2 MG/ML IJ SOLN: 1.0000 mg | INTRAMUSCULAR | Status: DC | PRN

## 2017-04-25 MED ORDER — LEVETIRACETAM 500 MG PO TABS
500.0000 mg | ORAL_TABLET | Freq: Once | ORAL | Status: AC
  Administered 2017-04-25: 500 mg via ORAL
  Filled 2017-04-25: qty 1

## 2017-04-25 MED ORDER — LEVETIRACETAM 500 MG PO TABS: 500.0000 mg | ORAL_TABLET | Freq: Two times a day (BID) | ORAL | 0 refills | Status: DC

## 2017-04-25 NOTE — ED Notes (Signed)
Pt c/o having to urinate frequently-- placed on bedpan, urinated small amount -- dr notified-- pt called out to be placed on pan again.

## 2017-04-25 NOTE — ED Provider Notes (Signed)
Patient is a 84 year old female who was seen by prior provider.  Initial plan was to reevaluate, walk patient, have her be seen by case management.  Patient is still mildly confused.  Patient's husband is now at bedside and feels like she is been altered for the last 2-3 weeks.  This all happened when she started to increase her tramadol from every 6 hours to every 4 hours.  We tried to ambulate patient and she required 2 person assist.  Patient will need to be admitted for new onset seizures, need for MRI, and further workup for altered mental status.     Abelino DerrickMackuen, [place] Brogden Lyn, MD 04/25/17 1144

## 2017-04-25 NOTE — Discharge Planning (Signed)
Elsey Holts J. Hephzibah Strehle, RN, BSN, NCM [phone] Spoke with pt at bedside regarding discharge planning for Home Health Services. Offered pt list of home health agencies to choose from.  Pt chose [facility] to render services. Drew Wilkie of [facility] notified. Patient made aware that [facility] will be in contact in 24-48 hours.  No DME needs identified at this time.  

## 2017-04-25 NOTE — ED Notes (Signed)
Incontinent care completed

## 2017-04-25 NOTE — H&P (Signed)
History and Physical    Lindsey Norris:[id number] DOB: 1927/01/19 DOA: 04/24/2017   PCP: Gaspar Garbe, MD   Patient coming from:  Home    Chief Complaint: Syncope, Seizures, AMS   HPI: Lindsey Norris is a 84 y.o. female with medical history significant for HTN, HLD, COPD-asthma, GERD, osteoarthritis, osteoporosis, brought to the emergency department after being found on the floor unresponsive by her husband.  Syncopal episode was suspected.  The patient did suffer lacerations on her forehead and face.  EMS reported that she appeared to be postictal, having had urine incontinence.  No apparent bowel incontinence.  In addition, a small bit of her tongue was seen.  History is obtained by chart, as the patient is not accompanied by any family member, and she is still lethargic.  Per chart, she has no history of seizures, appears to remember the incident.  Of note, chart states that the patient may have been altered over the last 2-3 weeks, after increasing her tramadol from every 6 hours to every 4 hours as needed for pain.  She is level 5 caveat at this time, due to confusion.  No apparent prior cardiac history.  No other changes in medications.  No other areas of trauma.   ED Course:  BP 122/90   Pulse 74   Temp 98.4 F (36.9 C)   Resp (!) 25   SpO2 97%   Potassium 2.9, was replenished with KCL Chloride 100 Glucose 175 BUN 24, creatinine 1.04 Lactic acid 3.15 EKG sinus tachycardia, some atrial premature complexes Urinalysis negative.  Urine culture was sent by the ER, results pending Received Keppra 1000 mg IV, and another 500 oral  Review of Systems: Unable to obtain, as the patient is level 5 caveat due to confusion  Past Medical History:  Diagnosis Date  . Arthritis   . Asthma   . Chronic gastritis   . Colon polyp    dimnutive 2mm  . COPD (chronic obstructive pulmonary disease) (HCC)   . Duodenitis without hemorrhage   . GERD (gastroesophageal reflux disease)    . Hemorrhoids, internal    & external  . Hypercholesteremia   . Hypertension   . Osteoporosis 2013/2015/2017   2013 T score -2.5, 2015 T score - 2.4, 2017 T score -2.3 improved at both hips from prior study  . Sclerosis of the skin    LICHENS SCLEROSIS  . Urticaria     Past Surgical History:  Procedure Laterality Date  . APPENDECTOMY    . CATARACT EXTRACTION     bilateral x 2  . COLONOSCOPY    . DILATION AND CURETTAGE OF UTERUS  1969  . ESOPHAGOGASTRODUODENOSCOPY    . NOSE SURGERY     DEVIATED SEPTUM REPAIR  . REFRACTIVE SURGERY     LASER EYE SURGERY X2/ 2010 AND 2011    Social History Social History   Socioeconomic History  . Marital status: Married    Spouse name: Not on file  . Number of children: 0  . Years of education: Not on file  . Highest education level: Not on file  Social Needs  . Financial resource strain: Not on file  . Food insecurity - worry: Not on file  . Food insecurity - inability: Not on file  . Transportation needs - medical: Not on file  . Transportation needs - non-medical: Not on file  Occupational History  . Not on file  Tobacco Use  . Smoking status: Former Smoker  Packs/day: 0.10    Years: 4.00    Pack years: 0.40    Types: Cigarettes    Last attempt to quit: 02/27/1956    Years since quitting: 61.2  . Smokeless tobacco: Never Used  Substance and Sexual Activity  . Alcohol use: No    Alcohol/week: 0.0 oz  . Drug use: No  . Sexual activity: No    Birth control/protection: Post-menopausal    Comment: 1st intercourse 39yo-1 partner  Other Topics Concern  . Not on file  Social History Narrative  . Not on file     Allergies  Allergen Reactions  . Sulfonamide Derivatives     REACTION: vomiting    Family History  Problem Relation Age of Onset  . Hypertension Mother   . Asthma Mother   . Heart disease Father   . Uterine cancer Paternal Aunt   . Diabetes Maternal Grandfather   . Stomach cancer Paternal Grandfather     . Colon cancer Neg Hx   . Allergic rhinitis Neg Hx   . Angioedema Neg Hx   . Eczema Neg Hx   . Immunodeficiency Neg Hx   . Urticaria Neg Hx       Prior to Admission medications   Medication Sig Start Date End Date Taking? Authorizing Provider  albuterol (PROAIR HFA) 108 (90 Base) MCG/ACT inhaler Inhale 2 puffs into the lungs every 4 (four) hours as needed for wheezing or shortness of breath. 05/28/16   Bobbitt, Heywood Iles, MD  azelastine (ASTELIN) 0.1 % nasal spray Place 1 spray into both nostrils 2 (two) times daily. Use in each nostril as directed 05/28/16   Bobbitt, Heywood Iles, MD  celecoxib (CELEBREX) 200 MG capsule Take 200 mg by mouth daily as needed. 07/19/15   [provider]  clobetasol ointment (TEMOVATE) 0.05 % APPLY TOPICALLY AT BEDTIME AS NEEDED, NO TO USE FOR EXTENDED PERIODS OF TIME Patient not taking: Reported on 05/28/2016 09/01/15   Fontaine, Nadyne Coombes, MD  denosumab (PROLIA) 60 MG/ML SOLN injection Inject 60 mg into the skin every 6 (six) months. Administer in upper arm, thigh, or abdomen    [provider]  EPINEPHrine (EPIPEN 2-PAK) 0.3 mg/0.3 mL IJ SOAJ injection Inject into thigh if needed for severe allergic reaction Patient not taking: Reported on 09/04/2016 02/24/14   Waymon Budge, MD  levETIRAcetam (KEPPRA) 500 MG tablet Take 1 tablet (500 mg total) by mouth 2 (two) times daily. 04/25/17   Dione Booze, MD  Multiple Vitamin (MULTIVITAMIN) capsule Take 1 capsule by mouth daily.      [provider]  NEXIUM 40 MG capsule TAKE 1 CAPSULE EVERY DAY 30 MINUTES BEFORE A MEAL 01/22/12   Iva Boop, MD  potassium chloride SA (K-DUR,KLOR-CON) 20 MEQ tablet Take 1 tablet (20 mEq total) by mouth 2 (two) times daily. 04/25/17   Dione Booze, MD  traMADol (ULTRAM-ER) 200 MG 24 hr tablet as needed. 08/29/15   [provider]  triamterene-hydrochlorothiazide (DYAZIDE) 37.5-25 MG per capsule Take 1 capsule by mouth.      [provider]     Physical Exam:  Vitals:   04/25/17 0813 04/25/17 0900 04/25/17 1030 04/25/17 1201  BP: 138/61 132/79 122/90   Pulse: 85 94 87 74  Resp: 12 (!) 28 15 (!) 25  Temp:      SpO2: 100% 96% 98% 97%   Constitutional: NAD, very frail, lethargic.  She does respond to voice, if she attempts to engage in conversation, and answer  questions.   Eyes: PERRL, lids and conjunctivae normal has a laceration in her right lateral lead. ENMT: Mucous membranes are moist, without exudate or lesions.  patient is edentulous. Neck: normal, supple, no masses, no thyromegaly Respiratory: clear to auscultation bilaterally, no wheezing, no crackles. Normal respiratory effort  Cardiovascular: Regular rate and rhythm with occasional premature beat, 6  harsh murmur, rubs or gallops. No extremity edema. 2+ pedal pulses. No carotid bruits.  Abdomen: Soft, non tender, No hepatosplenomegaly. Bowel sounds positive.  Musculoskeletal: no clubbing / cyanosis. Moves all extremities Skin: no jaundice, No lesions.  Lacerations in the foot, nose, and right eye area are noted. Neurologic: Sensation intact  Strength unable to be tested, as the patient is unable to interact at this time.     Labs on Admission: I have personally reviewed following labs and imaging studies  CBC: Recent Labs  Lab 04/24/17 2316  WBC 9.5  NEUTROABS 7.2  HGB 12.2  HCT 38.3  MCV 91.6  PLT 257    Basic Metabolic Panel: Recent Labs  Lab 04/24/17 2316  NA 138  K 2.9*  CL 100*  CO2 26  GLUCOSE 175*  BUN 24*  CREATININE 1.04*  CALCIUM 9.2    GFR: CrCl cannot be calculated (Unknown ideal weight.).  Liver Function Tests: Recent Labs  Lab 04/24/17 2316  AST 33  ALT 14  ALKPHOS 93  BILITOT 0.6  PROT 6.6  ALBUMIN 3.7   No results for input(s): LIPASE, AMYLASE in the last 168 hours. No results for input(s): AMMONIA in the last 168 hours.  Coagulation Profile: No results for input(s): INR, PROTIME in the last 168  hours.  Cardiac Enzymes: No results for input(s): CKTOTAL, CKMB, CKMBINDEX, TROPONINI in the last 168 hours.  BNP (last 3 results) No results for input(s): PROBNP in the last 8760 hours.  HbA1C: No results for input(s): HGBA1C in the last 72 hours.  CBG: No results for input(s): GLUCAP in the last 168 hours.  Lipid Profile: No results for input(s): CHOL, HDL, LDLCALC, TRIG, CHOLHDL, LDLDIRECT in the last 72 hours.  Thyroid Function Tests: No results for input(s): TSH, T4TOTAL, FREET4, T3FREE, THYROIDAB in the last 72 hours.  Anemia Panel: No results for input(s): VITAMINB12, FOLATE, FERRITIN, TIBC, IRON, RETICCTPCT in the last 72 hours.  Urine analysis:    Component Value Date/Time   COLORURINE STRAW (A) 04/25/2017 0852   APPEARANCEUR CLEAR 04/25/2017 0852   LABSPEC 1.006 04/25/2017 0852   PHURINE 8.0 04/25/2017 0852   GLUCOSEU NEGATIVE 04/25/2017 0852   HGBUR NEGATIVE 04/25/2017 0852   BILIRUBINUR NEGATIVE 04/25/2017 0852   KETONESUR NEGATIVE 04/25/2017 0852   PROTEINUR NEGATIVE 04/25/2017 0852   UROBILINOGEN 0.2 08/12/2012 1116   NITRITE NEGATIVE 04/25/2017 0852   LEUKOCYTESUR NEGATIVE 04/25/2017 0852    Sepsis Labs: @LABRCNTIP (procalcitonin:4,lacticidven:4) )No results found for this or any previous visit (from the past 240 hour(s)).   Radiological Exams on Admission: Ct Head Wo Contrast  Result Date: 04/24/2017 CLINICAL DATA:  84 year old female with head trauma. EXAM: CT HEAD WITHOUT CONTRAST CT CERVICAL SPINE WITHOUT CONTRAST TECHNIQUE: Multidetector CT imaging of the head and cervical spine was performed following the standard protocol without intravenous contrast. Multiplanar CT image reconstructions of the cervical spine were also generated. COMPARISON:  None. FINDINGS: CT HEAD FINDINGS Brain: There is mild age-related atrophy and chronic microvascular ischemic changes. There is no acute intracranial hemorrhage. No mass effect or midline shift noted. No  extra-axial fluid collection. Vascular: No hyperdense vessel or unexpected  calcification. Skull: Normal. Negative for fracture or focal lesion. Sinuses/Orbits: The visualized paranasal sinuses and mastoid air cells are clear. Other: Skin laceration over the forehead. CT CERVICAL SPINE FINDINGS Alignment: No acute subluxation. Grade 1 C5-C6 and C7-T1 anterolisthesis. Skull base and vertebrae: No acute fracture.  Osteopenia. Soft tissues and spinal canal: No prevertebral fluid or swelling. No visible canal hematoma. Disc levels: Multilevel degenerative changes most prominent at C6-C7 where there is disc space loss and endplate irregularity. Multilevel facet hypertrophy. Upper chest: Negative. Other: Bilateral carotid bulb calcified plaques. IMPRESSION: 1. No acute intracranial hemorrhage. 2. Mild age-related atrophy and chronic microvascular ischemic changes. 3. No acute/traumatic cervical spine pathology. Multilevel degenerative changes. Electronically Signed   By: Elgie CollardArash  Radparvar M.D.   On: 04/24/2017 23:53   Ct Cervical Spine Wo Contrast  Result Date: 04/24/2017 CLINICAL DATA:  84 year old female with head trauma. EXAM: CT HEAD WITHOUT CONTRAST CT CERVICAL SPINE WITHOUT CONTRAST TECHNIQUE: Multidetector CT imaging of the head and cervical spine was performed following the standard protocol without intravenous contrast. Multiplanar CT image reconstructions of the cervical spine were also generated. COMPARISON:  None. FINDINGS: CT HEAD FINDINGS Brain: There is mild age-related atrophy and chronic microvascular ischemic changes. There is no acute intracranial hemorrhage. No mass effect or midline shift noted. No extra-axial fluid collection. Vascular: No hyperdense vessel or unexpected calcification. Skull: Normal. Negative for fracture or focal lesion. Sinuses/Orbits: The visualized paranasal sinuses and mastoid air cells are clear. Other: Skin laceration over the forehead. CT CERVICAL SPINE FINDINGS Alignment:  No acute subluxation. Grade 1 C5-C6 and C7-T1 anterolisthesis. Skull base and vertebrae: No acute fracture.  Osteopenia. Soft tissues and spinal canal: No prevertebral fluid or swelling. No visible canal hematoma. Disc levels: Multilevel degenerative changes most prominent at C6-C7 where there is disc space loss and endplate irregularity. Multilevel facet hypertrophy. Upper chest: Negative. Other: Bilateral carotid bulb calcified plaques. IMPRESSION: 1. No acute intracranial hemorrhage. 2. Mild age-related atrophy and chronic microvascular ischemic changes. 3. No acute/traumatic cervical spine pathology. Multilevel degenerative changes. Electronically Signed   By: Elgie CollardArash  Radparvar M.D.   On: 04/24/2017 23:53    EKG: Independently reviewed.  Assessment/Plan Principal Problem:   Seizures (HCC) Active Problems:   Syncope   Hyperlipemia   Hypertension   GERD   Arthritis   Altered mental status   Syncopal episode with facial lacerations, likely due to seizures of unknown etiology at this time, rule out med induced.  No prior history of syncope or seizures.  CT of the head negative, CT of the cervical spine no injury noted, lactic acid elevated consistent with recent seizure.  Received 1 dose of Keppra.  MRI of the head is pending.  UDS  No evidence of infectious or other metabolic process causing seizures. UA negative, urine culture sent by EDP. No headache or other complaints to suggest intracranial process, white count is normal.  Patient is afebrile. Tele obs  Seizure order set    Magnesium Neurochecks every 4 hours  Social work consult  f/u  Urine culture   EEG     IVF   Pending and the results of the EEG, or if further seizures are noted, will obtain neurology consultation.  Abnormal EKG.  The patient was noted to be tachycardic, with some atrial premature complexes, which appeared to be new from prior in 2018.  No apparent chest pain.  No troponin for evaluation. Check a troponin Repeat  EKG. If values of troponin not elevated, will EKG continues to  change, will obtain cardiology evaluation.     Elevated lactate in the setting of seizure   Lactate  on presentation to ED was 3. Afebrile  Continue IV fluids repeat lactic acid   Hypokalemia, may be due to diuretics. EKG S tachycardia, with some atrial premature complexes.. K was 2.9 receiving replenishment, received 80 mEq of the ED. Repeat labs later this afternoon and will provide further replenishment if needed. Hold diuretics from today Check Mg Repeat BMET in am Repeat EKG in a.m.   Hypertension BP 122/90   Pulse 74   Patient on Dyazide, currently on hold due to hypokalemia Can resume Dyazide in a.m.   COPD/ Asthma  without exacerbation Osats normal WBC . No wheezing on exam   Nebs with albuterol q 4 prn  and O2     GERD, no acute symptoms Continue PPI  Osteoarthritis with chronic pain.   Continue Celebrex.   Will hold tramadol, narcotics in view of recent seizure and syncopal episode.  Unclear if this agent has been responsible for her illness, but would be prudent to resume here in the hospital. Tylenol as needed   DVT prophylaxis: Lovenox Code Status:    Full Family Communication:  Discussed with patient Disposition Plan: Expect patient to be discharged to home after condition improves Consults called:    None Admission status: Telemetry observation   Marlowe Kays, PA-C [facility]   Amion text  [phone]   04/25/2017, 12:48 PM

## 2017-04-25 NOTE — Progress Notes (Signed)
EEG completed, results pending. 

## 2017-04-25 NOTE — ED Notes (Signed)
Pt assisted by 2 staff to stand at bedside , walkked a few steps with walker -- pt is confused as to who the visitor is at bedside-- husband at bedside -- states that pt has been getting more confused in past few days.

## 2017-04-25 NOTE — Progress Notes (Signed)
New Admission Note:  Arrival Method: on stretcher from ER Mental Orientation: alert to self Telemetry:3w14 Assessment: Completed Skin:Laceration details:    Location:  Face   Face location:  Forehead   Length (cm):  2   Depth (mm):  2 Location:  Face   Face location:  R cheek   Length (cm):  1.5   Depth (mm):  2 IV: L arm saline  Pain:0/10 Safety Measures: Safety Fall Prevention Plan was given, discussed. [id number]: Patient has been orientated to the room, unit and the staff. Family: updated  Orders have been reviewed and implemented. Will continue to monitor the patient. Call light has been placed within reach and bed alarm has been activated.   Lawernce IonYari Amanee Iacovelli ,RN

## 2017-04-25 NOTE — Procedures (Signed)
EEG Report  Clinical History:  Found down on the ground,confused, and with tongue bite.  CT negative for acute hemorrhage.  Technical Summary:  A 19 channel digital EEG recording was performed using the 10-20 international system of electrode placement.  Bipolar and Referential montages were used.  The total recording time was approx 20 minutes.  Findings:  There is a posterior dominant rhythm of 8 Hz reactive to eye opening and closure.  No focal slowing is present.  Photic stimulation and hyperventilation are not performed.  Sleep is not recorded. There are no epileptiform discharges or electrographic seizures present.    Impression:  This is a normal EEG.  However, this does not rule out seizures.  If clinical suspicion remains high then a sleep deprived EEG and/or prolonged ambulatory EEG may be of additional value.    Lindsey SettleShervin Lawrance Wiedemann, MS, MD

## 2017-04-25 NOTE — Discharge Planning (Signed)
Lindsey Norris J. Lucretia RoersWood, RN, BSN, [place] [phone]  [facility] @12 :00.  Spoke with pt husband at bedside and advised of appointment.  Pt husband verbalizes understanding of keeping appointment.

## 2017-04-25 NOTE — ED Notes (Signed)
Pt in EEG. 

## 2017-04-25 NOTE — ED Notes (Signed)
Pt asleep.

## 2017-04-25 NOTE — ED Notes (Signed)
Pt sleeping soundly-- had to be shaken to awaken--

## 2017-04-25 NOTE — ED Notes (Signed)
Pt stood up with my assistance at stretcher. Pt denies dizziness. Pt balancing with my hands and is very unsteady when taking 1 step. Pt states she walks at home prior to fall without assistance.

## 2017-04-26 ENCOUNTER — Observation Stay (HOSPITAL_COMMUNITY): Payer: Medicare Other

## 2017-04-26 ENCOUNTER — Observation Stay (HOSPITAL_BASED_OUTPATIENT_CLINIC_OR_DEPARTMENT_OTHER): Payer: Medicare Other

## 2017-04-26 DIAGNOSIS — M1990 Unspecified osteoarthritis, unspecified site: Secondary | ICD-10-CM

## 2017-04-26 DIAGNOSIS — R55 Syncope and collapse: Secondary | ICD-10-CM | POA: Diagnosis not present

## 2017-04-26 DIAGNOSIS — S0181XA Laceration without foreign body of other part of head, initial encounter: Secondary | ICD-10-CM

## 2017-04-26 DIAGNOSIS — J449 Chronic obstructive pulmonary disease, unspecified: Secondary | ICD-10-CM | POA: Diagnosis not present

## 2017-04-26 DIAGNOSIS — E876 Hypokalemia: Secondary | ICD-10-CM

## 2017-04-26 DIAGNOSIS — J45909 Unspecified asthma, uncomplicated: Secondary | ICD-10-CM | POA: Diagnosis not present

## 2017-04-26 DIAGNOSIS — I340 Nonrheumatic mitral (valve) insufficiency: Secondary | ICD-10-CM | POA: Diagnosis not present

## 2017-04-26 DIAGNOSIS — I10 Essential (primary) hypertension: Secondary | ICD-10-CM

## 2017-04-26 DIAGNOSIS — R569 Unspecified convulsions: Secondary | ICD-10-CM | POA: Diagnosis not present

## 2017-04-26 DIAGNOSIS — R4182 Altered mental status, unspecified: Secondary | ICD-10-CM | POA: Diagnosis not present

## 2017-04-26 LAB — CBC
HEMATOCRIT: 38.5 % (ref 36.0–46.0)
HEMOGLOBIN: 12.4 g/dL (ref 12.0–15.0)
MCH: 29.5 pg (ref 26.0–34.0)
MCHC: 32.2 g/dL (ref 30.0–36.0)
MCV: 91.4 fL (ref 78.0–100.0)
Platelets: 252 10*3/uL (ref 150–400)
RBC: 4.21 MIL/uL (ref 3.87–5.11)
RDW: 15 % (ref 11.5–15.5)
WBC: 9.8 10*3/uL (ref 4.0–10.5)

## 2017-04-26 LAB — ECHOCARDIOGRAM COMPLETE: Weight: 1679.02 oz

## 2017-04-26 LAB — URINE CULTURE: Culture: <10000 — AB

## 2017-04-26 LAB — BASIC METABOLIC PANEL
ANION GAP: 12 (ref 5–15)
BUN: 14 mg/dL (ref 6–20)
CO2: 21 mmol/L — AB (ref 22–32)
Calcium: 9.2 mg/dL (ref 8.9–10.3)
Chloride: 107 mmol/L (ref 101–111)
Creatinine, Ser: 0.75 mg/dL (ref 0.44–1.00)
GFR calc Af Amer: >60 mL/min (ref >60)
GFR calc non Af Amer: >60 mL/min (ref >60)
GLUCOSE: 96 mg/dL (ref 65–99)
POTASSIUM: 3.4 mmol/L — AB (ref 3.5–5.1)
Sodium: 140 mmol/L (ref 135–145)

## 2017-04-26 LAB — TROPONIN I: Troponin I: <0.03 ng/mL (ref <0.03)

## 2017-04-26 MED ORDER — POTASSIUM CHLORIDE CRYS ER 20 MEQ PO TBCR
40.0000 meq | EXTENDED_RELEASE_TABLET | Freq: Once | ORAL | Status: AC
  Administered 2017-04-26: 40 meq via ORAL
  Filled 2017-04-26: qty 2

## 2017-04-26 NOTE — Progress Notes (Signed)
Occupational Therapy Evaluation Patient Details Name: Lindsey Norris MRN: [id number] DOB: [birth year] Today's Date: 04/26/2017    History of Present Illness 84 year old female with history of hypertension, hyperlipidemia, COPD, GERD, osteoarthritis, osteoporosis presented after being found on the floor unresponsive by her husband.  Syncopal episode was suspected.  CT of the head/cervical spine was negative for acute abnormality    Clinical Impression   Pt from home with husband, but unsure of PLOF. Per RN staff who talked with husband, he assisted with ADL at baseline. Nsg states pt's husband arrived in a w/c himsefl and that he is the pt's caregiver.  Currentty requires mod A with ADL and mobility. Apparently confused. Will continue to follow acutely.     Follow Up Recommendations  SNF;Supervision/Assistance - 24 hour    Equipment Recommendations  3 in 1 bedside commode    Recommendations for Other Services       Precautions / Restrictions Precautions Precautions: Fall      Mobility Bed Mobility Overal bed mobility: Needs Assistance Bed Mobility: Supine to Sit;Sit to Supine     Supine to sit: Min assist Sit to supine: Mod assist   General bed mobility comments: Increased assistance for sit - supine - unable to get pt to lie down using gestures  Transfers Overall transfer level: Needs assistance Equipment used: 1 person hand held assist Transfers: Sit to/from Stand;Stand Pivot Transfers Sit to Stand: Mod assist Stand pivot transfers: Mod assist            Balance Overall balance assessment: History of Falls                                         ADL either performed or assessed with clinical judgement   ADL Overall ADL's : Needs assistance/impaired     Grooming: Minimal assistance;Sitting   Upper Body Bathing: Minimal assistance;Sitting   Lower Body Bathing: Moderate assistance;Sit to/from stand   Upper Body Dressing : Moderate  assistance;Sitting   Lower Body Dressing: Moderate assistance;Sit to/from stand   Toilet Transfer: Moderate assistance;BSC;Stand-pivot   Toileting- Clothing Manipulation and Hygiene: Minimal assistance;Sit to/from stand       Functional mobility during ADLs: Moderate assistance General ADL Comments: Appears easily fatigued after toilet transfer; attmepted to talk with pt further regarding awareness of situation; pt was tangential and talking about different topics  - unsure if this was confusion or partially due to being so [facility]     Vision         Perception     Praxis      Pertinent Vitals/Pain Pain Assessment: Faces Faces Pain Scale: Hurts little more Pain Location: R knee with ambulation Pain Descriptors / Indicators: Sore Pain Intervention(s): Monitored during session;Limited activity within patient's tolerance;Repositioned     Hand Dominance Right   Extremity/Trunk Assessment Upper Extremity Assessment Upper Extremity Assessment: Defer to OT evaluation   Lower Extremity Assessment Lower Extremity Assessment: RLE deficits/detail;LLE deficits/detail;Generalized weakness RLE Deficits / Details: tender with swollen knee, AROM grossly WFL, general weakness   Cervical / Trunk Assessment Cervical / Trunk Assessment: Kyphotic   Communication Communication Communication: HOH   Cognition Arousal/Alertness: Awake/alert Behavior During Therapy: WFL for tasks assessed/performed Overall Cognitive Status: No family/caregiver present to determine baseline cognitive functioning  General Comments: Significant hearing deficit made it difficult to determine if pt was abl eto follo commands   General Comments       Exercises     Shoulder Instructions      Home Living Family/patient expects to be discharged to:: Skilled nursing facility                                        Prior Functioning/Environment  Level of Independence: Needs assistance  Gait / Transfers Assistance Needed: unsure of baseline, pt poor historian and extremely HOH  ADL's / Homemaking Assistance Needed: reports spouse assisted her to bathe   Comments: from home with husband; unsure of PLOF; attmepted to contact husband - no answer; per nsg, pt's husband has been caring for her; nsg states pt's husband had dishelved appearance        OT Problem List: Decreased strength;Decreased range of motion;Decreased activity tolerance;Impaired balance (sitting and/or standing);Decreased cognition;Decreased safety awareness;Decreased knowledge of use of DME or AE;Pain      OT Treatment/Interventions: Self-care/ADL training;Therapeutic exercise;Energy conservation;DME and/or AE instruction;Therapeutic activities;Modalities;Cognitive remediation/compensation;Patient/family education;Balance training    OT Goals(Current goals can be found in the care plan section) Acute Rehab OT Goals Patient Stated Goal: none stated OT Goal Formulation: Patient unable to participate in goal setting Time For Goal Achievement: 05/10/17 Potential to Achieve Goals: Fair  OT Frequency: Min 2X/week   Barriers to D/C:            Co-evaluation              AM-PAC PT "6 Clicks" Daily Activity     Outcome Measure Help from another person eating meals?: A Little Help from another person taking care of personal grooming?: A Little Help from another person toileting, which includes using toliet, bedpan, or urinal?: A Lot Help from another person bathing (including washing, rinsing, drying)?: A Lot Help from another person to put on and taking off regular upper body clothing?: A Little Help from another person to put on and taking off regular lower body clothing?: A Little 6 Click Score: 16   End of Session Equipment Utilized During Treatment: Gait belt Nurse Communication: Mobility status  Activity Tolerance: Patient tolerated treatment  well Patient left: in bed;with call bell/phone within reach;with bed alarm set  OT Visit Diagnosis: Unsteadiness on feet (R26.81);Pain;Muscle weakness (generalized) (M62.81);Other symptoms and signs involving cognitive function Pain - part of body: (general discomfort)                Time: [id number] OT Time Calculation (min): 18 min Charges:  OT General Charges $OT Visit: 1 Visit OT Evaluation $OT Eval Moderate Complexity: 1 Mod G-Codes:     Carvel Huskins, OT/L  [phone] 04/26/2017  Lindsey Norris,Lindsey Norris 04/26/2017, 3:46 PM

## 2017-04-26 NOTE — Progress Notes (Signed)
Bilateral carotid duplex completed. 1% to 39% ICA stenosis. Vertebral artey flow is antegrade. Gray[place] ElectricVirginia Kolin Norris, RVS  04/26/2017 9:04 AM

## 2017-04-26 NOTE — Evaluation (Signed)
Physical Therapy Evaluation Patient Details Name: Lindsey Norris MRN: [id number] DOB: 14-Jun-1926 Today's Date: 04/26/2017   History of Present Illness  84 year old female with history of hypertension, hyperlipidemia, COPD, GERD, osteoarthritis, osteoporosis presented after being found on the floor unresponsive by her husband.  Syncopal episode was suspected.  CT of the head/cervical spine was negative for acute abnormality   Clinical Impression  Patient presents with decreased balance, weakness, pain R knee and poor activity tolerance with HR max 120 with ambulation in room all limiting independence and safety with mobility.  She will benefit from skilled PT in the acute setting to allow return home following SNF level rehab stay.     Follow Up Recommendations SNF;Supervision/Assistance - 24 hour    Equipment Recommendations  None recommended by PT    Recommendations for Other Services       Precautions / Restrictions Precautions Precautions: Fall      Mobility  Bed Mobility Overal bed mobility: Needs Assistance Bed Mobility: Supine to Sit;Sit to Supine     Supine to sit: Min assist;HOB elevated Sit to supine: Mod assist   General bed mobility comments: assist for legs into bed to supine  Transfers Overall transfer level: Needs assistance Equipment used: Rolling walker (2 wheeled)(youth) Transfers: Sit to/from Stand Sit to Stand: Mod assist Stand pivot transfers: Mod assist       General transfer comment: lifting help from EOB  Ambulation/Gait Ambulation/Gait assistance: Min assist Ambulation Distance (Feet): 30 Feet Assistive device: Rolling walker (2 wheeled) Gait Pattern/deviations: Step-to pattern;Decreased step length - left;Narrow base of support;Trunk flexed;Decreased stride length     General Gait Details: kyphotic and slow pace, limited distance wtih HR max 120  Stairs            Wheelchair Mobility    Modified Rankin (Stroke Patients  Only)       Balance Overall balance assessment: History of Falls;Needs assistance   Sitting balance-Leahy Scale: Fair       Standing balance-Leahy Scale: Poor                               Pertinent Vitals/Pain Pain Assessment: Faces Faces Pain Scale: Hurts little more Pain Location: R knee with ambulation Pain Descriptors / Indicators: Sore Pain Intervention(s): Monitored during session;Limited activity within patient's tolerance;Repositioned    Home Living Family/patient expects to be discharged to:: Skilled nursing facility                      Prior Function Level of Independence: Needs assistance   Gait / Transfers Assistance Needed: unsure of baseline, pt poor historian and extremely HOH   ADL's / Homemaking Assistance Needed: reports spouse assisted her to bathe  Comments: from home with husband; unsure of PLOF; attmepted to contact husband - no answer; per nsg, pt's husband has been caring for her; nsg states pt's husband had dishelved appearance     Hand Dominance   Dominant Hand: Right    Extremity/Trunk Assessment   Upper Extremity Assessment Upper Extremity Assessment: Defer to OT evaluation    Lower Extremity Assessment Lower Extremity Assessment: RLE deficits/detail;LLE deficits/detail;Generalized weakness RLE Deficits / Details: tender with swollen knee, AROM grossly WFL, general weakness LLE Deficits / Details: AROM WFL, general weakness    Cervical / Trunk Assessment Cervical / Trunk Assessment: Kyphotic  Communication   Communication: HOH  Cognition Arousal/Alertness: Awake/alert Behavior During Therapy: WFL for tasks assessed/performed  Overall Cognitive Status: No family/caregiver present to determine baseline cognitive functioning                                 General Comments: Significant hearing deficit made it difficult to determine if pt was abl eto follo commands      General Comments General  comments (skin integrity, edema, etc.): laceration on L forehead and R eye    Exercises     Assessment/Plan    PT Assessment Patient needs continued PT services  PT Problem List Decreased strength;Decreased mobility;Decreased safety awareness;Decreased balance;Decreased knowledge of use of DME;Decreased activity tolerance;Cardiopulmonary status limiting activity       PT Treatment Interventions DME instruction;Functional mobility training;Balance training;Patient/family education;Gait training;Therapeutic activities;Stair training;Therapeutic exercise    PT Goals (Current goals can be found in the Care Plan section)  Acute Rehab PT Goals Patient Stated Goal: none stated PT Goal Formulation: Patient unable to participate in goal setting Time For Goal Achievement: 05/10/17 Potential to Achieve Goals: Fair    Frequency Min 3X/week   Barriers to discharge        Co-evaluation               AM-PAC PT "6 Clicks" Daily Activity  Outcome Measure Difficulty turning over in bed (including adjusting bedclothes, sheets and blankets)?: A Little Difficulty moving from lying on back to sitting on the side of the bed? : A Lot Difficulty sitting down on and standing up from a chair with arms (e.g., wheelchair, bedside commode, etc,.)?: Unable Help needed moving to and from a bed to chair (including a wheelchair)?: A Little Help needed walking in hospital room?: A Little Help needed climbing 3-5 steps with a railing? : A Lot 6 Click Score: 14    End of Session Equipment Utilized During Treatment: Gait belt Activity Tolerance: Patient limited by fatigue;Treatment limited secondary to medical complications (Comment)(tachy) Patient left: in bed;with call bell/phone within reach;with bed alarm set   PT Visit Diagnosis: Other abnormalities of gait and mobility (R26.89);Unsteadiness on feet (R26.81);Muscle weakness (generalized) (M62.81);History of falling (Z91.81)    Time: [id number] PT  Time Calculation (min) (ACUTE ONLY): 18 min   Charges:   PT Evaluation $PT Eval Moderate Complexity: 1 Mod     PT G CodesSheran Lawless:        Cyndi Anessa Charley, [place] [phone] 04/26/2017   Elray Mcgregorynthia Lionell Matuszak 04/26/2017, 3:59 PM

## 2017-04-26 NOTE — Clinical Social Work Note (Signed)
Clinical Social Work Assessment  Patient Details  Name: Lindsey Norris MRN: [id number] Date of Birth: 09/30/1926  Date of referral:  04/26/17               Reason for consult:  Facility Placement                Permission sought to share information with:  Facility Medical sales representativeContact Representative, Family Supports Permission granted to share information::  Yes, Verbal Permission Granted  Name::     Lindsey Norris  Agency::  SNF  Relationship::  Spouse  Contact Information:     Housing/Transportation Living arrangements for the past 2 months:  Single Family Home Source of Information:  Spouse Patient Interpreter Needed:  None Criminal Activity/Legal Involvement Pertinent to Current Situation/Hospitalization:  No - Comment as needed Significant Relationships:  Spouse Lives with:  Self, Spouse Do you feel safe going back to the place where you live?  Yes Need for family participation in patient care:  (patient not oriented)  Care giving concerns:  Patient lives at home with spouse but will benefit from SNF at discharge prior to returning home with spouse support.   Social Worker assessment / plan:  CSW attempted to meet with patient's spouse at bedside to discuss rehab. CSW attempted to redirect patient's spouse as he was asking questions and demanding to speak to the nurse. CSW located RN who said that she had been in the patient's room and answered his questions twice already. CSW returned to the patient's room with RN to discuss discharge planning with the patient's husband. CSW explained that the spouse was reporting that he could not take care of her at home right now as he is wheelchair bound and that she would need rehab at discharge. CSW explained to the patient's husband the SNF referral process and expectations for SNF placement. CSW obtained permission from the spouse to complete referral and look for SNF options. CSW also attempted to locate any additional family to talk to, as the patient's  husband was agitated and didn't appear to fully understand the conversation, but there are no other family members listed in the patient's contacts. CSW to fax out referral and will follow up.  Employment status:  Retired Database administratornsurance information:  Managed Medicare PT Recommendations:  Not assessed at this time Information / Referral to community resources:  Skilled Nursing Facility  Patient/Family's Response to care:  Patient's response unable to be assessed due to not being oriented. Patient's spouse is agreeable to SNF.  Patient/Family's Understanding of and Emotional Response to Diagnosis, Current Treatment, and Prognosis:  Patient was lying in bed and smiling at CSW during discussion; unable to assess emotional response or understanding. Patient's husband was agitated during discussion, and initially demanded to speak to the nurse and know what was going on with the patient's brain scan. Per nursing report, the patient's husband had been refusing everyone that had come in to the room to see the patient while he was there. Patient's husband demanded to speak to the nurse before he started yelling. Patient's husband was redirected after the nurse was in the room and was reluctantly agreeable to discuss SNF with CSW. Patient's husband said he didn't know what was involved with SNF placement but that he knew the patient needed some therapy.   Emotional Assessment Appearance:  Appears stated age Attitude/Demeanor/Rapport:  Unable to Assess Affect (typically observed):  Unable to Assess Orientation:  Oriented to Self Alcohol / Substance use:  Not Applicable Psych involvement (Current  and /or in the community):  No (Comment)  Discharge Needs  Concerns to be addressed:  Care Coordination Readmission within the last 30 days:  No Current discharge risk:  Dependent with Mobility Barriers to Discharge:  Continued Medical Work up, Insurance Authorization   CYRENE GHARIBIAN, [place] 04/26/2017, 8:25  PM

## 2017-04-26 NOTE — Progress Notes (Addendum)
Patient ID: Lindsey Norris, female   DOB: 1926-11-05, 84 y.o.   MRN: [id number]  PROGRESS NOTE    Alaija Ruble Ewald  WUJ:[id number] DOB: May 01, 1926 DOA: 04/24/2017 PCP: Gaspar Garbe, MD   Brief Narrative: 84 year old female with history of hypertension, hyperlipidemia, COPD, GERD, osteoarthritis, osteoporosis presented after being found on the floor unresponsive by her husband.  Syncopal episode was suspected.  Patient has been more confused over the last 2-3 weeks after her tramadol was increased from every 6 hours to every 4 hours as needed for pain.  There is no witnessed episode of seizure.  Patient was empirically started on Keppra.  CT of the head and cervical spine was negative for acute abnormality.  Assessment & Plan:   Principal Problem:   Seizures (HCC) Active Problems:   Hyperlipemia   Hypertension   GERD   Arthritis   Syncope   Altered mental status  Syncope resulting in facial lacerations -Questionable cause.  CT of the head/cervical spine was negative for acute abnormality EEG is negative for seizures.  It is unclear if the patient had a seizure episode.  We will get MRI of the brain.  Empirically was given a dose of Keppra in the ED.  We will hold off on continuing Keppra for now. -2D echo.  Carotid ultrasound. -Fall precautions -If mental status does not improve, might need neurology evaluation -Check vitamin B12, folate, TSH levels. -PT/OT/SLP evaluation  Elevated lactate present on admission -Questionable cause.  Resolved with IV fluids.  It is unclear if the patient had a seizure.  No evidence of seizure since admission  Hypokalemia -Replace.  Repeat a.m. labs  Hypertension -Monitor blood pressure.  Blood pressure on the higher side.  Will resume home antihypertensive  COPD -Stable.  No wheezing.  Osteoarthritis with chronic pain -Tramadol on on hold with concern for seizure.  DVT prophylaxis: Lovenox Code Status: DNR, as per patient's husband  at bedside Family Communication: none at bedside Disposition Plan:  depends on clinical outcome  Consultants: None  Procedures: None  Antimicrobials: None   Subjective: Patient seen and examined at bedside.  She is more awake but slightly confused.  No overnight fever or vomiting.  Objective: Vitals:   04/25/17 1959 04/26/17 0001 04/26/17 0508 04/26/17 0719  BP: (!) 166/68 (!) 144/79 (!) 150/78 (!) 154/77  Pulse: 74 95 76 93  Resp: 16 16 16 18   Temp: 98.2 F (36.8 C) 98.6 F (37 C) 98.7 F (37.1 C) 98.6 F (37 C)  TempSrc: Oral Axillary Axillary Axillary  SpO2: 100% 99% 96% 98%  Weight:        Intake/Output Summary (Last 24 hours) at 04/26/2017 1008 Last data filed at 04/25/2017 1700 Gross per 24 hour  Intake 0 ml  Output -  Net 0 ml   Filed Weights   04/25/17 1700  Weight: 47.6 kg (104 lb 15 oz)    Examination:  General exam: Elderly female lying in bed, awake but slightly confused  respiratory system: Bilateral decreased breath sounds at bases Cardiovascular system: S1 & S2 heard, rate controlled  gastrointestinal system: Abdomen is nondistended, soft and nontender. Normal bowel sounds heard. Central nervous system: awake but slightly confused. No focal neurological deficits. Moving extremities Extremities: No cyanosis, clubbing, edema  Skin: Abrasions/Lacerations in the nose, and right eye area Lymph: no cervical lymphadenopathy    Data Reviewed: I have personally reviewed following labs and imaging studies  CBC: Recent Labs  Lab 04/24/17 2316 04/26/17 0240  WBC  9.5 9.8  NEUTROABS 7.2  --   HGB 12.2 12.4  HCT 38.3 38.5  MCV 91.6 91.4  PLT 257 252   Basic Metabolic Panel: Recent Labs  Lab 04/24/17 2316 04/25/17 1952 04/26/17 0240  NA 138 139 140  K 2.9* 4.0 3.4*  CL 100* 104 107  CO2 26 24 21*  GLUCOSE 175* 104* 96  BUN 24* 12 14  CREATININE 1.04* 0.76 0.75  CALCIUM 9.2 9.3 9.2  MG  --  1.9  --    GFR: CrCl cannot be calculated  (Unknown ideal weight.). Liver Function Tests: Recent Labs  Lab 04/24/17 2316  AST 33  ALT 14  ALKPHOS 93  BILITOT 0.6  PROT 6.6  ALBUMIN 3.7   No results for input(s): LIPASE, AMYLASE in the last 168 hours. No results for input(s): AMMONIA in the last 168 hours. Coagulation Profile: No results for input(s): INR, PROTIME in the last 168 hours. Cardiac Enzymes: Recent Labs  Lab 04/25/17 1952 04/26/17 0240  TROPONINI <0.03 <0.03   BNP (last 3 results) No results for input(s): PROBNP in the last 8760 hours. HbA1C: No results for input(s): HGBA1C in the last 72 hours. CBG: No results for input(s): GLUCAP in the last 168 hours. Lipid Profile: No results for input(s): CHOL, HDL, LDLCALC, TRIG, CHOLHDL, LDLDIRECT in the last 72 hours. Thyroid Function Tests: No results for input(s): TSH, T4TOTAL, FREET4, T3FREE, THYROIDAB in the last 72 hours. Anemia Panel: No results for input(s): VITAMINB12, FOLATE, FERRITIN, TIBC, IRON, RETICCTPCT in the last 72 hours. Sepsis Labs: Recent Labs  Lab 04/24/17 2332 04/25/17 0132  LATICACIDVEN 3.15* 1.10    Recent Results (from the past 240 hour(s))  Urine culture     Status: Abnormal   Collection Time: 04/25/17  8:52 AM  Result Value Ref Range Status   Specimen Description URINE, CATHETERIZED  Final   Special Requests NONE  Final   Culture (A)  Final    <10,000 COLONIES/mL INSIGNIFICANT GROWTH Performed at [facility], [address], [place], [place] [address]    Report Status 04/26/2017 FINAL  Final         Radiology Studies: Ct Head Wo Contrast  Result Date: 04/24/2017 CLINICAL DATA:  84 year old female with head trauma. EXAM: CT HEAD WITHOUT CONTRAST CT CERVICAL SPINE WITHOUT CONTRAST TECHNIQUE: Multidetector CT imaging of the head and cervical spine was performed following the standard protocol without intravenous contrast. Multiplanar CT image reconstructions of the cervical spine were also generated. COMPARISON:   None. FINDINGS: CT HEAD FINDINGS Brain: There is mild age-related atrophy and chronic microvascular ischemic changes. There is no acute intracranial hemorrhage. No mass effect or midline shift noted. No extra-axial fluid collection. Vascular: No hyperdense vessel or unexpected calcification. Skull: Normal. Negative for fracture or focal lesion. Sinuses/Orbits: The visualized paranasal sinuses and mastoid air cells are clear. Other: Skin laceration over the forehead. CT CERVICAL SPINE FINDINGS Alignment: No acute subluxation. Grade 1 C5-C6 and C7-T1 anterolisthesis. Skull base and vertebrae: No acute fracture.  Osteopenia. Soft tissues and spinal canal: No prevertebral fluid or swelling. No visible canal hematoma. Disc levels: Multilevel degenerative changes most prominent at C6-C7 where there is disc space loss and endplate irregularity. Multilevel facet hypertrophy. Upper chest: Negative. Other: Bilateral carotid bulb calcified plaques. IMPRESSION: 1. No acute intracranial hemorrhage. 2. Mild age-related atrophy and chronic microvascular ischemic changes. 3. No acute/traumatic cervical spine pathology. Multilevel degenerative changes. Electronically Signed   By: Elgie Collard M.D.   On: 04/24/2017  23:53   Ct Cervical Spine Wo Contrast  Result Date: 04/24/2017 CLINICAL DATA:  84 year old female with head trauma. EXAM: CT HEAD WITHOUT CONTRAST CT CERVICAL SPINE WITHOUT CONTRAST TECHNIQUE: Multidetector CT imaging of the head and cervical spine was performed following the standard protocol without intravenous contrast. Multiplanar CT image reconstructions of the cervical spine were also generated. COMPARISON:  None. FINDINGS: CT HEAD FINDINGS Brain: There is mild age-related atrophy and chronic microvascular ischemic changes. There is no acute intracranial hemorrhage. No mass effect or midline shift noted. No extra-axial fluid collection. Vascular: No hyperdense vessel or unexpected calcification. Skull:  Normal. Negative for fracture or focal lesion. Sinuses/Orbits: The visualized paranasal sinuses and mastoid air cells are clear. Other: Skin laceration over the forehead. CT CERVICAL SPINE FINDINGS Alignment: No acute subluxation. Grade 1 C5-C6 and C7-T1 anterolisthesis. Skull base and vertebrae: No acute fracture.  Osteopenia. Soft tissues and spinal canal: No prevertebral fluid or swelling. No visible canal hematoma. Disc levels: Multilevel degenerative changes most prominent at C6-C7 where there is disc space loss and endplate irregularity. Multilevel facet hypertrophy. Upper chest: Negative. Other: Bilateral carotid bulb calcified plaques. IMPRESSION: 1. No acute intracranial hemorrhage. 2. Mild age-related atrophy and chronic microvascular ischemic changes. 3. No acute/traumatic cervical spine pathology. Multilevel degenerative changes. Electronically Signed   By: Elgie CollardArash  Radparvar M.D.   On: 04/24/2017 23:53        Scheduled Meds: . azelastine  1 spray Each Nare BID  . enoxaparin (LOVENOX) injection  30 mg Subcutaneous Q24H  . pantoprazole (PROTONIX) IV  40 mg Intravenous Q24H   Continuous Infusions: . sodium chloride 75 mL/hr at 04/25/17 1832     LOS: 0 days        Glade LloydKshitiz Mittie Knittel, MD [facility] Pager [phone]  If 7PM-7AM, please contact night-coverage [email or web address] Password [email or web address] 04/26/2017, 10:08 AM

## 2017-04-26 NOTE — Progress Notes (Signed)
  Echocardiogram 2D Echocardiogram has been performed.  Brionne Mertz T Elmina Hendel 04/26/2017, 4:35 PM

## 2017-04-26 NOTE — NC FL2 (Signed)
[place] MEDICAID FL2 LEVEL OF CARE SCREENING TOOL     IDENTIFICATION  Patient Name: Lindsey Norris Birthdate: 1926/10/07 Sex: female Admission Date (Current Location): 04/24/2017  [facility] and [place] Number:  Producer, television/film/video and Address:  The . [facility], [address], [place], [place] [address]      Provider Number: [id number]  Attending Physician Name and Address:  Glade Lloyd, MD  Relative Name and Phone Number:       Current Level of Care: Hospital Recommended Level of Care: Skilled Nursing Facility Prior Approval Number:    Date Approved/Denied:   PASRR Number: [id number]  Discharge Plan: SNF    Current Diagnoses: Patient Active Problem List   Diagnosis Date Noted  . Seizures (HCC) 04/25/2017  . Syncope 04/25/2017  . Altered mental status 04/25/2017  . Arthritis   . Seasonal and perennial allergic rhinitis 05/09/2010  . Hyperlipemia 06/11/2007  . Hypertension 06/11/2007  . HEMORRHOIDS 06/11/2007  . COPD, MILD 06/11/2007  . OSTEOARTHRITIS 06/11/2007  . Personal history of colonic polyps 01/15/2007  . Asthma, mild intermittent, well-controlled 01/15/2007  . GERD 01/15/2007  . GASTRITIS, CHRONIC 04/22/2001    Orientation RESPIRATION BLADDER Height & Weight     Self, Place  Normal Incontinent Weight: 104 lb 15 oz (47.6 kg) Height:     BEHAVIORAL SYMPTOMS/MOOD NEUROLOGICAL BOWEL NUTRITION STATUS    Convulsions/Seizures Continent Diet(heart healthy)  AMBULATORY STATUS COMMUNICATION OF NEEDS Skin   Limited Assist Verbally Skin abrasions                       Personal Care Assistance Level of Assistance  Bathing, Feeding, Dressing Bathing Assistance: Limited assistance Feeding assistance: Limited assistance Dressing Assistance: Limited assistance     Functional Limitations Info  Sight, Hearing, Speech Sight Info: Adequate Hearing Info: Adequate Speech Info: Adequate    SPECIAL CARE FACTORS FREQUENCY  PT  (By licensed PT), OT (By licensed OT)     PT Frequency: 5x/wk OT Frequency: 5x/wk            Contractures Contractures Info: Not present    Additional Factors Info  Code Status, Allergies Code Status Info: DNR Allergies Info: Sulfonamide Derivatives           Current Medications (04/26/2017):  This is the current hospital active medication list Current Facility-Administered Medications  Medication Dose Route Frequency Provider Last Rate Last Dose  . 0.9 %  sodium chloride infusion   Intravenous Continuous Marcos Eke, PA-C 75 mL/hr at 04/26/17 1226    . acetaminophen (TYLENOL) tablet 650 mg  650 mg Oral Q6H PRN Marcos Eke, PA-C   650 mg at 04/26/17 1223   Or  . acetaminophen (TYLENOL) suppository 650 mg  650 mg Rectal Q6H PRN Marcos Eke, PA-C      . albuterol (PROVENTIL) (2.5 MG/3ML) 0.083% nebulizer solution 2.5 mg  2.5 mg Nebulization Q4H PRN Marcos Eke, PA-C      . azelastine (ASTELIN) 0.1 % nasal spray 1 spray  1 spray Each Nare BID Marcos Eke, PA-C   1 spray at 04/25/17 2159  . bisacodyl (DULCOLAX) suppository 10 mg  10 mg Rectal Daily PRN Marcos Eke, PA-C      . enoxaparin (LOVENOX) injection 30 mg  30 mg Subcutaneous Q24H Marcos Eke, PA-C   30 mg at 04/25/17 2159  . LORazepam (ATIVAN) injection 1-2 mg  1-2 mg Intravenous Q2H PRN Marcos Eke,  PA-C      . ondansetron (ZOFRAN) tablet 4 mg  4 mg Oral Q6H PRN Marcos EkeWertman, Sara E, PA-C       Or  . ondansetron (ZOFRAN) injection 4 mg  4 mg Intravenous Q6H PRN Marcos EkeWertman, Sara E, PA-C      . pantoprazole (PROTONIX) injection 40 mg  40 mg Intravenous Q24H Marlowe KaysWertman, Sara E, PA-C   40 mg at 04/26/17 1223  . senna-docusate (Senokot-S) tablet 1 tablet  1 tablet Oral QHS PRN Marcos EkeWertman, Sara E, PA-C         Discharge Medications: Please see discharge summary for a list of discharge medications.  Relevant Imaging Results:  Relevant Lab Results:   Additional Information SS#: [id number]  Baldemar LenisElizabeth M  Keiston Manley, LCSW

## 2017-04-26 NOTE — Evaluation (Signed)
Clinical/Bedside Swallow Evaluation Patient Details  Name: Lindsey Norris MRN: [id number] Date of Birth: Oct 18, 1926  Today's Date: 04/26/2017 Time: SLP Start Time (ACUTE ONLY): 1110 SLP Stop Time (ACUTE ONLY): 1135 SLP Time Calculation (min) (ACUTE ONLY): 25 min  Past Medical History:  Past Medical History:  Diagnosis Date  . Arthritis   . Chronic gastritis   . Colon polyp    dimnutive 2mm  . COPD (chronic obstructive pulmonary disease) (HCC)   . Duodenitis without hemorrhage   . GERD (gastroesophageal reflux disease)   . Hemorrhoids, internal    & external  . Hypercholesteremia   . Hypertension   . Osteoporosis 2013/2015/2017   2013 T score -2.5, 2015 T score - 2.4, 2017 T score -2.3 improved at both hips from prior study  . Sclerosis of the skin    LICHENS SCLEROSIS  . Urticaria    Past Surgical History:  Past Surgical History:  Procedure Laterality Date  . APPENDECTOMY    . CATARACT EXTRACTION     bilateral x 2  . COLONOSCOPY    . DILATION AND CURETTAGE OF UTERUS  1969  . ESOPHAGOGASTRODUODENOSCOPY    . NOSE SURGERY     DEVIATED SEPTUM REPAIR  . REFRACTIVE SURGERY     LASER EYE SURGERY X2/ 2010 AND [id number]   HPI:  84 year old female admitted 04/24/17 after being found post-ictal, face down at home. Husband reports pt has seemed altered for 2-3 weeks. PMH significant for HTN, HLD, COPD, lichen sclerosis, GERD, OA, osteoporosis. MRI negative for acute of focal lesion, moderate atrophy, remote lacunar infarct.   Assessment / Plan / Recommendation Clinical Impression  Pt presents with adequate oral motor strrngth and function. No overt s/s aspiration observed on any consistency tested. Pt's husband reports no history of swallowing difficulty. Recommend continuing regular diet and thin liquids, following routine safe swallow precautions. No further ST intervention is recommended at this time. Please reconsult if needs arise.    SLP Visit Diagnosis: Dysphagia, unspecified  (R13.10)    Aspiration Risk  Mild aspiration risk    Diet Recommendation Thin liquid;Regular   Liquid Administration via: Cup;Straw Medication Administration: Whole meds with liquid Supervision: Patient able to self feed Compensations: Minimize environmental distractions;Slow rate;Small sips/bites Postural Changes: Seated upright at 90 degrees;Remain upright for at least 30 minutes after po intake    Other  Recommendations Oral Care Recommendations: Oral care BID   Follow up Recommendations 24 hour supervision/assistance             Prognosis Prognosis for Safe Diet Advancement: Good Barriers to Reach Goals: Cognitive deficits      Swallow Study   General Date of Onset: 04/24/17 HPI: 84 year old female admitted 04/24/17 after being found post-ictal, face down at home. Husband reports pt has seemed altered for 2-3 weeks. PMh significant for HTN, HLD, COPD, lichen sclerosis, GERD, OA, osteoporosis. MRI negative for acute of focal lesion, moderate atrophy, remote lacunar infarct. Type of Study: Bedside Swallow Evaluation Previous Swallow Assessment: none found Diet Prior to this Study: Regular;Thin liquids Temperature Spikes Noted: No Respiratory Status: Room air History of Recent Intubation: No Behavior/Cognition: Alert;Cooperative;Pleasant mood;Confused;Requires cueing Oral Cavity Assessment: Within Functional Limits Oral Care Completed by SLP: No Oral Cavity - Dentition: Adequate natural dentition Vision: Functional for self-feeding Self-Feeding Abilities: Able to feed self Patient Positioning: Upright in bed Baseline Vocal Quality: Normal Volitional Cough: Cognitively unable to elicit Volitional Swallow: Unable to elicit    Oral/Motor/Sensory Function Overall Oral Motor/Sensory  Function: Within functional limits   Ice Chips Ice chips: Not tested   Thin Liquid Thin Liquid: Within functional limits Presentation: Straw    Nectar Thick Nectar Thick Liquid: Not tested    Honey Thick Honey Thick Liquid: Not tested   Puree Puree: Within functional limits Presentation: Spoon;Self Fed   Solid   GO   Solid: Within functional limits Presentation: Self Fed;Spoon       Kiko Ripp B. Murvin Natal [facility], CCC-SLP Speech Language Pathologist [phone]  Leigh Aurora 04/26/2017,11:43 AM

## 2017-04-27 DIAGNOSIS — R55 Syncope and collapse: Secondary | ICD-10-CM | POA: Diagnosis not present

## 2017-04-27 DIAGNOSIS — E876 Hypokalemia: Secondary | ICD-10-CM | POA: Diagnosis not present

## 2017-04-27 DIAGNOSIS — F0390 Unspecified dementia without behavioral disturbance: Secondary | ICD-10-CM

## 2017-04-27 DIAGNOSIS — I10 Essential (primary) hypertension: Secondary | ICD-10-CM | POA: Diagnosis not present

## 2017-04-27 DIAGNOSIS — R4182 Altered mental status, unspecified: Secondary | ICD-10-CM | POA: Diagnosis not present

## 2017-04-27 LAB — COMPREHENSIVE METABOLIC PANEL
ALK PHOS: 68 U/L (ref 38–126)
ALT: 16 U/L (ref 14–54)
ANION GAP: 11 (ref 5–15)
AST: 21 U/L (ref 15–41)
Albumin: 3.3 g/dL — ABNORMAL LOW (ref 3.5–5.0)
BILIRUBIN TOTAL: 0.9 mg/dL (ref 0.3–1.2)
BUN: 14 mg/dL (ref 6–20)
CALCIUM: 9 mg/dL (ref 8.9–10.3)
CO2: 23 mmol/L (ref 22–32)
Chloride: 105 mmol/L (ref 101–111)
Creatinine, Ser: 0.76 mg/dL (ref 0.44–1.00)
Glucose, Bld: 99 mg/dL (ref 65–99)
Potassium: 3.8 mmol/L (ref 3.5–5.1)
Sodium: 139 mmol/L (ref 135–145)
TOTAL PROTEIN: 6 g/dL — AB (ref 6.5–8.1)

## 2017-04-27 LAB — CBC WITH DIFFERENTIAL/PLATELET
Basophils Absolute: 0.1 10*3/uL (ref 0.0–0.1)
Basophils Relative: 1 %
Eosinophils Absolute: 0.1 10*3/uL (ref 0.0–0.7)
Eosinophils Relative: 1 %
HCT: 41.8 % (ref 36.0–46.0)
Hemoglobin: 13.4 g/dL (ref 12.0–15.0)
LYMPHS ABS: 2.2 10*3/uL (ref 0.7–4.0)
LYMPHS PCT: 27 %
MCH: 29.8 pg (ref 26.0–34.0)
MCHC: 32.1 g/dL (ref 30.0–36.0)
MCV: 92.9 fL (ref 78.0–100.0)
MONOS PCT: 7 %
Monocytes Absolute: 0.6 10*3/uL (ref 0.1–1.0)
NEUTROS PCT: 64 %
Neutro Abs: 5.3 10*3/uL (ref 1.7–7.7)
Platelets: 232 10*3/uL (ref 150–400)
RBC: 4.5 MIL/uL (ref 3.87–5.11)
RDW: 15.3 % (ref 11.5–15.5)
WBC: 8.2 10*3/uL (ref 4.0–10.5)

## 2017-04-27 LAB — MAGNESIUM: MAGNESIUM: 1.7 mg/dL (ref 1.7–2.4)

## 2017-04-27 LAB — TSH: TSH: 2.18 u[IU]/mL (ref 0.350–4.500)

## 2017-04-27 LAB — VITAMIN B12: Vitamin B-12: 559 pg/mL (ref 180–914)

## 2017-04-27 MED ORDER — TRIAMTERENE-HCTZ 37.5-25 MG PO TABS: 1.0000 | ORAL_TABLET | Freq: Every day | ORAL | Status: DC

## 2017-04-27 MED ORDER — ACETAMINOPHEN 325 MG PO TABS: 650.0000 mg | ORAL_TABLET | Freq: Four times a day (QID) | ORAL | 0 refills | Status: AC | PRN

## 2017-04-27 MED ORDER — ALBUTEROL SULFATE HFA 108 (90 BASE) MCG/ACT IN AERS: 2.0000 | INHALATION_SPRAY | RESPIRATORY_TRACT | Status: DC | PRN

## 2017-04-27 MED ORDER — TRIAMTERENE-HCTZ 37.5-25 MG PO CAPS
1.0000 | ORAL_CAPSULE | Freq: Every day | ORAL | Status: DC
  Administered 2017-04-27: 1 via ORAL
  Filled 2017-04-27 (×3): qty 1

## 2017-04-27 MED ORDER — PANTOPRAZOLE SODIUM 40 MG PO TBEC: 40.0000 mg | DELAYED_RELEASE_TABLET | Freq: Every day | ORAL | Status: DC

## 2017-04-27 NOTE — Discharge Summary (Signed)
Physician Discharge Summary  Lindsey Norris ZOX:[id number] DOB: 1926-04-24 DOA: 04/24/2017  PCP: Gaspar Garbe, MD  Admit date: 04/24/2017 Discharge date: 04/27/2017  Admitted From: Home Disposition:  SNF  Recommendations for Outpatient Follow-up:  1. Follow up with nursing home provider at earliest convenience 2. Patient will benefit from outpatient evaluation and follow-up by neurology 3. Fall precautions   Home Health: No Equipment/Devices: None  Discharge Condition: Guarded CODE STATUS: DNR Diet recommendation: Heart Healthy /as per SLP recommendations  Brief/Interim Summary: 84 year old female with history of hypertension, hyperlipidemia, COPD, GERD, osteoarthritis, osteoporosis presented after being found on the floor unresponsive by her husband.  Syncopal episode was suspected.  Patient has been more confused over the last 2-3 weeks after her tramadol was increased from every 6 hours to every 4 hours as needed for pain.  There was no witnessed episode of seizure.  Patient was empirically started on Keppra in the ED.  CT of the head and cervical spine was negative for acute abnormality.  There was no witnessed seizure episode during the hospitalization.  Keppra was not continued.  MRI of the brain was negative for acute events but suggestive of chronic microvascular ischemia.  EEG was negative for seizure-like activity.  Patient will benefit from outpatient evaluation by neurology.  Patient will be discharged to nursing home once bed is available.     Discharge Diagnoses:  Principal Problem:   Seizures (HCC) Active Problems:   Hyperlipemia   Hypertension   GERD   Arthritis   Syncope   Altered mental status   Syncope resulting in facial lacerations -Questionable cause.  CT of the head/cervical spine was negative for acute abnormality. EEG is negative for seizures.  It is unclear if the patient had a seizure episode.  MRI of the brain was negative for acute events but  suggestive of chronic microvascular ischemia.  Empirically was given a dose of Keppra in the ED.  We will hold off on continuing Keppra for now. -Echo showed EF of 65-70% with grade 1 diastolic dysfunction -Bilateral carotid duplex: 1-39% ICA stenosis -Fall precautions -Patient is more awake, slightly confused.  Probably is her baseline mental status.  Will hold off on neurology evaluation. Patient will benefit from outpatient evaluation by neurology.  Patient will be discharged to nursing home once bed is available. -vitamin B12 and TSH levels are normal. -Diet as per SLP evaluation  Probable dementia -Outpatient neurology evaluation.  Monitor mental status  Elevated lactate present on admission -Questionable cause.  Resolved with IV fluids.  It is unclear if the patient had a seizure.  No evidence of seizure since admission  Hypokalemia -Replaced.  Improved.  Hypertension -Monitor blood pressure.  Resume home antihypertensives on discharge.  COPD -Stable.  No wheezing.  Osteoarthritis with chronic pain -Tramadol on on hold with concern for seizure.   Discharge Instructions  Discharge Instructions    Ambulatory referral to Neurology   Complete by:  As directed    An appointment is requested in approximately: 1 week   Ambulatory referral to Neurology   Complete by:  As directed    An appointment is requested in approximately: 1-2 weeks. Recent syncope, hospital admission. Progressive dementia   Call MD for:  difficulty breathing, headache or visual disturbances   Complete by:  As directed    Call MD for:  extreme fatigue   Complete by:  As directed    Call MD for:  hives   Complete by:  As directed  Call MD for:  persistant dizziness or light-headedness   Complete by:  As directed    Call MD for:  persistant nausea and vomiting   Complete by:  As directed    Call MD for:  severe uncontrolled pain   Complete by:  As directed    Call MD for:  temperature >100.4    Complete by:  As directed    Diet - low sodium heart healthy   Complete by:  As directed    Discharge instructions   Complete by:  As directed    Fall precautions   Increase activity slowly   Complete by:  As directed      Allergies as of 04/27/2017      Reactions   Sulfonamide Derivatives    REACTION: vomiting      Medication List    STOP taking these medications   clobetasol ointment 0.05 % Commonly known as:  TEMOVATE   traMADol 50 MG tablet Commonly known as:  ULTRAM     TAKE these medications   acetaminophen 325 MG tablet Commonly known as:  TYLENOL Take 2 tablets (650 mg total) by mouth every 6 (six) hours as needed for mild pain (or Fever >/= 101).   albuterol 108 (90 Base) MCG/ACT inhaler Commonly known as:  PROAIR HFA Inhale 2 puffs into the lungs every 4 (four) hours as needed for wheezing or shortness of breath.   azelastine 0.1 % nasal spray Commonly known as:  ASTELIN Place 1 spray into both nostrils 2 (two) times daily. Use in each nostril as directed   celecoxib 200 MG capsule Commonly known as:  CELEBREX Take 200 mg by mouth daily as needed.   denosumab 60 MG/ML Soln injection Commonly known as:  PROLIA Inject 60 mg into the skin every 6 (six) months. Administer in upper arm, thigh, or abdomen   EPINEPHrine 0.3 mg/0.3 mL Soaj injection Commonly known as:  EPIPEN 2-PAK Inject into thigh if needed for severe allergic reaction   multivitamin capsule Take 1 capsule by mouth daily.   NEXIUM 40 MG capsule Generic drug:  esomeprazole TAKE 1 CAPSULE EVERY DAY 30 MINUTES BEFORE A MEAL   triamterene-hydrochlorothiazide 37.5-25 MG capsule Commonly known as:  DYAZIDE Take 1 capsule by mouth daily.      Follow-up Information    Tisovec, Adelfa Kohichard W, MD Follow up on 05/01/2017.   Specialty:  Internal Medicine Why:  @12 :00 Contact information: [address] [place] [address] [phone]        [facility] Follow up.    Specialty:  Neurology Contact information: [address] [place] [id number] [phone]       Dorann OuWinston, [facility] Follow up.   Specialty:  Home Health Services Why:  home health services Contact information: [address] [place] [address] [phone]          Allergies  Allergen Reactions  . Sulfonamide Derivatives     REACTION: vomiting    Consultations:  None   Procedures/Studies: Ct Head Wo Contrast  Result Date: 04/24/2017 CLINICAL DATA:  84 year old female with head trauma. EXAM: CT HEAD WITHOUT CONTRAST CT CERVICAL SPINE WITHOUT CONTRAST TECHNIQUE: Multidetector CT imaging of the head and cervical spine was performed following the standard protocol without intravenous contrast. Multiplanar CT image reconstructions of the cervical spine were also generated. COMPARISON:  None. FINDINGS: CT HEAD FINDINGS Brain: There is mild age-related atrophy and chronic microvascular ischemic changes. There is no  acute intracranial hemorrhage. No mass effect or midline shift noted. No extra-axial fluid collection. Vascular: No hyperdense vessel or unexpected calcification. Skull: Normal. Negative for fracture or focal lesion. Sinuses/Orbits: The visualized paranasal sinuses and mastoid air cells are clear. Other: Skin laceration over the forehead. CT CERVICAL SPINE FINDINGS Alignment: No acute subluxation. Grade 1 C5-C6 and C7-T1 anterolisthesis. Skull base and vertebrae: No acute fracture.  Osteopenia. Soft tissues and spinal canal: No prevertebral fluid or swelling. No visible canal hematoma. Disc levels: Multilevel degenerative changes most prominent at C6-C7 where there is disc space loss and endplate irregularity. Multilevel facet hypertrophy. Upper chest: Negative. Other: Bilateral carotid bulb calcified plaques. IMPRESSION: 1. No acute intracranial hemorrhage. 2. Mild age-related atrophy and chronic microvascular ischemic  changes. 3. No acute/traumatic cervical spine pathology. Multilevel degenerative changes. Electronically Signed   By: Elgie Collard M.D.   On: 04/24/2017 23:53   Ct Cervical Spine Wo Contrast  Result Date: 04/24/2017 CLINICAL DATA:  84 year old female with head trauma. EXAM: CT HEAD WITHOUT CONTRAST CT CERVICAL SPINE WITHOUT CONTRAST TECHNIQUE: Multidetector CT imaging of the head and cervical spine was performed following the standard protocol without intravenous contrast. Multiplanar CT image reconstructions of the cervical spine were also generated. COMPARISON:  None. FINDINGS: CT HEAD FINDINGS Brain: There is mild age-related atrophy and chronic microvascular ischemic changes. There is no acute intracranial hemorrhage. No mass effect or midline shift noted. No extra-axial fluid collection. Vascular: No hyperdense vessel or unexpected calcification. Skull: Normal. Negative for fracture or focal lesion. Sinuses/Orbits: The visualized paranasal sinuses and mastoid air cells are clear. Other: Skin laceration over the forehead. CT CERVICAL SPINE FINDINGS Alignment: No acute subluxation. Grade 1 C5-C6 and C7-T1 anterolisthesis. Skull base and vertebrae: No acute fracture.  Osteopenia. Soft tissues and spinal canal: No prevertebral fluid or swelling. No visible canal hematoma. Disc levels: Multilevel degenerative changes most prominent at C6-C7 where there is disc space loss and endplate irregularity. Multilevel facet hypertrophy. Upper chest: Negative. Other: Bilateral carotid bulb calcified plaques. IMPRESSION: 1. No acute intracranial hemorrhage. 2. Mild age-related atrophy and chronic microvascular ischemic changes. 3. No acute/traumatic cervical spine pathology. Multilevel degenerative changes. Electronically Signed   By: Elgie Collard M.D.   On: 04/24/2017 23:53   Mr Brain Wo Contrast  Result Date: 04/26/2017 CLINICAL DATA:  Altered level of consciousness.  Confusion. EXAM: MRI HEAD WITHOUT CONTRAST  TECHNIQUE: Multiplanar, multiecho pulse sequences of the brain and surrounding structures were obtained without intravenous contrast. COMPARISON:  CT head without contrast 04/24/2017 FINDINGS: Brain: Moderate atrophy and white matter disease is present bilaterally. No acute intracranial abnormality is present. There is no acute infarct, hemorrhage, or mass lesion. A remote lacunar infarct is present in the right side of the splenium of the corpus callosum. Internal auditory canals are within normal limits bilaterally. The brainstem and cerebellum are normal. No significant extra-axial fluid collection is present. The ventricles are proportionate to the degree of atrophy. Vascular: Flow is present in the major intracranial arteries. Skull and upper cervical spine: The skull base is within normal limits. Exaggerated cervical lordosis is present with minimal degenerative change in the cervical spine otherwise. Marrow signal is normal. Sinuses/Orbits: The paranasal sinuses and mastoid air cells are clear. Bilateral lens replacements are present. Globes and orbits are otherwise within normal limits. IMPRESSION: 1. No acute or focal lesion to explain the patient's altered mental status or recent changes. 2. Moderate atrophy and white matter disease likely reflects the sequela of chronic microvascular ischemia. 3. Remote  lacunar infarct involving the right side of the splenium of the corpus callosum. Electronically Signed   By: Marin Roberts M.D.   On: 04/26/2017 10:33    Echo on 04/26/2017 Study Conclusions  - Left ventricle: The cavity size was normal. There was mild concentric hypertrophy. Systolic function was vigorous. The estimated ejection fraction was in the range of 65% to 70%. There was dynamic obstruction at rest, with a peak velocity of 138 cm/sec and a peak gradient of 8 mm Hg. Wall motion was normal; there were no regional wall motion abnormalities. Doppler parameters are  consistent with abnormal left ventricular relaxation (grade 1 diastolic dysfunction). Doppler parameters are consistent with indeterminate ventricular filling pressure. - Aortic valve: Transvalvular velocity was within the normal range. There was no stenosis. There was no regurgitation. - Mitral valve: Transvalvular velocity was within the normal range. There was no evidence for stenosis. There was mild regurgitation. - Left atrium: The atrium was mildly dilated. - Right ventricle: The cavity size was normal. Wall thickness was normal. Systolic function was normal. - Atrial septum: There was increased thickness of the septum, consistent with lipomatous hypertrophy. No defect or patent foramen ovale was identified. - Tricuspid valve: There was mild regurgitation. - Pulmonary arteries: Systolic pressure was within the normal range. PA peak pressure: 29 mm Hg (S).  Bilateral carotid duplex: 1-39% ICA stenosis  EEG on 04/25/2017 Clinical History:  Found down on the ground,confused, and with tongue bite.  CT negative for acute hemorrhage.  Technical Summary:  A 19 channel digital EEG recording was performed using the 10-20 international system of electrode placement.  Bipolar and Referential montages were used.  The total recording time was approx 20 minutes.  Findings:  There is a posterior dominant rhythm of 8 Hz reactive to eye opening and closure.  No focal slowing is present.  Photic stimulation and hyperventilation are not performed.  Sleep is not recorded. There are no epileptiform discharges or electrographic seizures present.    Impression:  This is a normal EEG.  However, this does not rule out seizures.  If clinical suspicion remains high then a sleep deprived EEG and/or prolonged ambulatory EEG may be of additional value.       Subjective: Patient seen and examined at bedside.  She is more awake, only very slightly confused.  No overnight fever, chest pain or  shortness of breath or vomiting.    Discharge Exam: Vitals:   04/27/17 0810 04/27/17 1306  BP: (!) 152/86 (!) 171/78  Pulse: 90 80  Resp: 16 20  Temp: 99 F (37.2 C) 98.8 F (37.1 C)  SpO2: 100% 100%   Vitals:   04/27/17 0041 04/27/17 0527 04/27/17 0810 04/27/17 1306  BP: (!) 168/74 (!) 156/62 (!) 152/86 (!) 171/78  Pulse: 83 67 90 80  Resp: 16 16 16 20   Temp: 99.5 F (37.5 C) 98.9 F (37.2 C) 99 F (37.2 C) 98.8 F (37.1 C)  TempSrc: Axillary Axillary Oral Oral  SpO2: 98% 99% 100% 100%  Weight:        General exam: Elderly female lying in bed, awake but slightly confused.  No acute distress respiratory system: Bilateral decreased breath sounds at bases Cardiovascular system: S1 & S2 heard, rate controlled  gastrointestinal system: Abdomen is nondistended, soft and nontender. Normal bowel sounds heard. Extremities: No cyanosis, clubbing, edema       The results of significant diagnostics from this hospitalization (including imaging, microbiology, ancillary and laboratory) are listed below for reference.  Microbiology: Recent Results (from the past 240 hour(s))  Urine culture     Status: Abnormal   Collection Time: 04/25/17  8:52 AM  Result Value Ref Range Status   Specimen Description URINE, CATHETERIZED  Final   Special Requests NONE  Final   Culture (A)  Final    <10,000 COLONIES/mL INSIGNIFICANT GROWTH Performed at [facility], [address], [place], [place] [address]    Report Status 04/26/2017 FINAL  Final     Labs: BNP (last 3 results) No results for input(s): BNP in the last 8760 hours. Basic Metabolic Panel: Recent Labs  Lab 04/24/17 2316 04/25/17 1952 04/26/17 0240 04/27/17 0431  NA 138 139 140 139  K 2.9* 4.0 3.4* 3.8  CL 100* 104 107 105  CO2 26 24 21* 23  GLUCOSE 175* 104* 96 99  BUN 24* 12 14 14   CREATININE 1.04* 0.76 0.75 0.76  CALCIUM 9.2 9.3 9.2 9.0  MG  --  1.9  --  1.7   Liver Function Tests: Recent Labs  Lab  04/24/17 2316 04/27/17 0431  AST 33 21  ALT 14 16  ALKPHOS 93 68  BILITOT 0.6 0.9  PROT 6.6 6.0*  ALBUMIN 3.7 3.3*   No results for input(s): LIPASE, AMYLASE in the last 168 hours. No results for input(s): AMMONIA in the last 168 hours. CBC: Recent Labs  Lab 04/24/17 2316 04/26/17 0240 04/27/17 0431  WBC 9.5 9.8 8.2  NEUTROABS 7.2  --  5.3  HGB 12.2 12.4 13.4  HCT 38.3 38.5 41.8  MCV 91.6 91.4 92.9  PLT 257 252 232   Cardiac Enzymes: Recent Labs  Lab 04/25/17 1952 04/26/17 0240  TROPONINI <0.03 <0.03   BNP: Invalid input(s): POCBNP CBG: No results for input(s): GLUCAP in the last 168 hours. D-Dimer No results for input(s): DDIMER in the last 72 hours. Hgb A1c No results for input(s): HGBA1C in the last 72 hours. Lipid Profile No results for input(s): CHOL, HDL, LDLCALC, TRIG, CHOLHDL, LDLDIRECT in the last 72 hours. Thyroid function studies Recent Labs    04/27/17 0432  TSH 2.180   Anemia work up Recent Labs    04/27/17 0431  VITAMINB12 559   Urinalysis    Component Value Date/Time   COLORURINE STRAW (A) 04/25/2017 0852   APPEARANCEUR CLEAR 04/25/2017 0852   LABSPEC 1.006 04/25/2017 0852   PHURINE 8.0 04/25/2017 0852   GLUCOSEU NEGATIVE 04/25/2017 0852   HGBUR NEGATIVE 04/25/2017 0852   BILIRUBINUR NEGATIVE 04/25/2017 0852   KETONESUR NEGATIVE 04/25/2017 0852   PROTEINUR NEGATIVE 04/25/2017 0852   UROBILINOGEN 0.2 08/12/2012 1116   NITRITE NEGATIVE 04/25/2017 0852   LEUKOCYTESUR NEGATIVE 04/25/2017 0852   Sepsis Labs Invalid input(s): PROCALCITONIN,  WBC,  LACTICIDVEN Microbiology Recent Results (from the past 240 hour(s))  Urine culture     Status: Abnormal   Collection Time: 04/25/17  8:52 AM  Result Value Ref Range Status   Specimen Description URINE, CATHETERIZED  Final   Special Requests NONE  Final   Culture (A)  Final    <10,000 COLONIES/mL INSIGNIFICANT GROWTH Performed at [facility], [address] [place], [place]  [address]    Report Status 04/26/2017 FINAL  Final     Time coordinating discharge: 35 minutes  SIGNED:   Glade Lloyd, MD  [facility] 04/27/2017, 3:17 PM Pager: [phone]  If 7PM-7AM, please contact night-coverage [email or web address] Password [email or web address]

## 2017-04-27 NOTE — Progress Notes (Signed)
Patient ID: Lindsey Norris, female   DOB: 08-03-1926, 84 y.o.   MRN: [id number]  PROGRESS NOTE    Yoshika Vensel Batdorf  NWG:[id number] DOB: [birth year] DOA: 04/24/2017 PCP: Gaspar Garbe, MD   Brief Narrative: 84 year old female with history of hypertension, hyperlipidemia, COPD, GERD, osteoarthritis, osteoporosis presented after being found on the floor unresponsive by her husband.  Syncopal episode was suspected.  Patient has been more confused over the last 2-3 weeks after her tramadol was increased from every 6 hours to every 4 hours as needed for pain.  There is no witnessed episode of seizure.  Patient was empirically started on Keppra.  CT of the head and cervical spine was negative for acute abnormality.  Assessment & Plan:   Principal Problem:   Seizures (HCC) Active Problems:   Hyperlipemia   Hypertension   GERD   Arthritis   Syncope   Altered mental status  Syncope resulting in facial lacerations -Questionable cause.  CT of the head/cervical spine was negative for acute abnormality EEG is negative for seizures.  It is unclear if the patient had a seizure episode.  MRI of the brain was negative for acute events but suggestive of chronic microvascular ischemia.  Empirically was given a dose of Keppra in the ED.  We will hold off on continuing Keppra for now. -Echo showed EF of 65-70% with grade 1 diastolic dysfunction -Bilateral carotid duplex: 1-39% ICA stenosis -Fall precautions -Patient is more awake, slightly confused.  Probably is her baseline mental status.  Will hold off on neurology evaluation. -vitamin B12 and TSH levels are normal. -PT/OT recommend nursing home placement.  Social worker consult  -Diet as per SLP evaluation  Elevated lactate present on admission -Questionable cause.  Resolved with IV fluids.  It is unclear if the patient had a seizure.  No evidence of seizure since admission  Hypokalemia -Replaced.  Improved.  Hypertension -Monitor blood  pressure. Will resume home antihypertensive  COPD -Stable.  No wheezing.  Osteoarthritis with chronic pain -Tramadol on on hold with concern for seizure.  DVT prophylaxis: Lovenox Code Status: DNR Family Communication: none at bedside Disposition Plan: Nursing home in 1-2 days  Consultants: None  Procedures:  Echo on 04/26/2017 Study Conclusions  - Left ventricle: The cavity size was normal. There was mild   concentric hypertrophy. Systolic function was vigorous. The   estimated ejection fraction was in the range of 65% to 70%. There   was dynamic obstruction at rest, with a peak velocity of 138   cm/sec and a peak gradient of 8 mm Hg. Wall motion was normal;   there were no regional wall motion abnormalities. Doppler   parameters are consistent with abnormal left ventricular   relaxation (grade 1 diastolic dysfunction). Doppler parameters   are consistent with indeterminate ventricular filling pressure. - Aortic valve: Transvalvular velocity was within the normal range.   There was no stenosis. There was no regurgitation. - Mitral valve: Transvalvular velocity was within the normal range.   There was no evidence for stenosis. There was mild regurgitation. - Left atrium: The atrium was mildly dilated. - Right ventricle: The cavity size was normal. Wall thickness was   normal. Systolic function was normal. - Atrial septum: There was increased thickness of the septum,   consistent with lipomatous hypertrophy. No defect or patent   foramen ovale was identified. - Tricuspid valve: There was mild regurgitation. - Pulmonary arteries: Systolic pressure was within the normal   range. PA peak pressure: 29  mm Hg (S).  Bilateral carotid duplex: 1-39% ICA stenosis  Antimicrobials: None   Subjective: Patient seen and examined at bedside.  She is more awake, only very slightly confused.  No overnight fever, chest pain or shortness of breath or vomiting.  Objective: Vitals:    04/26/17 2123 04/27/17 0041 04/27/17 0527 04/27/17 0810  BP: 128/68 (!) 168/74 (!) 156/62 (!) 152/86  Pulse: 79 83 67 90  Resp: 16 16 16 16   Temp: 100.3 F (37.9 C) 99.5 F (37.5 C) 98.9 F (37.2 C) 99 F (37.2 C)  TempSrc: Axillary Axillary Axillary Oral  SpO2: 97% 98% 99% 100%  Weight:        Intake/Output Summary (Last 24 hours) at 04/27/2017 1059 Last data filed at 04/26/2017 1400 Gross per 24 hour  Intake 60 ml  Output -  Net 60 ml   Filed Weights   04/25/17 1700  Weight: 47.6 kg (104 lb 15 oz)    Examination:  General exam: Elderly female lying in bed, awake but slightly confused.  No acute distress respiratory system: Bilateral decreased breath sounds at bases Cardiovascular system: S1 & S2 heard, rate controlled  gastrointestinal system: Abdomen is nondistended, soft and nontender. Normal bowel sounds heard. Extremities: No cyanosis, clubbing, edema   Data Reviewed: I have personally reviewed following labs and imaging studies  CBC: Recent Labs  Lab 04/24/17 2316 04/26/17 0240 04/27/17 0431  WBC 9.5 9.8 8.2  NEUTROABS 7.2  --  5.3  HGB 12.2 12.4 13.4  HCT 38.3 38.5 41.8  MCV 91.6 91.4 92.9  PLT 257 252 232   Basic Metabolic Panel: Recent Labs  Lab 04/24/17 2316 04/25/17 1952 04/26/17 0240 04/27/17 0431  NA 138 139 140 139  K 2.9* 4.0 3.4* 3.8  CL 100* 104 107 105  CO2 26 24 21* 23  GLUCOSE 175* 104* 96 99  BUN 24* 12 14 14   CREATININE 1.04* 0.76 0.75 0.76  CALCIUM 9.2 9.3 9.2 9.0  MG  --  1.9  --  1.7   GFR: CrCl cannot be calculated (Unknown ideal weight.). Liver Function Tests: Recent Labs  Lab 04/24/17 2316 04/27/17 0431  AST 33 21  ALT 14 16  ALKPHOS 93 68  BILITOT 0.6 0.9  PROT 6.6 6.0*  ALBUMIN 3.7 3.3*   No results for input(s): LIPASE, AMYLASE in the last 168 hours. No results for input(s): AMMONIA in the last 168 hours. Coagulation Profile: No results for input(s): INR, PROTIME in the last 168 hours. Cardiac  Enzymes: Recent Labs  Lab 04/25/17 1952 04/26/17 0240  TROPONINI <0.03 <0.03   BNP (last 3 results) No results for input(s): PROBNP in the last 8760 hours. HbA1C: No results for input(s): HGBA1C in the last 72 hours. CBG: No results for input(s): GLUCAP in the last 168 hours. Lipid Profile: No results for input(s): CHOL, HDL, LDLCALC, TRIG, CHOLHDL, LDLDIRECT in the last 72 hours. Thyroid Function Tests: Recent Labs    04/27/17 0432  TSH 2.180   Anemia Panel: Recent Labs    04/27/17 0431  VITAMINB12 559   Sepsis Labs: Recent Labs  Lab 04/24/17 2332 04/25/17 0132  LATICACIDVEN 3.15* 1.10    Recent Results (from the past 240 hour(s))  Urine culture     Status: Abnormal   Collection Time: 04/25/17  8:52 AM  Result Value Ref Range Status   Specimen Description URINE, CATHETERIZED  Final   Special Requests NONE  Final   Culture (A)  Final    <10,000  COLONIES/mL INSIGNIFICANT GROWTH Performed at [facility], [address] [place], [place] [address]    Report Status 04/26/2017 FINAL  Final         Radiology Studies: Mr Brain Wo Contrast  Result Date: 04/26/2017 CLINICAL DATA:  Altered level of consciousness.  Confusion. EXAM: MRI HEAD WITHOUT CONTRAST TECHNIQUE: Multiplanar, multiecho pulse sequences of the brain and surrounding structures were obtained without intravenous contrast. COMPARISON:  CT head without contrast 04/24/2017 FINDINGS: Brain: Moderate atrophy and white matter disease is present bilaterally. No acute intracranial abnormality is present. There is no acute infarct, hemorrhage, or mass lesion. A remote lacunar infarct is present in the right side of the splenium of the corpus callosum. Internal auditory canals are within normal limits bilaterally. The brainstem and cerebellum are normal. No significant extra-axial fluid collection is present. The ventricles are proportionate to the degree of atrophy. Vascular: Flow is present in the major  intracranial arteries. Skull and upper cervical spine: The skull base is within normal limits. Exaggerated cervical lordosis is present with minimal degenerative change in the cervical spine otherwise. Marrow signal is normal. Sinuses/Orbits: The paranasal sinuses and mastoid air cells are clear. Bilateral lens replacements are present. Globes and orbits are otherwise within normal limits. IMPRESSION: 1. No acute or focal lesion to explain the patient's altered mental status or recent changes. 2. Moderate atrophy and white matter disease likely reflects the sequela of chronic microvascular ischemia. 3. 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 Electronically Signed   By: Marin Robertshristopher  Mattern M.D.   On: 04/26/2017 10:33        Scheduled Meds: . azelastine  1 spray Each Nare BID  . enoxaparin (LOVENOX) injection  30 mg Subcutaneous Q24H  . pantoprazole (PROTONIX) IV  40 mg Intravenous Q24H   Continuous Infusions: . sodium chloride 75 mL/hr at 04/27/17 0057     LOS: 0 days        Glade LloydKshitiz Almendra Loria, MD [facility] Pager [phone]  If 7PM-7AM, please contact night-coverage [email or web address] Password [email or web address] 04/27/2017, 10:59 AM

## 2017-04-27 NOTE — Clinical Social Work Note (Signed)
Clinical Social Worker facilitated patient discharge including contacting patient family and facility to confirm patient discharge plans.  Clinical information faxed to facility and family agreeable with plan.  CSW arranged ambulance transport via [facility] to [facility].  RN to call [phone] for report prior to discharge. Patient going to room 3246.  Clinical Social Worker will sign off for now as social work intervention is no longer needed. Please consult us again if new need arises.  CarltonBridget Careen Mauch, [place] [phone]

## 2017-04-27 NOTE — Clinical Social Work Placement (Signed)
   CLINICAL SOCIAL WORK PLACEMENT  NOTE  Date:  04/27/2017  Patient Details  Name: Lindsey Norris MRN: [id number] Date of Birth: [birth year]  Clinical Social Work is seeking post-discharge placement for this patient at the Skilled  Nursing Facility level of care (*CSW will initial, date and re-position this form in  chart as items are completed):      Patient/family provided with [facility] Clinical Social Work Department's list of facilities offering this level of care within the geographic area requested by the patient (or if unable, by the patient's family).  Yes   Patient/family informed of their freedom to choose among providers that offer the needed level of care, that participate in Medicare, Medicaid or managed care program needed by the patient, have an available bed and are willing to accept the patient.      Patient/family informed of [facility] ownership interest in [facility] and [facility], as well as of the fact that they are under no obligation to receive care at these facilities.  PASRR submitted to EDS on       PASRR number received on 04/26/17     Existing PASRR number confirmed on       FL2 transmitted to all facilities in geographic area requested by pt/family on 04/26/17     FL2 transmitted to all facilities within larger geographic area on       Patient informed that his/her managed care company has contracts with or will negotiate with certain facilities, including the following:        Yes   Patient/family informed of bed offers received.  [facility] bed [facility]     Physician recommends and patient chooses bed at      Patient to be [facility] on 04/27/17.  Patient to be transferred to facility by [facility]     Patient family notified on 04/27/17 of transfer.  Name of family member notified:  Debroah Looprnold, spouse     PHYSICIAN       Additional Comment:     _______________________________________________ Maree KrabbeBridget A Marlyss Cissell, LCSW 04/27/2017, 4:17 PM

## 2017-04-27 NOTE — Progress Notes (Signed)
Called report to SNF. Will continue to monitor until [facility] arrives.

## 2017-04-29 LAB — FOLATE RBC
Folate, Hemolysate: 612.1 ng/mL
Folate, RBC: 1554 ng/mL (ref >498)
Hematocrit: 39.4 % (ref 34.0–46.6)

## 2017-05-25 ENCOUNTER — Emergency Department (HOSPITAL_COMMUNITY)
Admission: EM | Admit: 2017-05-25 | Discharge: 2017-05-25 | Disposition: A | Discharge status: Home / Self Care | Payer: Medicare Other | Attending: Emergency Medicine | Admitting: Emergency Medicine

## 2017-05-25 ENCOUNTER — Encounter (HOSPITAL_COMMUNITY): Payer: Self-pay | Admitting: Emergency Medicine

## 2017-05-25 DIAGNOSIS — R109 Unspecified abdominal pain: Secondary | ICD-10-CM | POA: Insufficient documentation

## 2017-05-25 DIAGNOSIS — Z5321 Procedure and treatment not carried out due to patient leaving prior to being seen by health care provider: Secondary | ICD-10-CM | POA: Diagnosis not present

## 2017-05-25 LAB — CBC
HCT: 35.4 % — ABNORMAL LOW (ref 36.0–46.0)
Hemoglobin: 11.3 g/dL — ABNORMAL LOW (ref 12.0–15.0)
MCH: 29.4 pg (ref 26.0–34.0)
MCHC: 31.9 g/dL (ref 30.0–36.0)
MCV: 91.9 fL (ref 78.0–100.0)
Platelets: 269 10*3/uL (ref 150–400)
RBC: 3.85 MIL/uL — ABNORMAL LOW (ref 3.87–5.11)
RDW: 15.7 % — AB (ref 11.5–15.5)
WBC: 9.2 10*3/uL (ref 4.0–10.5)

## 2017-05-25 LAB — COMPREHENSIVE METABOLIC PANEL
ALBUMIN: 3.6 g/dL (ref 3.5–5.0)
ALK PHOS: 102 U/L (ref 38–126)
ALT: 17 U/L (ref 14–54)
AST: 20 U/L (ref 15–41)
BUN: 17 mg/dL (ref 6–20)
CALCIUM: 9.2 mg/dL (ref 8.9–10.3)
CO2: 29 mmol/L (ref 22–32)
Chloride: 96 mmol/L — ABNORMAL LOW (ref 101–111)
Creatinine, Ser: 0.68 mg/dL (ref 0.44–1.00)
GFR calc Af Amer: >60 mL/min (ref >60)
GLUCOSE: 129 mg/dL — AB (ref 65–99)
POTASSIUM: 3.6 mmol/L (ref 3.5–5.1)
Sodium: 134 mmol/L — ABNORMAL LOW (ref 135–145)
Total Bilirubin: 0.5 mg/dL (ref 0.3–1.2)
Total Protein: 6.6 g/dL (ref 6.5–8.1)

## 2017-05-25 LAB — LIPASE, BLOOD: Lipase: 32 U/L (ref 11–51)

## 2017-05-25 NOTE — ED Notes (Signed)
Pt and husband upset due to wait, after yelling at staff and visitors they had care giver come take them home.

## 2017-05-25 NOTE — ED Triage Notes (Signed)
Pt c/o abd pains for 3-4 days. Has ribbon like stools. Denies vomiting. Pt reports that she has urinary frequency during the night with clear urine for last several nights. Reports that she recently got out of [facility] for rehab from stroke.

## 2017-05-30 ENCOUNTER — Other Ambulatory Visit: Payer: Self-pay

## 2017-05-30 ENCOUNTER — Encounter (HOSPITAL_COMMUNITY): Payer: Self-pay

## 2017-05-30 ENCOUNTER — Emergency Department (HOSPITAL_COMMUNITY)
Admission: EM | Admit: 2017-05-30 | Discharge: 2017-05-31 | Disposition: A | Discharge status: Home / Self Care | Payer: Medicare Other | Attending: Emergency Medicine | Admitting: Emergency Medicine

## 2017-05-30 DIAGNOSIS — Z79899 Other long term (current) drug therapy: Secondary | ICD-10-CM | POA: Diagnosis not present

## 2017-05-30 DIAGNOSIS — Z87891 Personal history of nicotine dependence: Secondary | ICD-10-CM | POA: Diagnosis not present

## 2017-05-30 DIAGNOSIS — R1084 Generalized abdominal pain: Secondary | ICD-10-CM | POA: Diagnosis present

## 2017-05-30 DIAGNOSIS — R197 Diarrhea, unspecified: Secondary | ICD-10-CM

## 2017-05-30 DIAGNOSIS — I10 Essential (primary) hypertension: Secondary | ICD-10-CM | POA: Diagnosis not present

## 2017-05-30 DIAGNOSIS — E876 Hypokalemia: Secondary | ICD-10-CM

## 2017-05-30 DIAGNOSIS — J449 Chronic obstructive pulmonary disease, unspecified: Secondary | ICD-10-CM | POA: Diagnosis not present

## 2017-05-30 DIAGNOSIS — R109 Unspecified abdominal pain: Secondary | ICD-10-CM

## 2017-05-30 LAB — COMPREHENSIVE METABOLIC PANEL
ALBUMIN: 3.7 g/dL (ref 3.5–5.0)
ALK PHOS: 75 U/L (ref 38–126)
ALT: 14 U/L (ref 14–54)
ANION GAP: 14 (ref 5–15)
AST: 17 U/L (ref 15–41)
BILIRUBIN TOTAL: 0.4 mg/dL (ref 0.3–1.2)
BUN: 16 mg/dL (ref 6–20)
CALCIUM: 9 mg/dL (ref 8.9–10.3)
CO2: 26 mmol/L (ref 22–32)
CREATININE: 0.77 mg/dL (ref 0.44–1.00)
Chloride: 94 mmol/L — ABNORMAL LOW (ref 101–111)
GFR calc non Af Amer: >60 mL/min (ref >60)
GLUCOSE: 96 mg/dL (ref 65–99)
Potassium: 2.9 mmol/L — ABNORMAL LOW (ref 3.5–5.1)
Sodium: 134 mmol/L — ABNORMAL LOW (ref 135–145)
TOTAL PROTEIN: 6.2 g/dL — AB (ref 6.5–8.1)

## 2017-05-30 LAB — CBC
HCT: 37.3 % (ref 36.0–46.0)
HEMOGLOBIN: 11.9 g/dL — AB (ref 12.0–15.0)
MCH: 29 pg (ref 26.0–34.0)
MCHC: 31.9 g/dL (ref 30.0–36.0)
MCV: 90.8 fL (ref 78.0–100.0)
PLATELETS: 351 10*3/uL (ref 150–400)
RBC: 4.11 MIL/uL (ref 3.87–5.11)
RDW: 15.6 % — AB (ref 11.5–15.5)
WBC: 7.4 10*3/uL (ref 4.0–10.5)

## 2017-05-30 LAB — URINALYSIS, ROUTINE W REFLEX MICROSCOPIC
BILIRUBIN URINE: NEGATIVE
Glucose, UA: NEGATIVE mg/dL
HGB URINE DIPSTICK: NEGATIVE
KETONES UR: 5 mg/dL — AB
Leukocytes, UA: NEGATIVE
NITRITE: NEGATIVE
PROTEIN: NEGATIVE mg/dL
SPECIFIC GRAVITY, URINE: 1.027 (ref 1.005–1.030)
pH: 5 (ref 5.0–8.0)

## 2017-05-30 LAB — LIPASE, BLOOD: Lipase: 27 U/L (ref 11–51)

## 2017-05-30 NOTE — ED Triage Notes (Signed)
Pt presents with 2 week h/o generalized abdominal pain, nausea, diarrhea.  Pt was at [facility] after a fall, was not evaluated while she was a pt there.

## 2017-05-30 NOTE — ED Notes (Signed)
Pt and husband have a ride back home, they just have to call.

## 2017-05-31 ENCOUNTER — Emergency Department (HOSPITAL_COMMUNITY): Payer: Medicare Other

## 2017-05-31 LAB — I-STAT CG4 LACTIC ACID, ED: Lactic Acid, Venous: 1.33 mmol/L (ref 0.5–1.9)

## 2017-05-31 MED ORDER — SODIUM CHLORIDE 0.9 % IV BOLUS
500.0000 mL | Freq: Once | INTRAVENOUS | Status: AC
  Administered 2017-05-31: 500 mL via INTRAVENOUS

## 2017-05-31 MED ORDER — POTASSIUM CHLORIDE CRYS ER 20 MEQ PO TBCR: 20.0000 meq | EXTENDED_RELEASE_TABLET | Freq: Two times a day (BID) | ORAL | 0 refills | Status: DC

## 2017-05-31 MED ORDER — POTASSIUM CHLORIDE CRYS ER 20 MEQ PO TBCR
40.0000 meq | EXTENDED_RELEASE_TABLET | Freq: Once | ORAL | Status: AC
  Administered 2017-05-31: 40 meq via ORAL
  Filled 2017-05-31: qty 2

## 2017-05-31 MED ORDER — IOPAMIDOL (ISOVUE-300) INJECTION 61%
INTRAVENOUS | Status: AC
  Filled 2017-05-31: qty 30

## 2017-05-31 MED ORDER — IOPAMIDOL (ISOVUE-300) INJECTION 61%
INTRAVENOUS | Status: AC
  Administered 2017-05-31: 100 mL
  Filled 2017-05-31: qty 100

## 2017-05-31 NOTE — ED Provider Notes (Signed)
[facility] EMERGENCY DEPARTMENT Provider Note   CSN: [id number] Arrival date & time: 05/30/17  1813     History   Chief Complaint Chief Complaint  Patient presents with  . Abdominal Pain    HPI Lindsey Norris is a 84 y.o. female.  HPI [age]-year-old female past medical history significant for hypertension, GERD, COPD presents to the emergency department today for evaluation of generalized abdominal pain, loose stools.  The patient states that she was admitted to the hospital on 2/27 after syncopal episode due to tramadol.  Patient was discharged to a nursing facility 3 days later then was discharged home.  Patient states for the past week she has been having generalized abdominal cramping that comes and goes.  Denies any at this time.  She also reports having several episodes of intermittent loose stool.  Patient states that it is watery or brown at times.  Patient denies any associated urinary symptoms.  Denies any associated bloody stools.  Patient has had no vomiting since 6 days ago.  Denies any fevers.  She denies any recent antibiotic use.  Denies any recent travel outside the [place].  Nothing makes her symptoms better or worse.  Denies any history of C. Difficile.  Pt denies any fever, chill, ha, vision changes, lightheadedness, dizziness, congestion, neck pain, cp, sob, cough, urinary symptoms, melena, hematochezia, lower extremity paresthesias.  Past Medical History:  Diagnosis Date  . Arthritis   . Chronic gastritis   . Colon polyp    dimnutive 2mm  . COPD (chronic obstructive pulmonary disease) (HCC)   . Duodenitis without hemorrhage   . GERD (gastroesophageal reflux disease)   . Hemorrhoids, internal    & external  . Hypercholesteremia   . Hypertension   . Osteoporosis 2013/2015/2017   2013 T score -2.5, 2015 T score - 2.4, 2017 T score -2.3 improved at both hips from prior study  . Sclerosis of the skin    LICHENS SCLEROSIS  . Urticaria      Patient Active Problem List   Diagnosis Date Noted  . Seizures (HCC) 04/25/2017  . Syncope 04/25/2017  . Altered mental status 04/25/2017  . Arthritis   . Seasonal and perennial allergic rhinitis 05/09/2010  . Hyperlipemia 06/11/2007  . Hypertension 06/11/2007  . HEMORRHOIDS 06/11/2007  . COPD, MILD 06/11/2007  . OSTEOARTHRITIS 06/11/2007  . Personal history of colonic polyps 01/15/2007  . Asthma, mild intermittent, well-controlled 01/15/2007  . GERD 01/15/2007  . GASTRITIS, CHRONIC 04/22/2001    Past Surgical History:  Procedure Laterality Date  . APPENDECTOMY    . CATARACT EXTRACTION     bilateral x 2  . COLONOSCOPY    . DILATION AND CURETTAGE OF UTERUS  1969  . ESOPHAGOGASTRODUODENOSCOPY    . NOSE SURGERY     DEVIATED SEPTUM REPAIR  . REFRACTIVE SURGERY     LASER EYE SURGERY X2/ 2010 AND 2011     OB History    Gravida  0   Para      Term      Preterm      AB      Living        SAB      TAB      Ectopic      Multiple      Live Births               Home Medications    Prior to Admission medications   Medication Sig Start Date  End Date Taking? Authorizing Provider  acetaminophen (TYLENOL) 325 MG tablet Take 2 tablets (650 mg total) by mouth every 6 (six) hours as needed for mild pain (or Fever >/= 101). 04/27/17   Glade Lloyd, MD  albuterol (PROAIR HFA) 108 (90 Base) MCG/ACT inhaler Inhale 2 puffs into the lungs every 4 (four) hours as needed for wheezing or shortness of breath. 05/28/16   Bobbitt, Heywood Iles, MD  azelastine (ASTELIN) 0.1 % nasal spray Place 1 spray into both nostrils 2 (two) times daily. Use in each nostril as directed 05/28/16   Bobbitt, Heywood Iles, MD  celecoxib (CELEBREX) 200 MG capsule Take 200 mg by mouth daily as needed. 07/19/15   [provider]  denosumab (PROLIA) 60 MG/ML SOLN injection Inject 60 mg into the skin every 6 (six) months. Administer in upper arm, thigh, or abdomen    [provider]   EPINEPHrine (EPIPEN 2-PAK) 0.3 mg/0.3 mL IJ SOAJ injection Inject into thigh if needed for severe allergic reaction Patient not taking: Reported on 09/04/2016 02/24/14   Waymon Budge, MD  Multiple Vitamin (MULTIVITAMIN) capsule Take 1 capsule by mouth daily.      [provider]  NEXIUM 40 MG capsule TAKE 1 CAPSULE EVERY DAY 30 MINUTES BEFORE A MEAL 01/22/12   Iva Boop, MD  triamterene-hydrochlorothiazide (DYAZIDE) 37.5-25 MG per capsule Take 1 capsule by mouth daily.     [provider]    Family History Family History  Problem Relation Age of Onset  . Hypertension Mother   . Asthma Mother   . Heart disease Father   . Uterine cancer Paternal Aunt   . Diabetes Maternal Grandfather   . Stomach cancer Paternal Grandfather   . Colon cancer Neg Hx   . Allergic rhinitis Neg Hx   . Angioedema Neg Hx   . Eczema Neg Hx   . Immunodeficiency Neg Hx   . Urticaria Neg Hx     Social History Social History   Tobacco Use  . Smoking status: Former Smoker    Packs/day: 0.10    Years: 4.00    Pack years: 0.40    Types: Cigarettes    Last attempt to quit: 02/27/1956    Years since quitting: 61.2  . Smokeless tobacco: Never Used  Substance Use Topics  . Alcohol use: No    Alcohol/week: 0.0 oz  . Drug use: No     Allergies   Sulfonamide derivatives   Review of Systems Review of Systems  All other systems reviewed and are negative.    Physical Exam Updated Vital Signs BP (!) 171/96 (BP Location: Left Arm)   Pulse 84   Temp 98.2 F (36.8 C) (Oral)   Resp 16   SpO2 100%   Physical Exam  Constitutional: She is oriented to person, place, and time. She appears well-developed.  Non-toxic appearance. No distress.  Chronically ill-appearing female that is thin and resting on the bed comfortably.  HENT:  Head: Normocephalic and atraumatic.  Nose: Nose normal.  Mouth/Throat: Oropharynx is clear and moist.  Mucous membranes are moist.  Eyes: Pupils are  equal, round, and reactive to light. Conjunctivae are normal. Right eye exhibits no discharge. Left eye exhibits no discharge.  Neck: Normal range of motion. Neck supple.  Cardiovascular: Normal rate, regular rhythm, normal heart sounds and intact distal pulses.  Pulmonary/Chest: Effort normal and breath sounds normal. No respiratory distress. She exhibits no tenderness.  Abdominal: Soft. Bowel sounds are increased. There is generalized  tenderness. There is no rigidity, no rebound, no guarding, no CVA tenderness, no tenderness at McBurney's point and negative Murphy's sign.  Musculoskeletal: Normal range of motion. She exhibits no tenderness.  Lymphadenopathy:    She has no cervical adenopathy.  Neurological: She is alert and oriented to person, place, and time.  Skin: Skin is warm and dry. Capillary refill takes less than 2 seconds.  Poor skin turgor.  Psychiatric: Her behavior is normal. Judgment and thought content normal.  Nursing note and vitals reviewed.    ED Treatments / Results  Labs (all labs ordered are listed, but only abnormal results are displayed) Labs Reviewed  COMPREHENSIVE METABOLIC PANEL - Abnormal; Notable for the following components:      Result Value   Sodium 134 (*)    Potassium 2.9 (*)    Chloride 94 (*)    Total Protein 6.2 (*)    All other components within normal limits  CBC - Abnormal; Notable for the following components:   Hemoglobin 11.9 (*)    RDW 15.6 (*)    All other components within normal limits  URINALYSIS, ROUTINE W REFLEX MICROSCOPIC - Abnormal; Notable for the following components:   Ketones, ur 5 (*)    All other components within normal limits  GASTROINTESTINAL PANEL BY PCR, STOOL (REPLACES STOOL CULTURE)  C DIFFICILE QUICK SCREEN W PCR REFLEX  LIPASE, BLOOD  I-STAT CG4 LACTIC ACID, ED  I-STAT CG4 LACTIC ACID, ED    EKG None  Radiology No results found.  Procedures Procedures (including critical care time)  Medications  Ordered in ED Medications  iopamidol (ISOVUE-300) 61 % injection (has no administration in time range)  iopamidol (ISOVUE-300) 61 % injection (has no administration in time range)  sodium chloride 0.9 % bolus 500 mL (500 mLs Intravenous New Bag/Given 05/31/17 0724)  potassium chloride SA (K-DUR,KLOR-CON) CR tablet 40 mEq (40 mEq Oral Given 05/31/17 0535)     Initial Impression / Assessment and Plan / ED Course  I have reviewed the triage vital signs and the nursing notes.  Pertinent labs & imaging results that were available during my care of the patient were reviewed by me and considered in my medical decision making (see chart for details).     Patient presents to the emergency department today with 1 week of generalized abdominal pain with intermittent loose stools.  Patient denies any associated fevers or recent vomiting.  She does report recent hospitalization and rehab stay 1 month ago.  On exam patient is overall well-appearing and nontoxic.  Vital signs are reassuring.  Patient afebrile in the ED.  No focal abdominal tenderness on palpation.  Patient has no CVA tenderness.  Heart regular rate and rhythm.  Lungs clear to auscultation bilaterally.  No signs of peritonitis.  Patient is lab work is reassuring.  No leukocytosis.  Hemoglobin appears at patient's baseline.  Normal lipase.  Her electrolytes show slight hypokalemia of 2.9 which was replaced with oral potassium in the ED.  Kidney function is normal.  Normal liver enzymes.  UA shows no signs of infection.  CT scan is pending at this time.  I also ordered a GI panel which is pending as patient has not been able to provide a stool sample while she is been in the ED.  Very low suspicion for C. difficile at this time.  She is nontoxic or septic appearing.  Normal lactic acid.  [facility]. Pt has pending at this time ct scan and  repeat assessment.  Disposition likely home pending lab and test results.  If patient not able  to provide a stool sample in the ED she will need to follow-up with her primary care doctor with a stool sample.  He can have low suspicion for C. difficile at this time as patient cannot provide any stool sample.  She will also be discharged home with a short course of oral potassium.  Care dicussed and plan agreed upon with oncoming PA. Pt updated on plan of care and is currently hemodynamically stable at this time with normal vs.    Pt seen and evaluated my attending is agreed with the above plan.   Final Clinical Impressions(s) / ED Diagnoses   Final diagnoses:  Diarrhea, unspecified type  Abdominal pain, unspecified abdominal location    ED Discharge Orders    None       Wallace Keller 05/31/17 1610    Glynn Octave, MD 06/01/17 1116

## 2017-05-31 NOTE — ED Provider Notes (Signed)
Patient signed out to me by K Leaphart PA-C at shift change.  She is an 84 year old female who presents with generalized abdominal pain, nausea, vomiting, diarrhea.  Lab work is remarkable for mild hyponatremia, hypokalemia, hypo-chloremia.  Her white count is normal and her lactic acid is normal.  Her urine is normal.  CT scan of the abdomen pelvis is pending at shift change.  She was given fluids and potassium.  9:00 AM CT is of poor quality since the patient was agitated while in the scanner.  There is a question of possible gallbladder distention and thickening.  Will reassess once the patient has calmer  9:51 AM On reassessment the patient is calm and sleeping. Her abdomen is soft but she yells "NO!" everywhere I touch, including her leg and chest. I doubt cholecystitis given her symptoms and labs. Discussed with Dr. Hyacinth MeekerMiller. Will d/c back to SNF.      Lindsey Norris, Lindsey Norris Marie, PA-C 05/31/17 [id number]    Eber HongMiller, Brian, MD 05/31/17 [phone]

## 2017-05-31 NOTE — Discharge Instructions (Addendum)
Please give potassium for the next week Have potassium rechecked by her primary doctor

## 2017-05-31 NOTE — ED Notes (Signed)
Notified pt's husband that she has been discharged and will need to be picked up.

## 2017-08-27 ENCOUNTER — Encounter: Payer: Self-pay | Admitting: *Deleted

## 2017-09-02 ENCOUNTER — Encounter: Payer: Self-pay | Admitting: Diagnostic Neuroimaging

## 2017-09-02 ENCOUNTER — Ambulatory Visit: Payer: Medicare Other | Admitting: Diagnostic Neuroimaging

## 2017-09-02 VITALS — BP 134/82 | HR 102 | Ht 60.0 in | Wt 96.6 lb

## 2017-09-02 DIAGNOSIS — F03B18 Unspecified dementia, moderate, with other behavioral disturbance: Secondary | ICD-10-CM

## 2017-09-02 DIAGNOSIS — F0391 Unspecified dementia with behavioral disturbance: Secondary | ICD-10-CM | POA: Diagnosis not present

## 2017-09-02 NOTE — Progress Notes (Signed)
[facility]  PATIENT: Stesha Neyens Hinze DOB: [birth year]  REFERRING CLINICIAN: Tisovec HISTORY FROM: patient and wife REASON FOR VISIT: new consult    HISTORICAL  CHIEF COMPLAINT:  Chief Complaint  Patient presents with  . NP Dr. Wylene Simmer  . Memory Loss    Pt very HOH, Here with husband, (drivers here in waiting area)    HISTORY OF PRESENT ILLNESS:   84 year old female here for evaluation of memory loss.  Patient notes some mild memory loss but does not seem that bothered by it.  According to husband patient has had at least 3 to 5 years of gradual onset progressive short-term memory loss, confusion, decline in ADLs.  She is extremely hard of hearing.  She uses a walker.  He has done most of the household chores for many years in their marriage.  He take care of cooking, finances, shopping.  They and both no longer drive.  They have a driver and they made that help them.  They have neighbors and friends who helped him as well.  Patient was admitted to hospital in February 2019 for passing out vs seizure.  Patient was noted to have confusion, sundowning, and suspected dementia at that time.    REVIEW OF SYSTEMS: Full 14 system review of systems performed and negative with exception of: As per HPI.  ALLERGIES: Allergies  Allergen Reactions  . Sulfonamide Derivatives     REACTION: vomiting    HOME MEDICATIONS: Outpatient Medications Prior to Visit  Medication Sig Dispense Refill  . acetaminophen (TYLENOL) 325 MG tablet Take 2 tablets (650 mg total) by mouth every 6 (six) hours as needed for mild pain (or Fever >/= 101). 30 tablet 0  . azelastine (ASTELIN) 0.1 % nasal spray Place 1 spray into both nostrils 2 (two) times daily. Use in each nostril as directed (Patient taking differently: Place 1 spray into both nostrils as needed. Use in each nostril as directed) 30 mL 3  . Multiple Vitamin (MULTIVITAMIN) capsule Take 1 capsule by mouth daily.      [name] NEXIUM  40 MG capsule TAKE 1 CAPSULE EVERY DAY 30 MINUTES BEFORE A MEAL 30 capsule 8  . traMADol (ULTRAM) 50 MG tablet Take 50 mg by mouth every 4 (four) hours as needed.    . triamterene-hydrochlorothiazide (DYAZIDE) 37.5-25 MG per capsule Take 1 capsule by mouth daily.     [name] EPINEPHrine (EPIPEN 2-PAK) 0.3 mg/0.3 mL IJ SOAJ injection Inject into thigh if needed for severe allergic reaction (Patient not taking: Reported on 09/04/2016) 1 Device PRN  . albuterol (PROAIR HFA) 108 (90 Base) MCG/ACT inhaler Inhale 2 puffs into the lungs every 4 (four) hours as needed for wheezing or shortness of breath. 1 Inhaler 1  . celecoxib (CELEBREX) 200 MG capsule Take 200 mg by mouth daily as needed.  3  . denosumab (PROLIA) 60 MG/ML SOLN injection Inject 60 mg into the skin every 6 (six) months. Administer in upper arm, thigh, or abdomen    . potassium chloride SA (K-DUR,KLOR-CON) 20 MEQ tablet Take 1 tablet (20 mEq total) by mouth 2 (two) times daily. (Patient not taking: Reported on 09/02/2017) 14 tablet 0   No facility-administered medications prior to visit.     PAST MEDICAL HISTORY: Past Medical History:  Diagnosis Date  . Arthritis   . Chronic gastritis   . Colon polyp    dimnutive 2mm  . COPD (chronic obstructive pulmonary disease) (HCC)   . Duodenitis without hemorrhage   .  GERD (gastroesophageal reflux disease)   . Hemorrhoids, internal    & external  . Hypercholesteremia   . Hypertension   . Osteoporosis 2013/2015/2017   2013 T score -2.5, 2015 T score - 2.4, 2017 T score -2.3 improved at both hips from prior study  . Sclerosis of the skin    LICHENS SCLEROSIS  . Urticaria     PAST SURGICAL HISTORY: Past Surgical History:  Procedure Laterality Date  . APPENDECTOMY    . CATARACT EXTRACTION     bilateral x 2  . COLONOSCOPY    . DILATION AND CURETTAGE OF UTERUS  1969  . ESOPHAGOGASTRODUODENOSCOPY    . NOSE SURGERY     DEVIATED SEPTUM REPAIR  . REFRACTIVE SURGERY     LASER EYE SURGERY X2/  2010 AND 2011    FAMILY HISTORY: Family History  Problem Relation Age of Onset  . Hypertension Mother   . Asthma Mother   . Stroke Mother   . Heart disease Father   . Heart attack Father   . Uterine cancer Paternal Aunt   . Diabetes Maternal Grandfather   . Stomach cancer Paternal Grandfather   . Colon cancer Neg Hx   . Allergic rhinitis Neg Hx   . Angioedema Neg Hx   . Eczema Neg Hx   . Immunodeficiency Neg Hx   . Urticaria Neg Hx     SOCIAL HISTORY:  Social History   Socioeconomic History  . Marital status: Married    Spouse name: Not on file  . Number of children: 0  . Years of education: Not on file  . Highest education level: Not on file  Occupational History    Employer: RETIRED    Comment: RN, [employer] administrator nursing school  Social Needs  . Financial resource strain: Not on file  . Food insecurity:    Worry: Not on file    Inability: Not on file  . Transportation needs:    Medical: Not on file    Non-medical: Not on file  Tobacco Use  . Smoking status: Former Smoker    Packs/day: 0.10    Years: 4.00    Pack years: 0.40    Types: Cigarettes    Last attempt to quit: 02/27/1956    Years since quitting: 61.5  . Smokeless tobacco: Never Used  Substance and Sexual Activity  . Alcohol use: No    Alcohol/week: 0.0 oz  . Drug use: No  . Sexual activity: Never    Birth control/protection: Post-menopausal    Comment: 1st intercourse 39yo-1 partner  Lifestyle  . Physical activity:    Days per week: Not on file    Minutes per session: Not on file  . Stress: Not on file  Relationships  . Social connections:    Talks on phone: Not on file    Gets together: Not on file    Attends religious service: Not on file    Active member of club or organization: Not on file    Attends meetings of clubs or organizations: Not on file    Relationship status: Not on file  . Intimate partner violence:    Fear of current or ex partner: Not on file    Emotionally abused: Not on file     Physically abused: Not on file    Forced sexual activity: Not on file  Other Topics Concern  . Not on file  Social History Narrative   Caffeine- 2 daily     PHYSICAL EXAM  GENERAL  EXAM/CONSTITUTIONAL: Vitals:  Vitals:   09/02/17 1009  BP: 134/82  Pulse: (!) 102  Weight: 96 lb 9.6 oz (43.8 kg)  Height: 5' (1.524 m)     Body mass index is 18.87 kg/m.  No exam data present  FRAIL APPEARING; well developed, nourished and groomed; neck is supple  CARDIOVASCULAR:  Examination of carotid arteries is normal; no carotid bruits  Regular rate and rhythm, no murmurs  Examination of peripheral vascular system by observation and palpation is normal  EYES:  Ophthalmoscopic exam of optic discs and posterior segments is normal; no papilledema or hemorrhages  MUSCULOSKELETAL:  Gait, strength, tone, movements noted in Neurologic exam below  NEUROLOGIC: MENTAL STATUS:  MMSE - Mini Mental State Exam 09/02/2017  Orientation to time 4  Orientation to Place 4  Registration 1  Attention/ Calculation 0  Recall 0  Language- name 2 objects 2  Language- repeat 1  Language- follow 3 step command 3  Language- read & follow direction 1  Write a sentence 1  Copy design 0  Total score 17    awake, alert, oriented to person, place and time; EXCEPT DATE AND BUILDING  DECR recent memory  DECR attention and concentration  language fluent, comprehension intact, naming intact  fund of knowledge LIMITED  MILD MOTOR APRAXIA  CRANIAL NERVE:   2nd - no papilledema on fundoscopic exam  2nd, 3rd, 4th, 6th - pupils equal and reactive to light, visual fields full to confrontation, extraocular muscles intact, no nystagmus  5th - facial sensation symmetric  7th - facial strength symmetric  8th - hearing --> REDUCED   9th - palate elevates symmetrically, uvula midline  11th - shoulder shrug symmetric  12th - tongue protrusion midline  MOTOR:   normal bulk and tone,  DIFFUSE 4/5 strength in the BUE, BLE  SENSORY:   normal and symmetric to light touch; DECR VIB IN FEET AND KNEES  COORDINATION:   finger-nose-finger, fine finger movements SLOW  REFLEXES:   deep tendon reflexes TRACE and symmetric  GAIT/STATION:   UNSTEADY GAIT; SHORT STEPS; USES WALKER    DIAGNOSTIC DATA (LABS, IMAGING, TESTING) - I reviewed patient records, labs, notes, testing and imaging myself where available.  Lab Results  Component Value Date   WBC 7.4 05/30/2017   HGB 11.9 (L) 05/30/2017   HCT 37.3 05/30/2017   MCV 90.8 05/30/2017   PLT 351 05/30/2017      Component Value Date/Time   NA 134 (L) 05/30/2017 1938   K 2.9 (L) 05/30/2017 1938   CL 94 (L) 05/30/2017 1938   CO2 26 05/30/2017 1938   GLUCOSE 96 05/30/2017 1938   BUN 16 05/30/2017 1938   CREATININE 0.77 05/30/2017 1938   CALCIUM 9.0 05/30/2017 1938   CALCIUM 9.3 12/07/2011 1601   PROT 6.2 (L) 05/30/2017 1938   ALBUMIN 3.7 05/30/2017 1938   AST 17 05/30/2017 1938   ALT 14 05/30/2017 1938   ALKPHOS 75 05/30/2017 1938   BILITOT 0.4 05/30/2017 1938   GFRNONAA >60 05/30/2017 1938   GFRAA >60 05/30/2017 1938   No results found for: CHOL, HDL, LDLCALC, LDLDIRECT, TRIG, CHOLHDL No results found for: [id number] Lab Results  Component Value Date   VITAMINB12 559 04/27/2017   Lab Results  Component Value Date   TSH 2.180 04/27/2017    04/26/17 MRI brain [I reviewed images myself and agree with interpretation. -VRP]  1. No acute or focal lesion to explain the patient's altered mental status or recent changes. 2. Moderate  atrophy and white matter disease likely reflects the sequela of chronic microvascular ischemia. 3. 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    ASSESSMENT AND PLAN  84 y.o. year old female here with gradual onset progressive short-term memory loss, confusion, decline in ADLs, since 2015.  Signs and symptoms most consistent with  moderate dementia.  Dx:  1. Moderate dementia with behavioral disturbance      PLAN:  - safety / supervision issues reviewed - advanced care planning reviewed - caregiver resources provided - caution with gait and balance - could consider memantine, but likely of limited benefit given patient's functional status and age  Return if symptoms worsen or fail to improve, for return to PCP.    Suanne Marker, MD 09/02/2017, 10:32 AM Certified in Neurology, Neurophysiology and Neuroimaging  [facility] [address] [place], [place] [address] [phone]

## 2017-09-02 NOTE — Patient Instructions (Signed)
-   safety / supervision issues reviewed - advanced care planning reviewed - caregiver resources provided - caution with gait and balance

## 2018-09-30 ENCOUNTER — Ambulatory Visit (INDEPENDENT_AMBULATORY_CARE_PROVIDER_SITE_OTHER): Payer: Medicare Other | Admitting: Diagnostic Neuroimaging

## 2018-09-30 ENCOUNTER — Encounter: Payer: Self-pay | Admitting: Diagnostic Neuroimaging

## 2018-09-30 ENCOUNTER — Other Ambulatory Visit: Payer: Self-pay

## 2018-09-30 VITALS — BP 136/83 | HR 83 | Temp 95.0°F

## 2018-09-30 DIAGNOSIS — F0391 Unspecified dementia with behavioral disturbance: Secondary | ICD-10-CM | POA: Diagnosis not present

## 2018-09-30 DIAGNOSIS — F03B18 Unspecified dementia, moderate, with other behavioral disturbance: Secondary | ICD-10-CM

## 2018-09-30 MED ORDER — SERTRALINE HCL 25 MG PO TABS: 25.0000 mg | ORAL_TABLET | Freq: Every day | ORAL | 6 refills | Status: DC

## 2018-09-30 NOTE — Progress Notes (Signed)
[facility]  PATIENT: Lindsey Norris DOB: Apr 02, 1926  REFERRING CLINICIAN: Tisovec HISTORY FROM: patient and husband and housekeep / friend REASON FOR VISIT: follow up   HISTORICAL  CHIEF COMPLAINT:  Chief Complaint  Patient presents with  . Dementia    rm 6, 1 year FU, husband- Jeanella Antonrmand, house keeper- Olegario MessierKathy, Unable to complete MMSE    HISTORY OF PRESENT ILLNESS:   UPDATE (09/30/18, VRP): Since last visit, doing poorly. Symptoms are progressive.  Patient has limited support from husband and a housekeeper who comes in 2 days a week.  Patient's son has try to arrange outside help from [facility], but family has not been happy with them and declined additional help.  Patient is more agitated with mood swings.  She has poor insight.  Nutrition is limited.  Patient is losing weight.  Patient is very hard of hearing but does not have access to hearing aids because husband is afraid the patient will lose them.  PRIOR HPI (09/02/17): 85 year old female here for evaluation of memory loss.  Patient notes some mild memory loss but does not seem that bothered by it.  According to husband patient has had at least 3 to 5 years of gradual onset progressive short-term memory loss, confusion, decline in ADLs.  She is extremely hard of hearing.  She uses a walker.  He has done most of the household chores for many years in their marriage.  He take care of cooking, finances, shopping.  They and both no longer drive.  They have a driver and they made that help them.  They have neighbors and friends who helped him as well.  Patient was admitted to hospital in February 2019 for passing out vs seizure.  Patient was noted to have confusion, sundowning, and suspected dementia at that time.    REVIEW OF SYSTEMS: Full 14 system review of systems performed and negative with exception of: as per HPI.  ALLERGIES: Allergies  Allergen Reactions  . Sulfonamide Derivatives    REACTION: vomiting    HOME MEDICATIONS: Outpatient Medications Prior to Visit  Medication Sig Dispense Refill  . acetaminophen (TYLENOL) 325 MG tablet Take 2 tablets (650 mg total) by mouth every 6 (six) hours as needed for mild pain (or Fever >/= 101). 30 tablet 0  . aspirin EC 81 MG tablet Take 81 mg by mouth daily.    . Multiple Vitamin (MULTIVITAMIN) capsule Take 1 capsule by mouth daily.      [name]. omeprazole (PRILOSEC) 10 MG capsule Take 10 mg by mouth daily. As needed    . traMADol (ULTRAM) 50 MG tablet Take 50 mg by mouth every 4 (four) hours as needed.    . triamterene-hydrochlorothiazide (DYAZIDE) 37.5-25 MG per capsule Take 1 capsule by mouth daily.     [name]. azelastine (ASTELIN) 0.1 % nasal spray Place 1 spray into both nostrils 2 (two) times daily. Use in each nostril as directed (Patient not taking: Reported on 09/30/2018) 30 mL 3  . EPINEPHrine (EPIPEN 2-PAK) 0.3 mg/0.3 mL IJ SOAJ injection Inject into thigh if needed for severe allergic reaction (Patient not taking: Reported on 09/04/2016) 1 Device PRN  . NEXIUM 40 MG capsule TAKE 1 CAPSULE EVERY DAY 30 MINUTES BEFORE A MEAL (Patient not taking: Reported on 09/30/2018) 30 capsule 8   No facility-administered medications prior to visit.     PAST MEDICAL HISTORY: Past Medical History:  Diagnosis Date  . Arthritis   . Chronic gastritis   . Colon  polyp    dimnutive 25mm  . COPD (chronic obstructive pulmonary disease) ([place])   . Duodenitis without hemorrhage   . GERD (gastroesophageal reflux disease)   . Hearing loss   . Hemorrhoids, internal    & external  . Hypercholesteremia   . Hypertension   . Osteoporosis 2013/2015/2017   2013 T score -2.5, 2015 T score - 2.4, 2017 T score -2.3 improved at both hips from prior study  . Sclerosis of the skin    LICHENS SCLEROSIS  . Urticaria     PAST SURGICAL HISTORY: Past Surgical History:  Procedure Laterality Date  . APPENDECTOMY    . CATARACT EXTRACTION     bilateral x 2  .  COLONOSCOPY    . DILATION AND CURETTAGE OF UTERUS  1969  . ESOPHAGOGASTRODUODENOSCOPY    . NOSE SURGERY     DEVIATED SEPTUM REPAIR  . REFRACTIVE SURGERY     LASER EYE SURGERY X2/ 2010 AND 2011    FAMILY HISTORY: Family History  Problem Relation Age of Onset  . Hypertension Mother   . Asthma Mother   . Stroke Mother   . Heart disease Father   . Heart attack Father   . Uterine cancer Paternal Aunt   . Diabetes Maternal Grandfather   . Stomach cancer Paternal Grandfather   . Colon cancer Neg Hx   . Allergic rhinitis Neg Hx   . Angioedema Neg Hx   . Eczema Neg Hx   . Immunodeficiency Neg Hx   . Urticaria Neg Hx     SOCIAL HISTORY:  Social History   Socioeconomic History  . Marital status: Married    Spouse name: Not on file  . Number of children: 0  . Years of education: Not on file  . 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 Financial resource strain: Not on file  . Food insecurity    Worry: Not on file    Inability: Not on file  . Transportation needs    Medical: Not on file    Non-medical: Not on file  Tobacco Use  . Smoking status: Former Smoker    Packs/day: 0.10    Years: 4.00    Pack years: 0.40    Types: Cigarettes    Quit date: 02/27/1956    Years since quitting: 62.6  . Smokeless tobacco: Never Used  Substance and Sexual Activity  . Alcohol use: No    Alcohol/week: 0.0 standard drinks  . Drug use: No  . Sexual activity: Never    Birth control/protection: Post-menopausal    Comment: 1st intercourse 34yo-1 partner  Lifestyle  . Physical activity    Days per week: Not on file    Minutes per session: Not on file  . Stress: Not on file  Relationships  . Social Herbalist on phone: Not on file    Gets together: Not on file    Attends religious service: Not on file    Active member of club or organization: Not on file    Attends  meetings of clubs or organizations: Not on file    Relationship status: Not on file  . Intimate partner violence    Fear of current or ex partner: Not on file    Emotionally abused: Not on file    Physically abused: Not on file    Forced sexual activity: Not  on file  Other Topics Concern  . Not on file  Social History Narrative   Caffeine- 2 daily.  One stepson in [place], [place]  Education:  12 credits to doctorate.   Retired.  Lives with husband.       PHYSICAL EXAM  GENERAL EXAM/CONSTITUTIONAL: Vitals:  Vitals:   09/30/18 1007  BP: 136/83  Pulse: 83  Temp: (!) 95 F (35 C)   There is no height or weight on file to calculate BMI. No exam data present  FRAIL APPEARING; well developed, nourished and groomed; neck is supple  MUSCULOSKELETAL:  Gait, strength, tone, movements noted in Neurologic exam below  NEUROLOGIC: MENTAL STATUS:  MMSE - Mini Mental State Exam 09/30/2018 09/02/2017  Not completed: Unable to complete -  Orientation to time 3 4  Orientation to Place 2 4  Registration 1 1  Attention/ Calculation - 0  Recall - 0  Language- name 2 objects - 2  Language- repeat - 1  Language- follow 3 step command - 3  Language- read & follow direction - 1  Write a sentence - 1  Copy design - 0  Total score - 17    awake, alert, oriented to person  DECR recent memory  DECR attention and concentration  language fluent, comprehension intact, naming intact  fund of knowledge LIMITED  MILD MOTOR APRAXIA  POOR INSIGHT  AGITATED  MOTOR:   DIFFUSE 4/5 strength in the BUE, BLE  COORDINATION:   finger-nose-finger, fine finger movements SLOW  GAIT/STATION:   In wheelchair    DIAGNOSTIC DATA (LABS, IMAGING, TESTING) - I reviewed patient records, labs, notes, testing and imaging myself where available.  Lab Results  Component Value Date   WBC 7.4 05/30/2017   HGB 11.9 (L) 05/30/2017   HCT 37.3 05/30/2017   MCV 90.8 05/30/2017   PLT 351 05/30/2017       Component Value Date/Time   NA 134 (L) 05/30/2017 1938   K 2.9 (L) 05/30/2017 1938   CL 94 (L) 05/30/2017 1938   CO2 26 05/30/2017 1938   GLUCOSE 96 05/30/2017 1938   BUN 16 05/30/2017 1938   CREATININE 0.77 05/30/2017 1938   CALCIUM 9.0 05/30/2017 1938   CALCIUM 9.3 12/07/2011 1601   PROT 6.2 (L) 05/30/2017 1938   ALBUMIN 3.7 05/30/2017 1938   AST 17 05/30/2017 1938   ALT 14 05/30/2017 1938   ALKPHOS 75 05/30/2017 1938   BILITOT 0.4 05/30/2017 1938   GFRNONAA >60 05/30/2017 1938   GFRAA >60 05/30/2017 1938   No results found for: CHOL, HDL, LDLCALC, LDLDIRECT, TRIG, CHOLHDL No results found for: [id number] Lab Results  Component Value Date   VITAMINB12 559 04/27/2017   Lab Results  Component Value Date   TSH 2.180 04/27/2017    04/26/17 MRI brain [I reviewed images myself and agree with interpretation. -VRP]  1. No acute or focal lesion to explain the patient's altered mental status or recent changes. 2. Moderate atrophy and white matter disease likely reflects the sequela of chronic microvascular ischemia. 3. 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    ASSESSMENT AND PLAN  85 y.o. year old female here with gradual onset progressive short-term memory loss, confusion, decline in ADLs, since 2015.  Signs and symptoms most consistent with moderate dementia.  Dx:  1. Moderate dementia with behavioral disturbance (HCC)      PLAN:  MODERATE DEMENTIA WITH BEHAVIOR  - start sertraline 25mg  daily for mood  stabilization - recommend palliative care consult; discuss with PCP - safety / supervision issues reviewed - advanced care planning reviewed - caregiver resources provided - caution with gait and balance  Meds ordered this encounter  Medications  . sertraline (ZOLOFT) 25 MG tablet    Sig: Take 1 tablet (25 mg total) by mouth daily.    Dispense:  30 tablet    Refill:  6   Return for return to PCP.    Suanne MarkerVIKRAM  R. Viana Sleep, MD 09/30/2018, 10:31 AM Certified in Neurology, Neurophysiology and Neuroimaging  [facility] [address], [address] [place], [place] [id number] [phone]

## 2018-10-23 ENCOUNTER — Other Ambulatory Visit: Payer: Self-pay | Admitting: Diagnostic Neuroimaging

## 2018-10-23 NOTE — Telephone Encounter (Signed)
Refill changed to reflect request for 90 day supply, one additional refill given as original Rx was x 7 months.

## 2018-10-26 ENCOUNTER — Other Ambulatory Visit: Payer: Self-pay

## 2018-10-26 ENCOUNTER — Emergency Department (HOSPITAL_COMMUNITY): Payer: Medicare Other

## 2018-10-26 ENCOUNTER — Inpatient Hospital Stay (HOSPITAL_COMMUNITY)
Admission: EM | Admit: 2018-10-26 | Discharge: 2018-10-31 | DRG: 690 | Disposition: A | Discharge status: Home Health | Payer: Medicare Other | Attending: Internal Medicine | Admitting: Internal Medicine

## 2018-10-26 DIAGNOSIS — E860 Dehydration: Secondary | ICD-10-CM | POA: Diagnosis present

## 2018-10-26 DIAGNOSIS — Z79899 Other long term (current) drug therapy: Secondary | ICD-10-CM

## 2018-10-26 DIAGNOSIS — Z882 Allergy status to sulfonamides status: Secondary | ICD-10-CM

## 2018-10-26 DIAGNOSIS — N390 Urinary tract infection, site not specified: Secondary | ICD-10-CM | POA: Diagnosis not present

## 2018-10-26 DIAGNOSIS — Z8049 Family history of malignant neoplasm of other genital organs: Secondary | ICD-10-CM

## 2018-10-26 DIAGNOSIS — E876 Hypokalemia: Secondary | ICD-10-CM | POA: Diagnosis present

## 2018-10-26 DIAGNOSIS — E785 Hyperlipidemia, unspecified: Secondary | ICD-10-CM | POA: Diagnosis present

## 2018-10-26 DIAGNOSIS — Z825 Family history of asthma and other chronic lower respiratory diseases: Secondary | ICD-10-CM

## 2018-10-26 DIAGNOSIS — Z20828 Contact with and (suspected) exposure to other viral communicable diseases: Secondary | ICD-10-CM | POA: Diagnosis present

## 2018-10-26 DIAGNOSIS — I10 Essential (primary) hypertension: Secondary | ICD-10-CM | POA: Diagnosis present

## 2018-10-26 DIAGNOSIS — E7800 Pure hypercholesterolemia, unspecified: Secondary | ICD-10-CM | POA: Diagnosis present

## 2018-10-26 DIAGNOSIS — Z800 Family history of malignant neoplasm of digestive organs: Secondary | ICD-10-CM

## 2018-10-26 DIAGNOSIS — Z7982 Long term (current) use of aspirin: Secondary | ICD-10-CM

## 2018-10-26 DIAGNOSIS — Z8249 Family history of ischemic heart disease and other diseases of the circulatory system: Secondary | ICD-10-CM

## 2018-10-26 DIAGNOSIS — R531 Weakness: Secondary | ICD-10-CM

## 2018-10-26 DIAGNOSIS — Z9849 Cataract extraction status, unspecified eye: Secondary | ICD-10-CM

## 2018-10-26 DIAGNOSIS — H9190 Unspecified hearing loss, unspecified ear: Secondary | ICD-10-CM | POA: Diagnosis present

## 2018-10-26 DIAGNOSIS — E871 Hypo-osmolality and hyponatremia: Secondary | ICD-10-CM | POA: Diagnosis present

## 2018-10-26 DIAGNOSIS — M1990 Unspecified osteoarthritis, unspecified site: Secondary | ICD-10-CM | POA: Diagnosis present

## 2018-10-26 DIAGNOSIS — K219 Gastro-esophageal reflux disease without esophagitis: Secondary | ICD-10-CM | POA: Diagnosis present

## 2018-10-26 DIAGNOSIS — Z87891 Personal history of nicotine dependence: Secondary | ICD-10-CM

## 2018-10-26 DIAGNOSIS — F0390 Unspecified dementia without behavioral disturbance: Secondary | ICD-10-CM | POA: Diagnosis present

## 2018-10-26 DIAGNOSIS — M810 Age-related osteoporosis without current pathological fracture: Secondary | ICD-10-CM | POA: Diagnosis present

## 2018-10-26 DIAGNOSIS — J449 Chronic obstructive pulmonary disease, unspecified: Secondary | ICD-10-CM | POA: Diagnosis present

## 2018-10-26 LAB — COMPREHENSIVE METABOLIC PANEL
ALT: 14 U/L (ref 0–44)
AST: 18 U/L (ref 15–41)
Albumin: 3.9 g/dL (ref 3.5–5.0)
Alkaline Phosphatase: 68 U/L (ref 38–126)
Anion gap: 12 (ref 5–15)
BUN: 16 mg/dL (ref 8–23)
CO2: 26 mmol/L (ref 22–32)
Calcium: 9.3 mg/dL (ref 8.9–10.3)
Chloride: 90 mmol/L — ABNORMAL LOW (ref 98–111)
Creatinine, Ser: 0.87 mg/dL (ref 0.44–1.00)
GFR calc Af Amer: >60 mL/min (ref >60)
GFR calc non Af Amer: 59 mL/min — ABNORMAL LOW (ref >60)
Glucose, Bld: 105 mg/dL — ABNORMAL HIGH (ref 70–99)
Potassium: 3.2 mmol/L — ABNORMAL LOW (ref 3.5–5.1)
Sodium: 128 mmol/L — ABNORMAL LOW (ref 135–145)
Total Bilirubin: 0.5 mg/dL (ref 0.3–1.2)
Total Protein: 6.9 g/dL (ref 6.5–8.1)

## 2018-10-26 LAB — CBC WITH DIFFERENTIAL/PLATELET
Abs Immature Granulocytes: 0.09 10*3/uL — ABNORMAL HIGH (ref 0.00–0.07)
Basophils Absolute: 0.1 10*3/uL (ref 0.0–0.1)
Basophils Relative: 1 %
Eosinophils Absolute: 0.1 10*3/uL (ref 0.0–0.5)
Eosinophils Relative: 1 %
HCT: 41.6 % (ref 36.0–46.0)
Hemoglobin: 13.4 g/dL (ref 12.0–15.0)
Immature Granulocytes: 1 %
Lymphocytes Relative: 16 %
Lymphs Abs: 1.6 10*3/uL (ref 0.7–4.0)
MCH: 29.1 pg (ref 26.0–34.0)
MCHC: 32.2 g/dL (ref 30.0–36.0)
MCV: 90.4 fL (ref 80.0–100.0)
Monocytes Absolute: 0.9 10*3/uL (ref 0.1–1.0)
Monocytes Relative: 9 %
Neutro Abs: 7.5 10*3/uL (ref 1.7–7.7)
Neutrophils Relative %: 72 %
Platelets: 269 10*3/uL (ref 150–400)
RBC: 4.6 MIL/uL (ref 3.87–5.11)
RDW: 15.2 % (ref 11.5–15.5)
WBC: 10.4 10*3/uL (ref 4.0–10.5)
nRBC: 0 % (ref 0.0–0.2)

## 2018-10-26 LAB — URINALYSIS, ROUTINE W REFLEX MICROSCOPIC
Bilirubin Urine: NEGATIVE
Glucose, UA: NEGATIVE mg/dL
Hgb urine dipstick: NEGATIVE
Ketones, ur: NEGATIVE mg/dL
Nitrite: NEGATIVE
Protein, ur: NEGATIVE mg/dL
Specific Gravity, Urine: 1.006 (ref 1.005–1.030)
WBC, UA: >50 WBC/hpf — ABNORMAL HIGH (ref 0–5)
pH: 8 (ref 5.0–8.0)

## 2018-10-26 LAB — BRAIN NATRIURETIC PEPTIDE: B Natriuretic Peptide: 66.9 pg/mL (ref 0.0–100.0)

## 2018-10-26 MED ORDER — SODIUM CHLORIDE 0.9 % IV SOLN
1.0000 g | Freq: Once | INTRAVENOUS | Status: AC
  Administered 2018-10-26: 23:00:00 1 g via INTRAVENOUS
  Filled 2018-10-26: qty 10

## 2018-10-26 MED ORDER — SODIUM CHLORIDE 0.9 % IV SOLN
INTRAVENOUS | Status: DC
  Administered 2018-10-27 – 2018-10-30 (×6): via INTRAVENOUS

## 2018-10-26 NOTE — ED Triage Notes (Addendum)
Pt BIB [facility] from home. Pt has hx of dementia and is HOH and has a white board at bedside to assist in communication. EMS was called to the home d/t pt having difficulty ambulating. Pt usually walks with a walker, however has not been able to get up today and take any steps. Husband is also concerned with urinary frequency, however, was recently started on a diuretic. Pt A&O x1.  Pt had to be picked up from the wheelchair and placed in the bed by EMS and nursing staff. Pt was unable to support herself.

## 2018-10-26 NOTE — ED Notes (Signed)
Pt husband called back and was requesting update. Update was give and assured husband write of this note will call back once an update was given.

## 2018-10-26 NOTE — ED Provider Notes (Signed)
Care assumed from Dr. Adalberto Coleob [place]wood.  Please see her full H&P.  In short,  Lindsey Norris is a 85 y.o. female presents for generalized weakness.  Patient has a history of dementia and reports no concerns.  Concerns also for urinary frequency.   Physical Exam  BP 117/68   Pulse 70   Temp 98.5 F (36.9 C) (Oral)   Resp 16   SpO2 95%   Physical Exam Vitals signs and nursing note reviewed.  Constitutional:      Appearance: She is well-developed.     Comments: Frail-appearing  HENT:     Head: Normocephalic.  Eyes:     General: No scleral icterus.    Conjunctiva/sclera: Conjunctivae normal.  Neck:     Musculoskeletal: Normal range of motion.  Cardiovascular:     Rate and Rhythm: Normal rate.  Pulmonary:     Effort: Pulmonary effort is normal.  Skin:    General: Skin is warm and dry.  Neurological:     Mental Status: She is alert.     ED Course/Procedures   Clinical Course as of Oct 25 2340  Wynelle LinkSun Oct 26, 2018  2327 Discussed with Husband, Mr. Adonis HugueninDimeo who reports that patient is having some weakness and difficulty walking over the last few weeks but worsening in the last several days.  He reports she has not eaten today and is generally a poor eater.  Pt did not fall, but EMS was called because she was too weak to get off the toilet this afternoon.     [HM]  2336 RN reports patient unable to ambulate here in the emergency department and had to be lifted from wheelchair to bed as she was too weak to stand and pivot.   [HM]  2336 Hyponatremia  Sodium(!): 128 [HM]  2337 Hypochloremia  Chloride(!): 90 [HM]  2337 Hypokalemia  Potassium(!): 3.2 [HM]  2337 UTI -Rocephin given  Leukocytes,Ua(!): LARGE [HM]  2341 Questionable left basilar airspace opacity concerning for possible pneumonia -no hypoxia or tachypnea.  DG Chest Port 1 View [HM]    Clinical Course User Index [HM] Veera Stapleton, Boyd KerbsHannah, PA-C   Results for orders placed or performed during the hospital encounter of  10/26/18  Comprehensive metabolic panel  Result Value Ref Range   Sodium 128 (L) 135 - 145 mmol/L   Potassium 3.2 (L) 3.5 - 5.1 mmol/L   Chloride 90 (L) 98 - 111 mmol/L   CO2 26 22 - 32 mmol/L   Glucose, Bld 105 (H) 70 - 99 mg/dL   BUN 16 8 - 23 mg/dL   Creatinine, Ser 1.610.87 0.44 - 1.00 mg/dL   Calcium 9.3 8.9 - 09.610.3 mg/dL   Total Protein 6.9 6.5 - 8.1 g/dL   Albumin 3.9 3.5 - 5.0 g/dL   AST 18 15 - 41 U/L   ALT 14 0 - 44 U/L   Alkaline Phosphatase 68 38 - 126 U/L   Total Bilirubin 0.5 0.3 - 1.2 mg/dL   GFR calc non Af Amer 59 (L) >60 mL/min   GFR calc Af Amer >60 >60 mL/min   Anion gap 12 5 - 15  CBC with Differential  Result Value Ref Range   WBC 10.4 4.0 - 10.5 K/uL   RBC 4.60 3.87 - 5.11 MIL/uL   Hemoglobin 13.4 12.0 - 15.0 g/dL   HCT 04.541.6 40.936.0 - 81.146.0 %   MCV 90.4 80.0 - 100.0 fL   MCH 29.1 26.0 - 34.0 pg   MCHC 32.2 30.0 -  36.0 g/dL   RDW 15.2 11.5 - 15.5 %   Platelets 269 150 - 400 K/uL   nRBC 0.0 0.0 - 0.2 %   Neutrophils Relative % 72 %   Neutro Abs 7.5 1.7 - 7.7 K/uL   Lymphocytes Relative 16 %   Lymphs Abs 1.6 0.7 - 4.0 K/uL   Monocytes Relative 9 %   Monocytes Absolute 0.9 0.1 - 1.0 K/uL   Eosinophils Relative 1 %   Eosinophils Absolute 0.1 0.0 - 0.5 K/uL   Basophils Relative 1 %   Basophils Absolute 0.1 0.0 - 0.1 K/uL   Immature Granulocytes 1 %   Abs Immature Granulocytes 0.09 (H) 0.00 - 0.07 K/uL  Brain natriuretic peptide  Result Value Ref Range   B Natriuretic Peptide 66.9 0.0 - 100.0 pg/mL  Urinalysis, Routine w reflex microscopic  Result Value Ref Range   Color, Urine YELLOW YELLOW   APPearance HAZY (A) CLEAR   Specific Gravity, Urine 1.006 1.005 - 1.030   pH 8.0 5.0 - 8.0   Glucose, UA NEGATIVE NEGATIVE mg/dL   Hgb urine dipstick NEGATIVE NEGATIVE   Bilirubin Urine NEGATIVE NEGATIVE   Ketones, ur NEGATIVE NEGATIVE mg/dL   Protein, ur NEGATIVE NEGATIVE mg/dL   Nitrite NEGATIVE NEGATIVE   Leukocytes,Ua LARGE (A) NEGATIVE   RBC / HPF 0-5 0 -  5 RBC/hpf   WBC, UA >50 (H) 0 - 5 WBC/hpf   Bacteria, UA RARE (A) NONE SEEN   Dg Chest Port 1 View  Result Date: 10/26/2018 CLINICAL DATA:  Weakness EXAM: PORTABLE CHEST 1 VIEW COMPARISON:  None. FINDINGS: Heart size is mildly enlarged. Aortic calcifications are noted. There is a left basilar airspace opacity. No pneumothorax. There appears to be a trace left-sided pleural effusion. There appear to be acute left-sided rib fractures as well as old healed left-sided rib fractures. IMPRESSION: 1. Acute appearing fracture involving the sixth rib anterior laterally on the left. There are additional old healed left-sided rib fractures. 2. No pneumothorax. 3. Left basilar airspace opacity which may represent an infiltrate or atelectasis in combination with a trace to small left-sided pleural effusion. 4. Cardiomegaly. Electronically Signed   By: Constance Holster M.D.   On: 10/26/2018 23:08    Procedures  MDM   Patient with generalized weakness worsening recently and urinary frequency.  Evidence of urinary tract infection.  Patient also with hyponatremia and hypokalemia.  She has not been eating well.  Will need admission.  1. Lower urinary tract infectious disease   2. Weakness         Zacherie Honeyman, Gwenlyn Perking 10/26/18 2343    Carmin Muskrat, MD 10/28/18 [phone]

## 2018-10-26 NOTE — ED Provider Notes (Signed)
[facility] DEPT Provider Note   CSN: [id number] Arrival date & time: 10/26/18  [place]     History   Chief Complaint Chief Complaint  Patient presents with  . Difficulty Walking    HPI Lindsey Norris is a 85 y.o. female.     HPI Patient presents with reported difficulty walking. Patient notes that she has had some generalized weakness, without focality. History is somewhat limited to the patient's extremely poor hearing.  History also limited due to the patient's history of dementia, level 5 caveat. Some attempts are made to communicate with a white dry erase board, though this is inconsistent as well. Patient denies pain, denies fever, denies vomiting. History is provided by patient and from her husband, via nursing staff. Reportedly patient has had increasing difficulty walking over the past week, with urinary frequency. Past Medical History:  Diagnosis Date  . Arthritis   . Chronic gastritis   . Colon polyp    dimnutive 27mm  . COPD (chronic obstructive pulmonary disease) ([place])   . Duodenitis without hemorrhage   . GERD (gastroesophageal reflux disease)   . Hearing loss   . Hemorrhoids, internal    & external  . Hypercholesteremia   . Hypertension   . Osteoporosis 2013/2015/2017   2013 T score -2.5, 2015 T score - 2.4, 2017 T score -2.3 improved at both hips from prior study  . Sclerosis of the skin    LICHENS SCLEROSIS  . Urticaria     Patient Active Problem List   Diagnosis Date Noted  . Seizures ([place]) 04/25/2017  . Syncope 04/25/2017  . Altered mental status 04/25/2017  . Arthritis   . Seasonal and perennial allergic rhinitis 05/09/2010  . Hyperlipemia 06/11/2007  . Hypertension 06/11/2007  . HEMORRHOIDS 06/11/2007  . COPD, MILD 06/11/2007  . OSTEOARTHRITIS 06/11/2007  . Personal history of colonic polyps 01/15/2007  . Asthma, mild intermittent, well-controlled 01/15/2007  . GERD 01/15/2007  . GASTRITIS, CHRONIC  04/22/2001    Past Surgical History:  Procedure Laterality Date  . APPENDECTOMY    . CATARACT EXTRACTION     bilateral x 2  . COLONOSCOPY    . DILATION AND CURETTAGE OF UTERUS  1969  . ESOPHAGOGASTRODUODENOSCOPY    . NOSE SURGERY     DEVIATED SEPTUM REPAIR  . REFRACTIVE SURGERY     LASER EYE SURGERY X2/ 2010 AND 2011     OB History    Gravida  0   Para      Term      Preterm      AB      Living        SAB      TAB      Ectopic      Multiple      Live Births               Home Medications    Prior to Admission medications   Medication Sig Start Date End Date Taking? Authorizing Provider  acetaminophen (TYLENOL) 325 MG tablet Take 2 tablets (650 mg total) by mouth every 6 (six) hours as needed for mild pain (or Fever >/= 101). 04/27/17   Aline August, MD  aspirin EC 81 MG tablet Take 81 mg by mouth daily.    [provider]  azelastine (ASTELIN) 0.1 % nasal spray Place 1 spray into both nostrils 2 (two) times daily. Use in each nostril as directed Patient not taking: Reported on 09/30/2018 05/28/16  Bobbitt, Heywood Ilesalph Carter, MD  EPINEPHrine (EPIPEN 2-PAK) 0.3 mg/0.3 mL IJ SOAJ injection Inject into thigh if needed for severe allergic reaction Patient not taking: Reported on 09/04/2016 02/24/14   Waymon BudgeYoung, Clinton D, MD  Multiple Vitamin (MULTIVITAMIN) capsule Take 1 capsule by mouth daily.      [provider]  omeprazole (PRILOSEC) 10 MG capsule Take 10 mg by mouth daily as needed (heartburn).     [provider]  sertraline (ZOLOFT) 25 MG tablet TAKE 1 TABLET BY MOUTH EVERY DAY Patient taking differently: Take 25 mg by mouth daily.  10/23/18   Penumalli, Glenford BayleyVikram R, MD  traMADol (ULTRAM) 50 MG tablet Take 50 mg by mouth every 4 (four) hours as needed for moderate pain.     [provider]  triamterene-hydrochlorothiazide (DYAZIDE) 37.5-25 MG per capsule Take 1 capsule by mouth daily.     [provider]    Family History  Family History  Problem Relation Age of Onset  . Hypertension Mother   . Asthma Mother   . Stroke Mother   . Heart disease Father   . Heart attack Father   . Uterine cancer Paternal Aunt   . Diabetes Maternal Grandfather   . Stomach cancer Paternal Grandfather   . Colon cancer Neg Hx   . Allergic rhinitis Neg Hx   . Angioedema Neg Hx   . Eczema Neg Hx   . Immunodeficiency Neg Hx   . Urticaria Neg Hx     Social History Social History   Tobacco Use  . Smoking status: Former Smoker    Packs/day: 0.10    Years: 4.00    Pack years: 0.40    Types: Cigarettes    Quit date: 02/27/1956    Years since quitting: 62.7  . Smokeless tobacco: Never Used  Substance Use Topics  . Alcohol use: No    Alcohol/week: 0.0 standard drinks  . Drug use: No     Allergies   Sulfonamide derivatives   Review of Systems Review of Systems  Unable to perform ROS: Dementia     Physical Exam Updated Vital Signs BP (!) 141/80   Pulse 97   Temp 98.5 F (36.9 C) (Oral)   Resp 18   SpO2 96%   Physical Exam Vitals signs and nursing note reviewed.  Constitutional:      Appearance: She is well-developed. She is ill-appearing.     Comments: Frail elderly female awake, alert, but inconsistently communicative.  HENT:     Head: Normocephalic and atraumatic.  Eyes:     Conjunctiva/sclera: Conjunctivae normal.  Cardiovascular:     Rate and Rhythm: Normal rate and regular rhythm.  Pulmonary:     Effort: Pulmonary effort is normal. No respiratory distress.     Breath sounds: Normal breath sounds. No stridor.  Abdominal:     General: There is no distension.  Musculoskeletal:     Comments: No gross deformities.  Skin:    General: Skin is warm and dry.  Neurological:     Mental Status: She is alert.     Motor: Atrophy present.     Comments: Cranial nerves grossly intact aside from hearing Patient does move all extremities spontaneously, has substantial atrophy.  Neuro exam somewhat limited  secondary to the patient's hearing loss, complaints.  Psychiatric:        Cognition and Memory: Cognition is impaired.      ED Treatments / Results  Labs (all labs ordered are listed, but only abnormal results  are displayed) Labs Reviewed  CBC WITH DIFFERENTIAL/PLATELET - Abnormal; Notable for the following components:      Result Value   Abs Immature Granulocytes 0.09 (*)    All other components within normal limits  COMPREHENSIVE METABOLIC PANEL  BRAIN NATRIURETIC PEPTIDE  URINALYSIS, ROUTINE W REFLEX MICROSCOPIC    EKG None  Radiology No results found.  Procedures Procedures (including critical care time)  Medications Ordered in ED Medications - No data to display   Initial Impression / Assessment and Plan / ED Course  I have reviewed the triage vital signs and the nursing notes.  Pertinent labs & imaging results that were available during my care of the patient were reviewed by me and considered in my medical decision making (see chart for details).  This elderly female presents with reported difficulty walking, weakness.  The patient's dementia interferes with a history, but she is awake, alert. Patient's initial vitals unremarkable, initial labs unremarkable, but on signout chemistry panel is pending.  On however, patient's initial studies to demonstrate concern for urinary tract infection, and initial therapy with ceftriaxone has started. Patient also awaiting x-ray. PA Muthersbaugh is aware the patient, her presentation, will disposition appropriately.  Final Clinical Impressions(s) / ED Diagnoses  Lower urinary tract disease weakness   Gerhard Munch, MD 10/26/18 2232

## 2018-10-27 ENCOUNTER — Encounter (HOSPITAL_COMMUNITY): Payer: Self-pay

## 2018-10-27 DIAGNOSIS — K219 Gastro-esophageal reflux disease without esophagitis: Secondary | ICD-10-CM

## 2018-10-27 DIAGNOSIS — Z7982 Long term (current) use of aspirin: Secondary | ICD-10-CM | POA: Diagnosis not present

## 2018-10-27 DIAGNOSIS — H9190 Unspecified hearing loss, unspecified ear: Secondary | ICD-10-CM | POA: Diagnosis present

## 2018-10-27 DIAGNOSIS — Z800 Family history of malignant neoplasm of digestive organs: Secondary | ICD-10-CM | POA: Diagnosis not present

## 2018-10-27 DIAGNOSIS — E7800 Pure hypercholesterolemia, unspecified: Secondary | ICD-10-CM | POA: Diagnosis present

## 2018-10-27 DIAGNOSIS — E785 Hyperlipidemia, unspecified: Secondary | ICD-10-CM | POA: Diagnosis present

## 2018-10-27 DIAGNOSIS — N390 Urinary tract infection, site not specified: Secondary | ICD-10-CM | POA: Diagnosis present

## 2018-10-27 DIAGNOSIS — N3000 Acute cystitis without hematuria: Secondary | ICD-10-CM | POA: Diagnosis not present

## 2018-10-27 DIAGNOSIS — Z882 Allergy status to sulfonamides status: Secondary | ICD-10-CM | POA: Diagnosis not present

## 2018-10-27 DIAGNOSIS — E876 Hypokalemia: Secondary | ICD-10-CM

## 2018-10-27 DIAGNOSIS — F0390 Unspecified dementia without behavioral disturbance: Secondary | ICD-10-CM | POA: Diagnosis present

## 2018-10-27 DIAGNOSIS — M1990 Unspecified osteoarthritis, unspecified site: Secondary | ICD-10-CM | POA: Diagnosis present

## 2018-10-27 DIAGNOSIS — E871 Hypo-osmolality and hyponatremia: Secondary | ICD-10-CM | POA: Diagnosis present

## 2018-10-27 DIAGNOSIS — R531 Weakness: Secondary | ICD-10-CM | POA: Diagnosis not present

## 2018-10-27 DIAGNOSIS — Z8249 Family history of ischemic heart disease and other diseases of the circulatory system: Secondary | ICD-10-CM | POA: Diagnosis not present

## 2018-10-27 DIAGNOSIS — E860 Dehydration: Secondary | ICD-10-CM | POA: Diagnosis present

## 2018-10-27 DIAGNOSIS — Z87891 Personal history of nicotine dependence: Secondary | ICD-10-CM | POA: Diagnosis not present

## 2018-10-27 DIAGNOSIS — Z825 Family history of asthma and other chronic lower respiratory diseases: Secondary | ICD-10-CM | POA: Diagnosis not present

## 2018-10-27 DIAGNOSIS — J449 Chronic obstructive pulmonary disease, unspecified: Secondary | ICD-10-CM | POA: Diagnosis present

## 2018-10-27 DIAGNOSIS — I10 Essential (primary) hypertension: Secondary | ICD-10-CM

## 2018-10-27 DIAGNOSIS — Z20828 Contact with and (suspected) exposure to other viral communicable diseases: Secondary | ICD-10-CM | POA: Diagnosis present

## 2018-10-27 DIAGNOSIS — Z9849 Cataract extraction status, unspecified eye: Secondary | ICD-10-CM | POA: Diagnosis not present

## 2018-10-27 DIAGNOSIS — Z79899 Other long term (current) drug therapy: Secondary | ICD-10-CM | POA: Diagnosis not present

## 2018-10-27 DIAGNOSIS — Z8049 Family history of malignant neoplasm of other genital organs: Secondary | ICD-10-CM | POA: Diagnosis not present

## 2018-10-27 DIAGNOSIS — M810 Age-related osteoporosis without current pathological fracture: Secondary | ICD-10-CM | POA: Diagnosis present

## 2018-10-27 LAB — COMPREHENSIVE METABOLIC PANEL
ALT: 12 U/L (ref 0–44)
AST: 15 U/L (ref 15–41)
Albumin: 3.3 g/dL — ABNORMAL LOW (ref 3.5–5.0)
Alkaline Phosphatase: 56 U/L (ref 38–126)
Anion gap: 8 (ref 5–15)
BUN: 14 mg/dL (ref 8–23)
CO2: 27 mmol/L (ref 22–32)
Calcium: 8.8 mg/dL — ABNORMAL LOW (ref 8.9–10.3)
Chloride: 97 mmol/L — ABNORMAL LOW (ref 98–111)
Creatinine, Ser: 0.74 mg/dL (ref 0.44–1.00)
GFR calc Af Amer: >60 mL/min (ref >60)
GFR calc non Af Amer: >60 mL/min (ref >60)
Glucose, Bld: 91 mg/dL (ref 70–99)
Potassium: 3.1 mmol/L — ABNORMAL LOW (ref 3.5–5.1)
Sodium: 132 mmol/L — ABNORMAL LOW (ref 135–145)
Total Bilirubin: 0.6 mg/dL (ref 0.3–1.2)
Total Protein: 6.1 g/dL — ABNORMAL LOW (ref 6.5–8.1)

## 2018-10-27 LAB — CBC
HCT: 38.2 % (ref 36.0–46.0)
Hemoglobin: 12.4 g/dL (ref 12.0–15.0)
MCH: 29.4 pg (ref 26.0–34.0)
MCHC: 32.5 g/dL (ref 30.0–36.0)
MCV: 90.5 fL (ref 80.0–100.0)
Platelets: 231 10*3/uL (ref 150–400)
RBC: 4.22 MIL/uL (ref 3.87–5.11)
RDW: 15 % (ref 11.5–15.5)
WBC: 8.4 10*3/uL (ref 4.0–10.5)
nRBC: 0 % (ref 0.0–0.2)

## 2018-10-27 LAB — SARS CORONAVIRUS 2 (TAT 6-24 HRS): SARS Coronavirus 2: NEGATIVE

## 2018-10-27 MED ORDER — IPRATROPIUM-ALBUTEROL 0.5-2.5 (3) MG/3ML IN SOLN: 3.0000 mL | RESPIRATORY_TRACT | Status: DC | PRN

## 2018-10-27 MED ORDER — SODIUM CHLORIDE 0.9 % IV SOLN
1.0000 g | INTRAVENOUS | Status: DC
  Administered 2018-10-27 – 2018-10-29 (×3): 1 g via INTRAVENOUS
  Filled 2018-10-27: qty 10
  Filled 2018-10-27: qty 1
  Filled 2018-10-27: qty 10
  Filled 2018-10-27: qty 1

## 2018-10-27 MED ORDER — KETOROLAC TROMETHAMINE 15 MG/ML IJ SOLN
15.0000 mg | Freq: Once | INTRAMUSCULAR | Status: DC
  Filled 2018-10-27: qty 1

## 2018-10-27 MED ORDER — ASPIRIN EC 81 MG PO TBEC
81.0000 mg | DELAYED_RELEASE_TABLET | Freq: Every day | ORAL | Status: DC
  Administered 2018-10-27 – 2018-10-31 (×5): 81 mg via ORAL
  Filled 2018-10-27 (×5): qty 1

## 2018-10-27 MED ORDER — ENOXAPARIN SODIUM 30 MG/0.3ML ~~LOC~~ SOLN
30.0000 mg | SUBCUTANEOUS | Status: DC
  Administered 2018-10-27 – 2018-10-31 (×5): 30 mg via SUBCUTANEOUS
  Filled 2018-10-27 (×5): qty 0.3

## 2018-10-27 MED ORDER — HYDRALAZINE HCL 10 MG PO TABS
10.0000 mg | ORAL_TABLET | Freq: Four times a day (QID) | ORAL | Status: DC | PRN
  Filled 2018-10-27: qty 1

## 2018-10-27 MED ORDER — ONDANSETRON HCL 4 MG/2ML IJ SOLN: 4.0000 mg | Freq: Four times a day (QID) | INTRAMUSCULAR | Status: DC | PRN

## 2018-10-27 MED ORDER — POTASSIUM CHLORIDE CRYS ER 20 MEQ PO TBCR
40.0000 meq | EXTENDED_RELEASE_TABLET | Freq: Two times a day (BID) | ORAL | Status: AC
  Administered 2018-10-27 (×2): 40 meq via ORAL
  Filled 2018-10-27 (×2): qty 2

## 2018-10-27 MED ORDER — TRAMADOL HCL 50 MG PO TABS
50.0000 mg | ORAL_TABLET | ORAL | Status: DC | PRN
  Administered 2018-10-27 – 2018-10-31 (×7): 50 mg via ORAL
  Filled 2018-10-27 (×8): qty 1

## 2018-10-27 MED ORDER — PANTOPRAZOLE SODIUM 40 MG PO TBEC
40.0000 mg | DELAYED_RELEASE_TABLET | Freq: Every day | ORAL | Status: DC
  Administered 2018-10-27 – 2018-10-31 (×5): 40 mg via ORAL
  Filled 2018-10-27 (×5): qty 1

## 2018-10-27 MED ORDER — ONDANSETRON HCL 4 MG PO TABS: 4.0000 mg | ORAL_TABLET | Freq: Four times a day (QID) | ORAL | Status: DC | PRN

## 2018-10-27 MED ORDER — ADULT MULTIVITAMIN W/MINERALS CH
1.0000 | ORAL_TABLET | Freq: Every day | ORAL | Status: DC
  Administered 2018-10-27 – 2018-10-31 (×5): 1 via ORAL
  Filled 2018-10-27 (×5): qty 1

## 2018-10-27 MED ORDER — HYDROXYZINE HCL 25 MG PO TABS
25.0000 mg | ORAL_TABLET | Freq: Once | ORAL | Status: AC
  Administered 2018-10-27: 20:00:00 25 mg via ORAL
  Filled 2018-10-27: qty 1

## 2018-10-27 MED ORDER — SERTRALINE HCL 25 MG PO TABS
25.0000 mg | ORAL_TABLET | Freq: Every day | ORAL | Status: DC
  Administered 2018-10-27 – 2018-10-31 (×5): 25 mg via ORAL
  Filled 2018-10-27 (×5): qty 1

## 2018-10-27 MED ORDER — HYDROXYZINE HCL 10 MG PO TABS
10.0000 mg | ORAL_TABLET | Freq: Three times a day (TID) | ORAL | Status: DC | PRN
  Administered 2018-10-27 – 2018-10-31 (×7): 10 mg via ORAL
  Filled 2018-10-27 (×8): qty 1

## 2018-10-27 MED ORDER — TRIAMTERENE-HCTZ 37.5-25 MG PO TABS
1.0000 | ORAL_TABLET | Freq: Every day | ORAL | Status: DC
  Administered 2018-10-27 – 2018-10-31 (×5): 1 via ORAL
  Filled 2018-10-27 (×5): qty 1

## 2018-10-27 MED ORDER — ACETAMINOPHEN 325 MG PO TABS
650.0000 mg | ORAL_TABLET | Freq: Four times a day (QID) | ORAL | Status: DC | PRN
  Administered 2018-10-28 – 2018-10-31 (×4): 650 mg via ORAL
  Filled 2018-10-27 (×4): qty 2

## 2018-10-27 NOTE — Progress Notes (Signed)
Pt arrived to unit in NAD. Pt and receiving RN utilized white board for communication and pt was able to answer simple questions like when her last BM was but was unable to answer questions relating to her admission documentation. Responses were inconsistent with questions asked. A&O to self and able to say she is in the hospital.

## 2018-10-27 NOTE — ED Notes (Signed)
ED TO INPATIENT HANDOFF REPORT  Name/Age/Gender Lindsey Norris 85 y.o. female  Code Status Code Status History    Date Active Date Inactive Code Status Order ID Comments User Context   04/26/2017 1247 04/27/2017 2053 DNR [id number]  Aline August, MD Inpatient   04/25/2017 1306 04/26/2017 1247 Full Code [id number]  [facility]   Advance Care Planning Activity    Questions for Most Recent Historical Code Status (Order [id number])    Question Answer Comment   In the event of cardiac or respiratory ARREST Do not call a "code blue"    In the event of cardiac or respiratory ARREST Do not perform Intubation, CPR, defibrillation or ACLS    In the event of cardiac or respiratory ARREST Use medication by any route, position, wound care, and other measures to relive pain and suffering. May use oxygen, suction and manual treatment of airway obstruction as needed for comfort.       Home/SNF/Other Home  Chief Complaint Urinary Frequencey and trouble ambulating  Level of Care/Admitting Diagnosis ED Disposition    ED Disposition Condition Comment   Admit  Hospital Area: [facility] [[id number]]  Level of Care: Med-Surg [16]  Covid Evaluation: Asymptomatic Screening Protocol (No Symptoms)  Diagnosis: UTI (urinary tract infection) [174081]  Admitting Physician: Elwyn Reach [[id number]]  Attending Physician: Elwyn Reach [[id number]]  Estimated length of stay: past midnight tomorrow  Certification:: I certify this patient will need inpatient services for at least 2 midnights  PT Class (Do Not Modify): Inpatient [101]  PT Acc Code (Do Not Modify): Private [1]       Medical History Past Medical History:  Diagnosis Date  . Arthritis   . Chronic gastritis   . Colon polyp    dimnutive 1mm  . COPD (chronic obstructive pulmonary disease) ([place])   . Duodenitis without hemorrhage   . GERD (gastroesophageal reflux disease)   . Hearing loss   . Hemorrhoids, internal    & external  . Hypercholesteremia   . Hypertension   . Osteoporosis 2013/2015/2017   2013 T score -2.5, 2015 T score - 2.4, 2017 T score -2.3 improved at both hips from prior study  . Sclerosis of the skin    LICHENS SCLEROSIS  . Urticaria     Allergies Allergies  Allergen Reactions  . Sulfonamide Derivatives     REACTION: vomiting    IV Location/Drains/Wounds Patient Lines/Drains/Airways Status   Active Line/Drains/Airways    Name:   Placement date:   Placement time:   Site:   Days:   Peripheral IV 10/26/18 Left Antecubital   10/26/18    2238    Antecubital   1          Labs/Imaging Results for orders placed or performed during the hospital encounter of 10/26/18 (from the past 48 hour(s))  Comprehensive metabolic panel     Status: Abnormal   Collection Time: 10/26/18  8:29 PM  Result Value Ref Range   Sodium 128 (L) 135 - 145 mmol/L   Potassium 3.2 (L) 3.5 - 5.1 mmol/L   Chloride 90 (L) 98 - 111 mmol/L   CO2 26 22 - 32 mmol/L   Glucose, Bld 105 (H) 70 - 99 mg/dL   BUN 16 8 - 23 mg/dL   Creatinine, Ser 0.87 0.44 - 1.00 mg/dL   Calcium 9.3 8.9 - 10.3 mg/dL   Total Protein 6.9 6.5 - 8.1 g/dL   Albumin 3.9 3.5 - 5.0 g/dL  AST 18 15 - 41 U/L   ALT 14 0 - 44 U/L   Alkaline Phosphatase 68 38 - 126 U/L   Total Bilirubin 0.5 0.3 - 1.2 mg/dL   GFR calc non Af Amer 59 (L) >60 mL/min   GFR calc Af Amer >60 >60 mL/min   Anion gap 12 5 - 15    Comment: Performed at [facility], [address] [place], [place] [address]  CBC with Differential     Status: Abnormal   Collection Time: 10/26/18  8:29 PM  Result Value Ref Range   WBC 10.4 4.0 - 10.5 K/uL   RBC 4.60 3.87 - 5.11 MIL/uL   Hemoglobin 13.4 12.0 - 15.0 g/dL   HCT 19.141.6 47.836.0 - 29.546.0 %   MCV 90.4 80.0 - 100.0 fL   MCH 29.1 26.0 - 34.0 pg   MCHC 32.2 30.0 - 36.0 g/dL   RDW 62.115.2 30.811.5 - 65.715.5 %   Platelets 269 150 - 400 K/uL   nRBC 0.0 0.0 - 0.2 %   Neutrophils Relative % 72 %   Neutro Abs 7.5 1.7  - 7.7 K/uL   Lymphocytes Relative 16 %   Lymphs Abs 1.6 0.7 - 4.0 K/uL   Monocytes Relative 9 %   Monocytes Absolute 0.9 0.1 - 1.0 K/uL   Eosinophils Relative 1 %   Eosinophils Absolute 0.1 0.0 - 0.5 K/uL   Basophils Relative 1 %   Basophils Absolute 0.1 0.0 - 0.1 K/uL   Immature Granulocytes 1 %   Abs Immature Granulocytes 0.09 (H) 0.00 - 0.07 K/uL    Comment: Performed at [facility], [address] [place], [place] [address]  Brain natriuretic peptide     Status: None   Collection Time: 10/26/18  8:29 PM  Result Value Ref Range   B Natriuretic Peptide 66.9 0.0 - 100.0 pg/mL    Comment: Performed at [facility], [address] [place], [place] [address]  Urinalysis, Routine w reflex microscopic     Status: Abnormal   Collection Time: 10/26/18  8:29 PM  Result Value Ref Range   Color, Urine YELLOW YELLOW   APPearance HAZY (A) CLEAR   Specific Gravity, Urine 1.006 1.005 - 1.030   pH 8.0 5.0 - 8.0   Glucose, UA NEGATIVE NEGATIVE mg/dL   Hgb urine dipstick NEGATIVE NEGATIVE   Bilirubin Urine NEGATIVE NEGATIVE   Ketones, ur NEGATIVE NEGATIVE mg/dL   Protein, ur NEGATIVE NEGATIVE mg/dL   Nitrite NEGATIVE NEGATIVE   Leukocytes,Ua LARGE (A) NEGATIVE   RBC / HPF 0-5 0 - 5 RBC/hpf   WBC, UA >50 (H) 0 - 5 WBC/hpf   Bacteria, UA RARE (A) NONE SEEN    Comment: Performed at [facility], [address] [place], [place] [address]   Dg Chest Port 1 View  Result Date: 10/26/2018 CLINICAL DATA:  Weakness EXAM: PORTABLE CHEST 1 VIEW COMPARISON:  None. FINDINGS: Heart size is mildly enlarged. Aortic calcifications are noted. There is a left basilar airspace opacity. No pneumothorax. There appears to be a trace left-sided pleural effusion. There appear to be acute left-sided rib fractures as well as old healed left-sided rib fractures. IMPRESSION: 1. Acute appearing fracture involving the sixth rib anterior laterally on the left. There  are additional old healed left-sided rib fractures. 2. No pneumothorax. 3. Left basilar airspace opacity which may represent an infiltrate or atelectasis in combination with a trace to small left-sided pleural effusion. 4.  Cardiomegaly. Electronically Signed   By: Katherine Mantle M.D.   On: 10/26/2018 23:08    Pending Labs Unresulted Labs (From admission, onward)    Start     Ordered   10/26/18 2336  SARS CORONAVIRUS 2 (TAT 6-12 HRS) Nasal Swab Aptima Multi Swab  (Asymptomatic/Tier 2 Patients Labs)  Once,   STAT    Question Answer Comment  Is this test for diagnosis or screening Screening   Symptomatic for COVID-19 as defined by CDC No   Hospitalized for COVID-19 No   Admitted to ICU for COVID-19 No   Previously tested for COVID-19 No   Resident in a congregate (group) care setting No   Employed in healthcare setting No   Pregnant No      10/26/18 2336   Signed and Held  CBC  (enoxaparin (LOVENOX)    CrCl < 30 ml/min)  Once,   R    Comments: Baseline for enoxaparin therapy IF NOT ALREADY DRAWN.  Notify MD if PLT < 100 K.    Signed and Held   Signed and Held  Creatinine, serum  (enoxaparin (LOVENOX)    CrCl < 30 ml/min)  Once,   R    Comments: Baseline for enoxaparin therapy IF NOT ALREADY DRAWN.    Signed and Held   Signed and Held  Creatinine, serum  (enoxaparin (LOVENOX)    CrCl < 30 ml/min)  Weekly,   R    Comments: while on enoxaparin therapy.    Signed and Held   Signed and Held  Comprehensive metabolic panel  Tomorrow morning,   R     Signed and Held   Signed and Held  CBC  Tomorrow morning,   R     Signed and Held          Vitals/Pain Today's Vitals   10/26/18 2134 10/26/18 2200 10/26/18 2230 10/27/18 0041  BP: 108/69 126/74 117/68 120/62  Pulse: 83  70 67  Resp: 19 14 16 16   Temp:      TempSrc:      SpO2: 97% 96% 95% 96%  PainSc:        Isolation Precautions No active isolations  Medications Medications  0.9 %  sodium chloride infusion (has no  administration in time range)  cefTRIAXone (ROCEPHIN) 1 g in sodium chloride 0.9 % 100 mL IVPB (0 g Intravenous Stopped 10/26/18 2310)    Mobility Not Ambulatory - Usually walks with a walker

## 2018-10-27 NOTE — Progress Notes (Signed)
PROGRESS NOTE    Lindsey Norris  KSH:[id number] DOB: August 19, 1926 DOA: 10/26/2018 PCP: Gaspar Garbe, MD    Brief Narrative:  85 y.o. female with medical history significant of COPD, osteoarthritis, GERD, hypertension osteoporosis hearing loss, hyperlipidemia who was brought in today due to significant weakness from home where she lives with her husband secondary to significant progressive weakness and inability to walk.  Patient has been unable to give adequate history due to poor hearing.  She denied any fall.  Denied any significant change in her diet.  Patient was noted to be dehydrated with evidence of UTI.  She could not even get out of her wheelchair.  She had to be assisted by 2 people.  She is therefore being admitted to the hospital with generalized weakness as well as UTI...  ED Course: Temperature 98.5 blood pressure 140/80 pulse 97, white count 10.8 hemoglobin 13.4 and platelets 269.  Sodium 128 potassium 3.2 chloride 90.  BUN is 16 creatinine 0.87 calcium 9.3 gap of 12.  The rest of the LFTs appear to be within normal.  COVID-19 screen is negative.  Urinalysis showed large leukocyte Estrace WBC more than 50 rare bacteria.  X-ray showed no active disease.  Patient is being admitted with UTI.  Also generalized weakness.  Assessment & Plan:   Principal Problem:   UTI (urinary tract infection) Active Problems:   Hyperlipemia   Hypertension   GERD   Weakness   Hypokalemia   Hyponatremia  #1 UTI:  -Presented with increased weakness and debility with UA findings worrisome for UTI -Blood and urine cultures are pending, obtained after pt was stated on abx -No presenting leukocytosis, afebrile, not septic -Will repeat CBC in AM -Follow culture results  #2 generalized weakness:  -Most likely secondary to UTI and dehydration with presenting hyponatremia -Continue IVF hydration as tolerated -Have consulted PT and OT  #3 hyperlipidemia: -Cont home regimen as tolerated   #4 hypertension:  -BP currently stable -Avoiding diuretic given concern of dehydration  #5 hypokalemia:  -Replaced -holding diuretic for now -Repeat bmet in AM  #6 hyponatremia:  -Suspect related to presenting dehydration -Much improved overnight with IVF hydration -Repeat bmet in AM  #7 COPD:  -Lungs clear. No audible wheezing -Will order q4prn nebs  DVT prophylaxis: Lovenox subQ Code Status: Full Family Communication: Pt in room, family not at bedside Disposition Plan: Uncertain at this time  Consultants:     Procedures:     Antimicrobials: Anti-infectives (From admission, onward)   Start     Dose/Rate Route Frequency Ordered Stop   10/27/18 2200  cefTRIAXone (ROCEPHIN) 1 g in sodium chloride 0.9 % 100 mL IVPB     1 g 200 mL/hr over 30 Minutes Intravenous Every 24 hours 10/27/18 0148     10/26/18 2230  cefTRIAXone (ROCEPHIN) 1 g in sodium chloride 0.9 % 100 mL IVPB     1 g 200 mL/hr over 30 Minutes Intravenous  Once 10/26/18 2228 10/26/18 2310       Subjective: Reports feeling somewhat better today  Objective: Vitals:   10/27/18 0100 10/27/18 0148 10/27/18 0519 10/27/18 1324  BP: 118/63 140/78 115/62 117/65  Pulse:  67 70 81  Resp: 17 18 18 18   Temp:  97.9 F (36.6 C) 97.6 F (36.4 C) 98.2 F (36.8 C)  TempSrc:  Axillary Axillary Oral  SpO2: 96% 100% 99% 96%  Weight:  43.4 kg      Intake/Output Summary (Last 24 hours) at 10/27/2018 1622 Last  data filed at 10/27/2018 1500 Gross per 24 hour  Intake 763.33 ml  Output 204 ml  Net 559.33 ml   Filed Weights   10/27/18 0148  Weight: 43.4 kg    Examination:  General exam: Appears calm and comfortable, dry membranes Respiratory system: Clear to auscultation. Respiratory effort normal. Cardiovascular system: S1 & S2 heard, RRR. Gastrointestinal system: Abdomen is nondistended, soft and nontender. No organomegaly or masses felt. Normal bowel sounds heard. Central nervous system: Alert and  oriented. No focal neurological deficits.hard of hearing Extremities: Symmetric 5 x 5 power. Skin: No rashes, lesions, poor skin turgor Psychiatry: Judgement and insight appear normal. Mood & affect appropriate.   Data Reviewed: I have personally reviewed following labs and imaging studies  CBC: Recent Labs  Lab 10/26/18 2029 10/27/18 0620  WBC 10.4 8.4  NEUTROABS 7.5  --   HGB 13.4 12.4  HCT 41.6 38.2  MCV 90.4 90.5  PLT 269 016   Basic Metabolic Panel: Recent Labs  Lab 10/26/18 2029 10/27/18 0620  NA 128* 132*  K 3.2* 3.1*  CL 90* 97*  CO2 26 27  GLUCOSE 105* 91  BUN 16 14  CREATININE 0.87 0.74  CALCIUM 9.3 8.8*   GFR: CrCl cannot be calculated (Unknown ideal weight.). Liver Function Tests: Recent Labs  Lab 10/26/18 2029 10/27/18 0620  AST 18 15  ALT 14 12  ALKPHOS 68 56  BILITOT 0.5 0.6  PROT 6.9 6.1*  ALBUMIN 3.9 3.3*   No results for input(s): LIPASE, AMYLASE in the last 168 hours. No results for input(s): AMMONIA in the last 168 hours. Coagulation Profile: No results for input(s): INR, PROTIME in the last 168 hours. Cardiac Enzymes: No results for input(s): CKTOTAL, CKMB, CKMBINDEX, TROPONINI in the last 168 hours. BNP (last 3 results) No results for input(s): PROBNP in the last 8760 hours. HbA1C: No results for input(s): HGBA1C in the last 72 hours. CBG: No results for input(s): GLUCAP in the last 168 hours. Lipid Profile: No results for input(s): CHOL, HDL, LDLCALC, TRIG, CHOLHDL, LDLDIRECT in the last 72 hours. Thyroid Function Tests: No results for input(s): TSH, T4TOTAL, FREET4, T3FREE, THYROIDAB in the last 72 hours. Anemia Panel: No results for input(s): VITAMINB12, FOLATE, FERRITIN, TIBC, IRON, RETICCTPCT in the last 72 hours. Sepsis Labs: No results for input(s): PROCALCITON, LATICACIDVEN in the last 168 hours.  Recent Results (from the past 240 hour(s))  SARS CORONAVIRUS 2 (TAT 6-24 HRS) Nasal Swab Aptima Multi Swab     Status: None    Collection Time: 10/26/18 11:36 PM   Specimen: Aptima Multi Swab; Nasal Swab  Result Value Ref Range Status   SARS Coronavirus 2 NEGATIVE NEGATIVE Final    Comment: (NOTE) SARS-CoV-2 target nucleic acids are NOT DETECTED. The SARS-CoV-2 RNA is generally detectable in upper and lower respiratory specimens during the acute phase of infection. Negative results do not preclude SARS-CoV-2 infection, do not rule out co-infections with other pathogens, and should not be used as the sole basis for treatment or other patient management decisions. Negative results must be combined with clinical observations, patient history, and epidemiological information. The expected result is Negative. Fact Sheet for Patients: SugarRoll.be Fact Sheet for Healthcare Providers: https://www.woods-mathews.com/ This test is not yet approved or cleared by the [place] FDA and  has been authorized for detection and/or diagnosis of SARS-CoV-2 by FDA under an Emergency Use Authorization (EUA). This EUA will remain  in effect (meaning this test can be used) for the duration of the  COVID-19 declaration under Section 56 4(b)(1) of the Act, 21 U.S.C. section 360bbb-3(b)(1), unless the authorization is terminated or revoked sooner. Performed at [facility], [address] [place], [place] [address]   Culture, blood (routine x 2)     Status: None (Preliminary result)   Collection Time: 10/27/18  8:45 AM   Specimen: BLOOD  Result Value Ref Range Status   Specimen Description   Final    BLOOD RIGHT ANTECUBITAL Performed at [facility], [address] [place], [place] [address]    Special Requests   Final    BOTTLES DRAWN AEROBIC AND ANAEROBIC Blood Culture adequate volume Performed at [facility], [address] [place], [place] [address]    Culture   Final    NO GROWTH < 12 HOURS Performed at [facility], [address] [place], [place] [address]    Report Status PENDING  Incomplete  Culture, blood (routine x 2)     Status: None (Preliminary result)   Collection Time: 10/27/18  8:47 AM   Specimen: BLOOD  Result Value Ref Range Status   Specimen Description   Final    BLOOD RIGHT ARM Performed at [facility], [address] [place], [place] [address]    Special Requests   Final    BOTTLES DRAWN AEROBIC ONLY Blood Culture results may not be optimal due to an inadequate volume of blood received in culture bottles Performed at [facility], [address] [place], [place] [address]    Culture   Final    NO GROWTH < 12 HOURS Performed at [facility], [address] [place], [place] [address]    Report Status PENDING  Incomplete     Radiology Studies: Dg Chest Port 1 View  Result Date: 10/26/2018 CLINICAL DATA:  Weakness EXAM: PORTABLE CHEST 1 VIEW COMPARISON:  None. FINDINGS: Heart size is mildly enlarged. Aortic calcifications are noted. There is a left basilar airspace opacity. No pneumothorax. There appears to be a trace left-sided pleural effusion. There appear to be acute left-sided rib fractures as well as old healed left-sided rib fractures. IMPRESSION: 1. Acute appearing fracture involving the sixth rib anterior laterally on the left. There are additional old healed left-sided rib fractures. 2. No pneumothorax. 3. Left basilar airspace opacity which may represent an infiltrate or atelectasis in combination with a trace to small left-sided pleural effusion. 4. Cardiomegaly. Electronically Signed   By: Katherine Mantlehristopher  Green M.D.   On: 10/26/2018 23:08    Scheduled Meds: . aspirin EC  81 mg Oral Daily  . enoxaparin (LOVENOX) injection  30 mg Subcutaneous Q24H  . multivitamin with minerals  1 tablet Oral Daily  . pantoprazole  40 mg Oral Daily  . potassium chloride  40 mEq Oral BID  . sertraline  25 mg Oral Daily  . triamterene-hydrochlorothiazide  1  tablet Oral Daily   Continuous Infusions: . sodium chloride 125 mL/hr at 10/27/18 1104  . cefTRIAXone (ROCEPHIN)  IV       LOS: 0 days   Rickey BarbaraStephen Tynisa Vohs, MD [facility] Pager On Amion  If 7PM-7AM, please contact night-coverage 10/27/2018, 4:22 PM

## 2018-10-27 NOTE — H&P (Signed)
History and Physical   Lindsey Norris ZOX:[id number] DOB: 1927-02-19 DOA: 10/26/2018  Referring MD/NP/PA: Dr. Jeraldine LootsLockwood PCP: Tisovec, Adelfa Kohichard W, MD   Outpatient Specialists: None  Patient coming from: Home  Chief Complaint: Generalized weakness  HPI: Lindsey Norris is a 85 y.o. female with medical history significant of COPD, osteoarthritis, GERD, hypertension osteoporosis hearing loss, hyperlipidemia who was brought in today due to significant weakness from home where she lives with her husband secondary to significant progressive weakness and inability to walk.  Patient has been unable to give adequate history due to poor hearing.  She denied any fall.  Denied any significant change in her diet.  Patient was noted to be dehydrated with evidence of UTI.  She could not even get out of her wheelchair.  She had to be assisted by 2 people.  She is therefore being admitted to the hospital with generalized weakness as well as UTI...  ED Course: Temperature 98.5 blood pressure 140/80 pulse 97, white count 10.8 hemoglobin 13.4 and platelets 269.  Sodium 128 potassium 3.2 chloride 90.  BUN is 16 creatinine 0.87 calcium 9.3 gap of 12.  The rest of the LFTs appear to be within normal.  COVID-19 screen is negative.  Urinalysis showed large leukocyte Estrace WBC more than 50 rare bacteria.  X-ray showed no active disease.  Patient is being admitted with UTI.  Also generalized weakness.  Review of Systems: As per HPI otherwise 10 point review of systems negative.    Past Medical History:  Diagnosis Date  . Arthritis   . Chronic gastritis   . Colon polyp    dimnutive 2mm  . COPD (chronic obstructive pulmonary disease) (HCC)   . Duodenitis without hemorrhage   . GERD (gastroesophageal reflux disease)   . Hearing loss   . Hemorrhoids, internal    & external  . Hypercholesteremia   . Hypertension   . Osteoporosis 2013/2015/2017   2013 T score -2.5, 2015 T score - 2.4, 2017 T score -2.3 improved  at both hips from prior study  . Sclerosis of the skin    LICHENS SCLEROSIS  . Urticaria     Past Surgical History:  Procedure Laterality Date  . APPENDECTOMY    . CATARACT EXTRACTION     bilateral x 2  . COLONOSCOPY    . DILATION AND CURETTAGE OF UTERUS  1969  . ESOPHAGOGASTRODUODENOSCOPY    . NOSE SURGERY     DEVIATED SEPTUM REPAIR  . REFRACTIVE SURGERY     LASER EYE SURGERY X2/ 2010 AND 2011     reports that she quit smoking about 62 years ago. Her smoking use included cigarettes. She has a 0.40 pack-year smoking history. She has never used smokeless tobacco. She reports that she does not drink alcohol or use drugs.  Allergies  Allergen Reactions  . Sulfonamide Derivatives     REACTION: vomiting    Family History  Problem Relation Age of Onset  . Hypertension Mother   . Asthma Mother   . Stroke Mother   . Heart disease Father   . Heart attack Father   . Uterine cancer Paternal Aunt   . Diabetes Maternal Grandfather   . Stomach cancer Paternal Grandfather   . Colon cancer Neg Hx   . Allergic rhinitis Neg Hx   . Angioedema Neg Hx   . Eczema Neg Hx   . Immunodeficiency Neg Hx   . Urticaria Neg Hx      Prior to Admission medications  Medication Sig Start Date End Date Taking? Authorizing Provider  acetaminophen (TYLENOL) 325 MG tablet Take 2 tablets (650 mg total) by mouth every 6 (six) hours as needed for mild pain (or Fever >/= 101). 04/27/17  Yes Aline August, MD  aspirin EC 81 MG tablet Take 81 mg by mouth daily.   Yes [provider]  esomeprazole (NEXIUM) 40 MG capsule Take 40 mg by mouth 2 (two) times daily before a meal.   Yes [provider]  Multiple Vitamin (MULTIVITAMIN) capsule Take 1 capsule by mouth daily.     Yes [provider]  sertraline (ZOLOFT) 25 MG tablet TAKE 1 TABLET BY MOUTH EVERY DAY Patient taking differently: Take 25 mg by mouth daily.  10/23/18  Yes Penumalli, Earlean Polka, MD  traMADol (ULTRAM) 50 MG tablet  Take 50 mg by mouth every 4 (four) hours as needed for moderate pain.    Yes [provider]  triamterene-hydrochlorothiazide (DYAZIDE) 37.5-25 MG per capsule Take 1 capsule by mouth daily.    Yes [provider]  azelastine (ASTELIN) 0.1 % nasal spray Place 1 spray into both nostrils 2 (two) times daily. Use in each nostril as directed Patient not taking: Reported on 09/30/2018 05/28/16   Bobbitt, [place] Muta, MD  EPINEPHrine (EPIPEN 2-PAK) 0.3 mg/0.3 mL IJ SOAJ injection Inject into thigh if needed for severe allergic reaction Patient not taking: Reported on 09/04/2016 02/24/14   Deneise Lever, MD    Physical Exam: Vitals:   10/26/18 2134 10/26/18 2200 10/26/18 2230 10/27/18 0041  BP: 108/69 126/74 117/68 120/62  Pulse: 83  70 67  Resp: 19 14 16 16   Temp:      TempSrc:      SpO2: 97% 96% 95% 96%      Constitutional: NAD, pleasant and weak Vitals:   10/26/18 2134 10/26/18 2200 10/26/18 2230 10/27/18 0041  BP: 108/69 126/74 117/68 120/62  Pulse: 83  70 67  Resp: 19 14 16 16   Temp:      TempSrc:      SpO2: 97% 96% 95% 96%   Eyes: PERRL, lids and conjunctivae normal ENMT: Mucous membranes are dry. Posterior pharynx clear of any exudate or lesions.Normal dentition.  Neck: normal, supple, no masses, no thyromegaly Respiratory: clear to auscultation bilaterally, no wheezing, no crackles. Normal respiratory effort. No accessory muscle use.  Cardiovascular: Regular rate and rhythm, no murmurs / rubs / gallops. No extremity edema. 2+ pedal pulses. No carotid bruits.  Abdomen: no tenderness, no masses palpated. No hepatosplenomegaly. Bowel sounds positive.  Musculoskeletal: no clubbing / cyanosis. No joint deformity upper and lower extremities. Good ROM, no contractures. Normal muscle tone.  Skin: no rashes, lesions, ulcers. No induration Neurologic: CN 2-12 grossly intact. Sensation intact, DTR normal. Strength 5/5 in all 4.  Excessive hair loss Psychiatric: Normal  judgment and insight. Alert and oriented x 3. Normal mood.     Labs on Admission: I have personally reviewed following labs and imaging studies  CBC: Recent Labs  Lab 10/26/18 2029  WBC 10.4  NEUTROABS 7.5  HGB 13.4  HCT 41.6  MCV 90.4  PLT 412   Basic Metabolic Panel: Recent Labs  Lab 10/26/18 2029  NA 128*  K 3.2*  CL 90*  CO2 26  GLUCOSE 105*  BUN 16  CREATININE 0.87  CALCIUM 9.3   GFR: CrCl cannot be calculated (Unknown ideal weight.). Liver Function Tests: Recent Labs  Lab 10/26/18 2029  AST 18  ALT 14  ALKPHOS  68  BILITOT 0.5  PROT 6.9  ALBUMIN 3.9   No results for input(s): LIPASE, AMYLASE in the last 168 hours. No results for input(s): AMMONIA in the last 168 hours. Coagulation Profile: No results for input(s): INR, PROTIME in the last 168 hours. Cardiac Enzymes: No results for input(s): CKTOTAL, CKMB, CKMBINDEX, TROPONINI in the last 168 hours. BNP (last 3 results) No results for input(s): PROBNP in the last 8760 hours. HbA1C: No results for input(s): HGBA1C in the last 72 hours. CBG: No results for input(s): GLUCAP in the last 168 hours. Lipid Profile: No results for input(s): CHOL, HDL, LDLCALC, TRIG, CHOLHDL, LDLDIRECT in the last 72 hours. Thyroid Function Tests: No results for input(s): TSH, T4TOTAL, FREET4, T3FREE, THYROIDAB in the last 72 hours. Anemia Panel: No results for input(s): VITAMINB12, FOLATE, FERRITIN, TIBC, IRON, RETICCTPCT in the last 72 hours. Urine analysis:    Component Value Date/Time   COLORURINE YELLOW 10/26/2018 2029   APPEARANCEUR HAZY (A) 10/26/2018 2029   LABSPEC 1.006 10/26/2018 2029   PHURINE 8.0 10/26/2018 2029   GLUCOSEU NEGATIVE 10/26/2018 2029   HGBUR NEGATIVE 10/26/2018 2029   BILIRUBINUR NEGATIVE 10/26/2018 2029   KETONESUR NEGATIVE 10/26/2018 2029   PROTEINUR NEGATIVE 10/26/2018 2029   UROBILINOGEN 0.2 08/12/2012 1116   NITRITE NEGATIVE 10/26/2018 2029   LEUKOCYTESUR LARGE (A) 10/26/2018 2029    Sepsis Labs: @LABRCNTIP (procalcitonin:4,lacticidven:4) )No results found for this or any previous visit (from the past 240 hour(s)).   Radiological Exams on Admission: Dg Chest Port 1 View  Result Date: 10/26/2018 CLINICAL DATA:  Weakness EXAM: PORTABLE CHEST 1 VIEW COMPARISON:  None. FINDINGS: Heart size is mildly enlarged. Aortic calcifications are noted. There is a left basilar airspace opacity. No pneumothorax. There appears to be a trace left-sided pleural effusion. There appear to be acute left-sided rib fractures as well as old healed left-sided rib fractures. IMPRESSION: 1. Acute appearing fracture involving the sixth rib anterior laterally on the left. There are additional old healed left-sided rib fractures. 2. No pneumothorax. 3. Left basilar airspace opacity which may represent an infiltrate or atelectasis in combination with a trace to small left-sided pleural effusion. 4. Cardiomegaly. Electronically Signed   By: Katherine Mantlehristopher  Green M.D.   On: 10/26/2018 23:08    EKG: Independently reviewed.  It shows sinus rhythm with atrial premature complexes.  No significant changes.  Assessment/Plan Principal Problem:   UTI (urinary tract infection) Active Problems:   Hyperlipemia   Hypertension   GERD   Weakness   Hypokalemia     #1 UTI: Patient is weak and debilitated.  Will admit the patient.  Initiate IV Rocephin empirically.  Urine and blood cultures sent.  Await results and adjust antibiotics as necessary.  #2 generalized weakness: Most likely secondary to UTI and dehydration.  She is significantly hyponatremic.  Hydrate patient with saline and monitor.  #3 hyperlipidemia: Continue home regimen.  #4 hypertension: Avoid diuretics.  Continue home regimen.  #5 hypokalemia: We will replete potassium.  Patient is on hydrochlorothiazide with triamterene.  We will hold off for now.  #6 hyponatremia: May be related to diuretics.  We will hold diuretics for now as we hydrate the  patient.  #7 COPD: No exacerbation.  Empiric inhalers.   DVT prophylaxis: Lovenox Code Status: Full code Family Communication: Husband over the phone Disposition Plan: To be determined Consults called: None Admission status: Inpatient  Severity of Illness: The appropriate patient status for this patient is INPATIENT. Inpatient status is judged to be reasonable and  necessary in order to provide the required intensity of service to ensure the patient's safety. The patient's presenting symptoms, physical exam findings, and initial radiographic and laboratory data in the context of their chronic comorbidities is felt to place them at high risk for further clinical deterioration. Furthermore, it is not anticipated that the patient will be medically stable for discharge from the hospital within 2 midnights of admission. The following factors support the patient status of inpatient.   " The patient's presenting symptoms include generalized weakness. " The worrisome physical exam findings include dry mucous member. " The initial radiographic and laboratory data are worrisome because of sodium of 128 and potassium of 3.2. " The chronic co-morbidities include osteoarthritis.   * I certify that at the point of admission it is my clinical judgment that the patient will require inpatient hospital care spanning beyond 2 midnights from the point of admission due to high intensity of service, high risk for further deterioration and high frequency of surveillance required.Lonia Blood MD [facility] Pager [phone]  If 7PM-7AM, please contact night-coverage [email or web address] Password [email or web address]  10/27/2018, 1:03 AM

## 2018-10-28 DIAGNOSIS — R531 Weakness: Secondary | ICD-10-CM

## 2018-10-28 LAB — URINE CULTURE

## 2018-10-28 LAB — BASIC METABOLIC PANEL
Anion gap: 12 (ref 5–15)
BUN: 7 mg/dL — ABNORMAL LOW (ref 8–23)
CO2: 22 mmol/L (ref 22–32)
Calcium: 8.7 mg/dL — ABNORMAL LOW (ref 8.9–10.3)
Chloride: 105 mmol/L (ref 98–111)
Creatinine, Ser: 0.56 mg/dL (ref 0.44–1.00)
GFR calc Af Amer: >60 mL/min (ref >60)
GFR calc non Af Amer: >60 mL/min (ref >60)
Glucose, Bld: 85 mg/dL (ref 70–99)
Potassium: 3.6 mmol/L (ref 3.5–5.1)
Sodium: 139 mmol/L (ref 135–145)

## 2018-10-28 NOTE — Progress Notes (Signed)
PROGRESS NOTE    Lindsey Norris  WUJ:[id number] DOB: 13-Sep-1926 DOA: 10/26/2018 PCP: Haywood Pao, MD    Brief Narrative:  85 y.o. female with medical history significant of COPD, osteoarthritis, GERD, hypertension osteoporosis hearing loss, hyperlipidemia who was brought in today due to significant weakness from home where she lives with her husband secondary to significant progressive weakness and inability to walk.  Patient has been unable to give adequate history due to poor hearing.  She denied any fall.  Denied any significant change in her diet.  Patient was noted to be dehydrated with evidence of UTI.  She could not even get out of her wheelchair.  She had to be assisted by 2 people.  She is therefore being admitted to the hospital with generalized weakness as well as UTI...  ED Course: Temperature 98.5 blood pressure 140/80 pulse 97, white count 10.8 hemoglobin 13.4 and platelets 269.  Sodium 128 potassium 3.2 chloride 90.  BUN is 16 creatinine 0.87 calcium 9.3 gap of 12.  The rest of the LFTs appear to be within normal.  COVID-19 screen is negative.  Urinalysis showed large leukocyte Estrace WBC more than 50 rare bacteria.  X-ray showed no active disease.  Patient is being admitted with UTI.  Also generalized weakness.  Assessment & Plan:   Principal Problem:   UTI (urinary tract infection) Active Problems:   Hyperlipemia   Hypertension   GERD   Weakness   Hypokalemia   Hyponatremia  #1 UTI:  -Presented with increased weakness and debility with UA findings worrisome for UTI -Blood and urine cultures are pending, obtained after pt was stated on abx -No presenting leukocytosis, afebrile, not septic -No leukocytosis -Blood cx neg  #2 generalized weakness:  -Most likely secondary to UTI and dehydration with presenting hyponatremia -Continue IVF hydration as tolerated -Have consulted PT and OT, thus far recs for SNF -Will consult SW  #3 hyperlipidemia: -Cont  home regimen as tolerated  #4 hypertension:  -Avoiding diuretic given concern of dehydration -Stable at this time  #5 hypokalemia:  -Replaced -holding diuretic for now -Repeat bmet in AM  #6 hyponatremia:  -Suspect related to presenting dehydration -Much improved overnight with IVF hydration -Repeat bmet in AM  #7 COPD:  -Lungs clear. No audible wheezing. On minimal O2 support -Will order q4prn nebs  DVT prophylaxis: Lovenox subQ Code Status: Full Family Communication: Pt in room, family not at bedside Disposition Plan: Uncertain at this time  Consultants:     Procedures:     Antimicrobials: Anti-infectives (From admission, onward)   Start     Dose/Rate Route Frequency Ordered Stop   10/27/18 2200  cefTRIAXone (ROCEPHIN) 1 g in sodium chloride 0.9 % 100 mL IVPB     1 g 200 mL/hr over 30 Minutes Intravenous Every 24 hours 10/27/18 0148     10/26/18 2230  cefTRIAXone (ROCEPHIN) 1 g in sodium chloride 0.9 % 100 mL IVPB     1 g 200 mL/hr over 30 Minutes Intravenous  Once 10/26/18 2228 10/26/18 2310      Subjective: Feeling better. Calling out for sitter to stay with her  Objective: Vitals:   10/27/18 0519 10/27/18 1324 10/27/18 2047 10/28/18 0646  BP: 115/62 117/65 99/71 135/81  Pulse: 70 81 62 72  Resp: 18 18 17 17   Temp: 97.6 F (36.4 C) 98.2 F (36.8 C) 98.2 F (36.8 C) 99 F (37.2 C)  TempSrc: Axillary Oral Oral Oral  SpO2: 99% 96% 96% 96%  Weight:  Intake/Output Summary (Last 24 hours) at 10/28/2018 1755 Last data filed at 10/28/2018 1046 Gross per 24 hour  Intake 2036.67 ml  Output 100 ml  Net 1936.67 ml   Filed Weights   10/27/18 0148  Weight: 43.4 kg    Examination: General exam: Awake, laying in bed, in nad Respiratory system: Normal respiratory effort, no wheezing Cardiovascular system: regular rate, s1, s2 Gastrointestinal system: Soft, nondistended, positive BS Central nervous system: CN2-12 grossly intact, strength  intact Extremities: Perfused, no clubbing Skin: Normal skin turgor, no notable skin lesions seen Psychiatry: Mood normal // no visual hallucinations   Data Reviewed: I have personally reviewed following labs and imaging studies  CBC: Recent Labs  Lab 10/26/18 2029 10/27/18 0620  WBC 10.4 8.4  NEUTROABS 7.5  --   HGB 13.4 12.4  HCT 41.6 38.2  MCV 90.4 90.5  PLT 269 231   Basic Metabolic Panel: Recent Labs  Lab 10/26/18 2029 10/27/18 0620 10/28/18 0829  NA 128* 132* 139  K 3.2* 3.1* 3.6  CL 90* 97* 105  CO2 26 27 22   GLUCOSE 105* 91 85  BUN 16 14 7*  CREATININE 0.87 0.74 0.56  CALCIUM 9.3 8.8* 8.7*   GFR: CrCl cannot be calculated (Unknown ideal weight.). Liver Function Tests: Recent Labs  Lab 10/26/18 2029 10/27/18 0620  AST 18 15  ALT 14 12  ALKPHOS 68 56  BILITOT 0.5 0.6  PROT 6.9 6.1*  ALBUMIN 3.9 3.3*   No results for input(s): LIPASE, AMYLASE in the last 168 hours. No results for input(s): AMMONIA in the last 168 hours. Coagulation Profile: No results for input(s): INR, PROTIME in the last 168 hours. Cardiac Enzymes: No results for input(s): CKTOTAL, CKMB, CKMBINDEX, TROPONINI in the last 168 hours. BNP (last 3 results) No results for input(s): PROBNP in the last 8760 hours. HbA1C: No results for input(s): HGBA1C in the last 72 hours. CBG: No results for input(s): GLUCAP in the last 168 hours. Lipid Profile: No results for input(s): CHOL, HDL, LDLCALC, TRIG, CHOLHDL, LDLDIRECT in the last 72 hours. Thyroid Function Tests: No results for input(s): TSH, T4TOTAL, FREET4, T3FREE, THYROIDAB in the last 72 hours. Anemia Panel: No results for input(s): VITAMINB12, FOLATE, FERRITIN, TIBC, IRON, RETICCTPCT in the last 72 hours. Sepsis Labs: No results for input(s): PROCALCITON, LATICACIDVEN in the last 168 hours.  Recent Results (from the past 240 hour(s))  SARS CORONAVIRUS 2 (TAT 6-24 HRS) Nasal Swab Aptima Multi Swab     Status: None   Collection  Time: 10/26/18 11:36 PM   Specimen: Aptima Multi Swab; Nasal Swab  Result Value Ref Range Status   SARS Coronavirus 2 NEGATIVE NEGATIVE Final    Comment: (NOTE) SARS-CoV-2 target nucleic acids are NOT DETECTED. The SARS-CoV-2 RNA is generally detectable in upper and lower respiratory specimens during the acute phase of infection. Negative results do not preclude SARS-CoV-2 infection, do not rule out co-infections with other pathogens, and should not be used as the sole basis for treatment or other patient management decisions. Negative results must be combined with clinical observations, patient history, and epidemiological information. The expected result is Negative. Fact Sheet for Patients: HairSlick.nohttps://www.fda.gov/media/138098/download Fact Sheet for Healthcare Providers: quierodirigir.comhttps://www.fda.gov/media/138095/download This test is not yet approved or cleared by the [place] FDA and  has been authorized for detection and/or diagnosis of SARS-CoV-2 by FDA under an Emergency Use Authorization (EUA). This EUA will remain  in effect (meaning this test can be used) for the duration of the COVID-19 declaration  under Section 56 4(b)(1) of the Act, 21 U.S.C. section 360bbb-3(b)(1), unless the authorization is terminated or revoked sooner. Performed at [facility], [address] [place], [place] [address]   Culture, blood (routine x 2)     Status: None (Preliminary result)   Collection Time: 10/27/18  8:45 AM   Specimen: BLOOD  Result Value Ref Range Status   Specimen Description   Final    BLOOD RIGHT ANTECUBITAL Performed at [facility], [address], [place], [place] [address]    Special Requests   Final    BOTTLES DRAWN AEROBIC AND ANAEROBIC Blood Culture adequate volume Performed at [facility], [address] [place], [place] [address]    Culture   Final    NO GROWTH < 24 HOURS Performed at [facility], [address]  [place], [place] [address]    Report Status PENDING  Incomplete  Culture, blood (routine x 2)     Status: None (Preliminary result)   Collection Time: 10/27/18  8:47 AM   Specimen: BLOOD  Result Value Ref Range Status   Specimen Description   Final    BLOOD RIGHT ARM Performed at [facility], [address] [place], [place] [address]    Special Requests   Final    BOTTLES DRAWN AEROBIC ONLY Blood Culture results may not be optimal due to an inadequate volume of blood received in culture bottles Performed at [facility], [address], [place], [place] [address]    Culture   Final    NO GROWTH < 24 HOURS Performed at [facility], [address], [place], [place] [address]    Report Status PENDING  Incomplete     Radiology Studies: Dg Chest Port 1 View  Result Date: 10/26/2018 CLINICAL DATA:  Weakness EXAM: PORTABLE CHEST 1 VIEW COMPARISON:  None. FINDINGS: Heart size is mildly enlarged. Aortic calcifications are noted. There is a left basilar airspace opacity. No pneumothorax. There appears to be a trace left-sided pleural effusion. There appear to be acute left-sided rib fractures as well as old healed left-sided rib fractures. IMPRESSION: 1. Acute appearing fracture involving the sixth rib anterior laterally on the left. There are additional old healed left-sided rib fractures. 2. No pneumothorax. 3. Left basilar airspace opacity which may represent an infiltrate or atelectasis in combination with a trace to small left-sided pleural effusion. 4. Cardiomegaly. Electronically Signed   By: Katherine Mantle M.D.   On: 10/26/2018 23:08    Scheduled Meds: . aspirin EC  81 mg Oral Daily  . enoxaparin (LOVENOX) injection  30 mg Subcutaneous Q24H  . ketorolac  15 mg Intravenous Once  . multivitamin with minerals  1 tablet Oral Daily  . pantoprazole  40 mg Oral Daily  . sertraline  25 mg Oral Daily  . triamterene-hydrochlorothiazide  1 tablet  Oral Daily   Continuous Infusions: . sodium chloride 125 mL/hr at 10/28/18 0557  . cefTRIAXone (ROCEPHIN)  IV 1 g (10/27/18 2215)     LOS: 1 day   Rickey Barbara, MD [facility] Pager On Amion  If 7PM-7AM, please contact night-coverage 10/28/2018, 5:55 PM

## 2018-10-28 NOTE — Evaluation (Signed)
Occupational Therapy Evaluation Patient Details Name: Lindsey Norris MRN: [id number] DOB: 1926-08-24 Today's Date: 10/28/2018    History of Present Illness 85 year old female was admitted with progressive weakness and inability to walk.  Dx'd with UTI.  PMH:  COPD, OA, hearing loss   Clinical Impression   This 85 year old female was admitted for the above. Pt is HOH; used white board to communicate. Pt was agreeable to therapy, but she was also cold and wanted to lie down ASAP despite having blankets around her.  Unsure of PLOF.  Husband not present. Will follow in acute setting with the goals listed below.     Follow Up Recommendations  SNF    Equipment Recommendations  3 in 1 bedside commode    Recommendations for Other Services       Precautions / Restrictions Precautions Precautions: Fall Precaution Comments: Pt HOH, uses white board to communicate Restrictions Weight Bearing Restrictions: No      Mobility Bed Mobility Overal bed mobility: Needs Assistance Bed Mobility: Sit to Supine;Sit to Sidelying       Sit to supine: Max assist;+2 for physical assistance Sit to sidelying: Mod assist;Max assist;+2 for physical assistance General bed mobility comments: assist for trunk and legs  Transfers Overall transfer level: Needs assistance Equipment used: Rolling walker (2 wheeled) Transfers: Sit to/from Stand Sit to Stand: Mod assist;+2 safety/equipment         General transfer comment: assist to rise and stabilize. Able to take a couple of sidesteps with min +2 safety    Balance Overall balance assessment: Needs assistance   Sitting balance-Leahy Scale: Fair       Standing balance-Leahy Scale: Poor                             ADL either performed or assessed with clinical judgement   ADL Overall ADL's : Needs assistance/impaired                                       General ADL Comments: Pt very cold during evaluation. She  did not want HOB lifted more than 20 degrees, so did not assess self feeding.  Based on cliical judgment, pt needs min A for grooming, mod for UB adls and total for LB adls     Vision         Perception     Praxis      Pertinent Vitals/Pain Pain Assessment: Faces(initially said nothing in general hurt) Faces Pain Scale: Hurts a little bit Pain Location: generalized; using neck roll Pain Intervention(s): Limited activity within patient's tolerance;Monitored during session;Repositioned     Hand Dominance     Extremity/Trunk Assessment Upper Extremity Assessment Upper Extremity Assessment: Generalized weakness           Communication Communication Communication: HOH(uses white board to communicate)   Cognition Arousal/Alertness: Awake/alert Behavior During Therapy: WFL for tasks assessed/performed Overall Cognitive Status: Difficult to assess                                     General Comments       Exercises     Shoulder Instructions      Home Living Family/patient expects to be discharged to:: Unsure Living Arrangements: Spouse/significant other  Additional Comments: had to write most things down to ask pt. She and her husband both have walkers      Prior Functioning/Environment          Comments: unsure of PLOF        OT Problem List: Decreased strength;Decreased activity tolerance;Impaired balance (sitting and/or standing);Pain      OT Treatment/Interventions: Self-care/ADL training;DME and/or AE instruction;Patient/family education;Balance training;Therapeutic activities    OT Goals(Current goals can be found in the care plan section) Acute Rehab OT Goals Patient Stated Goal: none stated agreeable to therapy OT Goal Formulation: Patient unable to participate in goal setting(communication) Time For Goal Achievement: 11/11/18 Potential to Achieve Goals: Fair ADL Goals Pt Will Perform  Eating: with set-up;sitting Pt Will Perform Grooming: with set-up;sitting Pt Will Perform Upper Body Bathing: with min assist Pt Will Perform Upper Body Dressing: with min assist Pt Will Transfer to Toilet: with min assist;bedside commode;stand pivot transfer Additional ADL Goal #1: pt will perform bed mobility with min A in preparation for adls Additional ADL Goal #2: pt will peform 2 sets of 10 AROM exercises to increase strength/endurance for adls  OT Frequency: Min 2X/week   Barriers to D/C:            Co-evaluation   Reason for Co-Treatment: For patient/therapist safety;To address functional/ADL transfers;Complexity of the patient's impairments (multi-system involvement) PT goals addressed during session: Mobility/safety with mobility;Balance;Strengthening/ROM        AM-PAC OT "6 Clicks" Daily Activity     Outcome Measure Help from another person eating meals?: A Lot Help from another person taking care of personal grooming?: A Lot Help from another person toileting, which includes using toliet, bedpan, or urinal?: Total Help from another person bathing (including washing, rinsing, drying)?: A Lot Help from another person to put on and taking off regular upper body clothing?: A Lot Help from another person to put on and taking off regular lower body clothing?: Total 6 Click Score: 10   End of Session    Activity Tolerance: Patient tolerated treatment well Patient left: in bed;with call bell/phone within reach;with bed alarm set  OT Visit Diagnosis: Unsteadiness on feet (R26.81);Muscle weakness (generalized) (M62.81)                Time: 7741-2878 OT Time Calculation (min): 14 min Charges:  OT General Charges $OT Visit: 1 Visit OT Evaluation $OT Eval Low Complexity: [place], OTR/L Acute Rehabilitation Services [phone] [facility] pager [phone] office 10/28/2018  [place] 10/28/2018, 1:11 PM

## 2018-10-28 NOTE — Evaluation (Signed)
Physical Therapy Evaluation Patient Details Name: Lindsey Norris MRN: [id number] DOB: 19-Dec-1926 Today's Date: 10/28/2018   History of Present Illness  85 year old female was admitted with progressive weakness and inability to walk.  Dx'd with UTI.  PMH:  COPD, OA, hearing loss  Clinical Impression  Unsure of patients baseline as she has known cognitive deficits plus increased confusion.  She did say that her and her husband both used walkers at home.  She currently demonstrates poor sitting and standing balance, increased agitation and needs +2 assist for all aspects of mobility. Pt will benefit from ongoing skilled acute PT services and PT recommends SNF for follow up at DC to ensure safety and decreased burden of care.     Follow Up Recommendations SNF;Supervision/Assistance - 24 hour    Equipment Recommendations  None recommended by PT    Recommendations for Other Services       Precautions / Restrictions Precautions Precautions: Fall Precaution Comments: Pt HOH, uses white board to communicate Restrictions Weight Bearing Restrictions: No      Mobility  Bed Mobility Overal bed mobility: Needs Assistance Bed Mobility: Sit to Supine;Sit to Sidelying       Sit to supine: Max assist;+2 for physical assistance Sit to sidelying: Mod assist;Max assist;+2 for physical assistance General bed mobility comments: assist for trunk and legs, pt did initiate a little more when getting back into bed.  Transfers Overall transfer level: Needs assistance Equipment used: Rolling walker (2 wheeled) Transfers: Sit to/from Stand Sit to Stand: Mod assist;+2 safety/equipment         General transfer comment: assist to rise and stabilize. Able to take a couple of sidesteps with min +2 safety  Ambulation/Gait             General Gait Details: Pt able to take a few side steps to the L with RW with +2 (min A).  Note difficulty moving LLE, however could step RLE without too much  difficulty.  Stairs            Wheelchair Mobility    Modified Rankin (Stroke Patients Only)       Balance Overall balance assessment: Needs assistance   Sitting balance-Leahy Scale: Poor   Postural control: Left lateral lean Standing balance support: During functional activity;Bilateral upper extremity supported Standing balance-Leahy Scale: Poor Standing balance comment: Needs B UE support and +2 for safety.                             Pertinent Vitals/Pain Pain Assessment: Faces Faces Pain Scale: Hurts a little bit Pain Location: generalized; using neck roll Pain Intervention(s): Monitored during session    Home Living Family/patient expects to be discharged to:: Unsure Living Arrangements: Spouse/significant other               Additional Comments: had to write most things down to ask pt. She and her husband both have walkers    Prior Function Level of Independence: Needs assistance   Gait / Transfers Assistance Needed: unsure of baseline     Comments: unsure of PLOF but she does report that husband used to help her at home before     Hand Dominance        Extremity/Trunk Assessment   Upper Extremity Assessment Upper Extremity Assessment: Defer to OT evaluation    Lower Extremity Assessment Lower Extremity Assessment: Generalized weakness    Cervical / Trunk Assessment Cervical / Trunk  Assessment: Kyphotic  Communication   Communication: HOH(uses white board to communicate)  Cognition Arousal/Alertness: Awake/alert Behavior During Therapy: Agitated Overall Cognitive Status: Difficult to assess                                        General Comments      Exercises     Assessment/Plan    PT Assessment Patient needs continued PT services  PT Problem List Decreased strength;Decreased activity tolerance;Decreased balance;Decreased mobility;Decreased cognition;Decreased knowledge of use of DME;Decreased  safety awareness;Decreased knowledge of precautions;Pain       PT Treatment Interventions DME instruction;Gait training;Functional mobility training;Therapeutic activities;Therapeutic exercise;Balance training;Patient/family education    PT Goals (Current goals can be found in the Care Plan section)  Acute Rehab PT Goals Patient Stated Goal: none stated agreeable to therapy PT Goal Formulation: Patient unable to participate in goal setting Time For Goal Achievement: 11/08/18 Potential to Achieve Goals: Fair    Frequency Min 3X/week   Barriers to discharge Inaccessible home environment;Decreased caregiver support unsure of assist she has at home, currently needs +2 for safety.    Co-evaluation PT/OT/SLP Co-Evaluation/Treatment: Yes Reason for Co-Treatment: For patient/therapist safety;To address functional/ADL transfers;Complexity of the patient's impairments (multi-system involvement) PT goals addressed during session: Mobility/safety with mobility;Balance;Strengthening/ROM         AM-PAC PT "6 Clicks" Mobility  Outcome Measure Help needed turning from your back to your side while in a flat bed without using bedrails?: A Lot Help needed moving from lying on your back to sitting on the side of a flat bed without using bedrails?: A Lot Help needed moving to and from a bed to a chair (including a wheelchair)?: A Lot Help needed standing up from a chair using your arms (e.g., wheelchair or bedside chair)?: A Lot Help needed to walk in hospital room?: A Lot Help needed climbing 3-5 steps with a railing? : Total 6 Click Score: 11    End of Session   Activity Tolerance: Treatment limited secondary to agitation;Patient limited by pain Patient left: in bed;with call bell/phone within reach;with bed alarm set Nurse Communication: Mobility status PT Visit Diagnosis: Unsteadiness on feet (R26.81);Other abnormalities of gait and mobility (R26.89);Muscle weakness (generalized)  (M62.81);Difficulty in walking, not elsewhere classified (R26.2)    Time: 9030-0923 PT Time Calculation (min) (ACUTE ONLY): 17 min   Charges:   PT Evaluation $PT Eval Moderate Complexity: 1 Mod         Micha Erck, PT, MPT  10/28/18, 1:21 PM   Lindsey Norris, Betha Loa 10/28/2018, 1:19 PM

## 2018-10-29 MED ORDER — LIP MEDEX EX OINT
1.0000 "application " | TOPICAL_OINTMENT | CUTANEOUS | Status: DC | PRN
  Administered 2018-10-29: 1 via TOPICAL
  Filled 2018-10-29: qty 7

## 2018-10-29 NOTE — [place] FL2 (Signed)
[place] LEVEL OF CARE SCREENING TOOL     IDENTIFICATION  Patient Name: Lindsey Norris Birthdate: 08-05-1926 Sex: female Admission Date (Current Location): 10/26/2018  [facility] and [place] Number:  Herbalist and Address:  [facility],  [place], [place]      Provider Number: [id number]  Attending Physician Name and Address:  Jonnie Finner, DO  Relative Name and Phone Number:  [phone] husband Gwenlyn Fudge [phone] son Kehinde Totzke.    Current Level of Care: Hospital Recommended Level of Care: [place] Prior Approval Number:    Date Approved/Denied:   PASRR Number: [id number]  Discharge Plan: SNF    Current Diagnoses: Patient Active Problem List   Diagnosis Date Noted  . Weakness 10/27/2018  . Hypokalemia 10/27/2018  . UTI (urinary tract infection) 10/27/2018  . Hyponatremia 10/27/2018  . Seizures ([place]) 04/25/2017  . Syncope 04/25/2017  . Altered mental status 04/25/2017  . Arthritis   . Seasonal and perennial allergic rhinitis 05/09/2010  . Hyperlipemia 06/11/2007  . Hypertension 06/11/2007  . HEMORRHOIDS 06/11/2007  . COPD, MILD 06/11/2007  . OSTEOARTHRITIS 06/11/2007  . Personal history of colonic polyps 01/15/2007  . Asthma, mild intermittent, well-controlled 01/15/2007  . GERD 01/15/2007  . GASTRITIS, CHRONIC 04/22/2001    Orientation RESPIRATION BLADDER Height & Weight     Self  Normal Incontinent Weight: 95 lb 10.9 oz (43.4 kg) Height:     BEHAVIORAL SYMPTOMS/MOOD NEUROLOGICAL BOWEL NUTRITION STATUS  (confusion, calls out for help, needs redirection)   Incontinent Diet(regular)  AMBULATORY STATUS COMMUNICATION OF NEEDS Skin   Extensive Assist Verbally Normal                       Personal Care Assistance Level of Assistance  Bathing, Feeding, Dressing Bathing Assistance: Maximum assistance Feeding assistance: Independent Dressing Assistance: Maximum  assistance     Functional Limitations Info  Sight, Hearing, Speech Sight Info: Adequate Hearing Info: Impaired(very hard of hearing) Speech Info: Adequate    SPECIAL CARE FACTORS FREQUENCY  PT (By licensed PT), OT (By licensed OT)     PT Frequency: 5x OT Frequency: 5x            Contractures Contractures Info: Not present    Additional Factors Info  Code Status, Allergies Code Status Info: full code Allergies Info: Sulfanomide Derivatives           Current Medications (10/29/2018):  This is the current hospital active medication list Current Facility-Administered Medications  Medication Dose Route Frequency Provider Last Rate Last Dose  . 0.9 %  sodium chloride infusion   Intravenous Continuous Elwyn Reach, MD 125 mL/hr at 10/29/18 0907    . acetaminophen (TYLENOL) tablet 650 mg  650 mg Oral Q6H PRN Elwyn Reach, MD   650 mg at 10/28/18 2251  . aspirin EC tablet 81 mg  81 mg Oral Daily Elwyn Reach, MD   81 mg at 10/29/18 0852  . cefTRIAXone (ROCEPHIN) 1 g in sodium chloride 0.9 % 100 mL IVPB  1 g Intravenous Q24H Gala Romney L, MD 200 mL/hr at 10/28/18 2209 1 g at 10/28/18 2209  . enoxaparin (LOVENOX) injection 30 mg  30 mg Subcutaneous Q24H Gala Romney L, MD   30 mg at 10/29/18 4481  . hydrOXYzine (ATARAX/VISTARIL) tablet 10 mg  10 mg Oral TID PRN Donne Hazel, MD   10 mg at 10/29/18 0954  . ipratropium-albuterol (DUONEB)  0.5-2.5 (3) MG/3ML nebulizer solution 3 mL  3 mL Nebulization Q4H PRN Jerald Kiefhiu, Stephen K, MD      . ketorolac (TORADOL) 15 MG/ML injection 15 mg  15 mg Intravenous Once Pearson GrippeKim, James, MD      . lip balm (CARMEX) ointment 1 application  1 application Topical PRN Margie EgeKyle, Tyrone A, DO   1 application at 10/29/18 [phone]  . multivitamin with minerals tablet 1 tablet  1 tablet Oral Daily Rometta EmeryGarba, Mohammad L, MD   1 tablet at 10/29/18 0852  . ondansetron (ZOFRAN) tablet 4 mg  4 mg Oral Q6H PRN Rometta EmeryGarba, Mohammad L, MD       Or  . ondansetron  (ZOFRAN) injection 4 mg  4 mg Intravenous Q6H PRN Earlie LouGarba, Mohammad L, MD      . pantoprazole (PROTONIX) EC tablet 40 mg  40 mg Oral Daily Rometta EmeryGarba, Mohammad L, MD   40 mg at 10/29/18 0852  . sertraline (ZOLOFT) tablet 25 mg  25 mg Oral Daily Rometta EmeryGarba, Mohammad L, MD   25 mg at 10/29/18 0852  . traMADol (ULTRAM) tablet 50 mg  50 mg Oral Q4H PRN Rometta EmeryGarba, Mohammad L, MD   50 mg at 10/29/18 0954  . triamterene-hydrochlorothiazide (MAXZIDE-25) 37.5-25 MG per tablet 1 tablet  1 tablet Oral Daily Rometta EmeryGarba, Mohammad L, MD   1 tablet at 10/29/18 [id number]     Discharge Medications: Please see discharge summary for a list of discharge medications.  Relevant Imaging Results:  Relevant Lab Results:   Additional Information SS#: [id number]  Nelwyn SalisburyMeghan R Aliahna Statzer, LCSW

## 2018-10-29 NOTE — TOC Initial Note (Signed)
Transition of Care [facility](TOC) - Initial/Assessment Note    Patient Details  Name: Lindsey Norris MRN: [id number] Date of Birth: Dec 07, 1926  Transition of Care [facility](TOC) CM/SW Contact:    Nelwyn SalisburyMeghan R Mandrell Vangilder, LCSW Phone Number: [phone] 10/29/2018, 11:36 AM  Clinical Narrative:   Pt admitted with UTI from home where she resides with her husband. Has dementia and hearing impairment. Has caregiver who assists husband with her care. Discussed SNF recommendation with husband- pt has been to SNF for rehab in the past so he is familiar with process. Wishes pt to be able to come directly home but feels at current level of need SNF would be optimal.  CSW completed FL2/referrals, will follow up with bed offers so that facility can be selected and insurance authorization requested.                Expected Discharge Plan: Skilled Nursing Facility Barriers to Discharge: Insurance Authorization, SNF Pending bed offer   Patient Goals and CMS Choice Patient states their goals for this hospitalization and ongoing recovery are:: pt unable to goal-set- husband reports wanting pt to come home but feeling rehab would be helpful at this level of need CMS Medicare.gov Compare Post Acute Care list provided to:: Other (Comment Required)(husband) Choice offered to / list presented to : (will present to husband when bed offers made)  Expected Discharge Plan and Services Expected Discharge Plan: Skilled Nursing Facility In-house Referral: Clinical Social Work   Post Acute Care Choice: Skilled Nursing Facility Living arrangements for the past 2 months: Single Family Home Expected Discharge Date: (unknown)                                    Prior Living Arrangements/Services Living arrangements for the past 2 months: Single Family Home Lives with:: Spouse Patient language and need for interpreter reviewed:: No Do you feel safe going back to the place where you live?: (UTA)      Need for Family Participation  in Patient Care: Yes (Comment)(pt with dementia) Care giver support system in place?: Yes (comment)(husband, 2-hour-a-day aide)   Criminal Activity/Legal Involvement Pertinent to Current Situation/Hospitalization: No - Comment as needed  Activities of Daily Living Home Assistive Devices/Equipment: Eyeglasses, Environmental consultantWalker (specify type) ADL Screening (condition at time of admission) Patient's cognitive ability adequate to safely complete daily activities?: No Is the patient deaf or have difficulty hearing?: Yes(very hoh-can communicate with white board) Does the patient have difficulty seeing, even when wearing glasses/contacts?: No Does the patient have difficulty concentrating, remembering, or making decisions?: Yes Patient able to express need for assistance with ADLs?: Yes Does the patient have difficulty dressing or bathing?: Yes Independently performs ADLs?: No Communication: Independent Dressing (OT): Dependent Is this a change from baseline?: Change from baseline, expected to last >3 days Grooming: Dependent Is this a change from baseline?: Change from baseline, expected to last >3 days Feeding: Dependent Is this a change from baseline?: Change from baseline, expected to last >3 days Bathing: Dependent Is this a change from baseline?: Change from baseline, expected to last >3 days Toileting: Dependent Is this a change from baseline?: Change from baseline, expected to last >3days In/Out Bed: Dependent Is this a change from baseline?: Change from baseline, expected to last >3 days Walks in Home: Dependent Is this a change from baseline?: Change from baseline, expected to last >3 days Does the patient have difficulty walking or climbing stairs?: Yes(secondary  to weakness) Weakness of Legs: Both Weakness of Arms/Hands: Both  Permission Sought/Granted Permission sought to share information with : Family Supports    Share Information with NAME: [phone] husband Gwenlyn Fudge  [phone] son Madelaine Etienne.           Emotional Assessment Appearance:: Appears stated age Attitude/Demeanor/Rapport: (confused) Affect (typically observed): Anxious Orientation: : Oriented to Self Alcohol / Substance Use: Not Applicable Psych Involvement: No (comment)  Admission diagnosis:  Lower urinary tract infectious disease [N39.0] Weakness [R53.1] Patient Active Problem List   Diagnosis Date Noted  . Weakness 10/27/2018  . Hypokalemia 10/27/2018  . UTI (urinary tract infection) 10/27/2018  . Hyponatremia 10/27/2018  . Seizures ([place]) 04/25/2017  . Syncope 04/25/2017  . Altered mental status 04/25/2017  . Arthritis   . Seasonal and perennial allergic rhinitis 05/09/2010  . Hyperlipemia 06/11/2007  . Hypertension 06/11/2007  . HEMORRHOIDS 06/11/2007  . COPD, MILD 06/11/2007  . OSTEOARTHRITIS 06/11/2007  . Personal history of colonic polyps 01/15/2007  . Asthma, mild intermittent, well-controlled 01/15/2007  . GERD 01/15/2007  . GASTRITIS, CHRONIC 04/22/2001   PCP:  Haywood Pao, MD Pharmacy:   [id number] [place], [place] [address] Phone: [phone] Fax: [phone]     Social Determinants of Health (SDOH) Interventions    Readmission Risk Interventions No flowsheet data found.

## 2018-10-29 NOTE — Progress Notes (Signed)
[name]  PROGRESS NOTE    Lindsey Norris  KGM:[id number] DOB: September 20, 1926 DOA: 10/26/2018 PCP: Haywood Pao, MD   Brief Narrative:   85 y.o.femalewith medical history significant ofCOPD, osteoarthritis, GERD, hypertension osteoporosis hearing loss, hyperlipidemia who was brought in today due to significant weakness from home where she lives with her husband secondary to significant progressive weakness and inability to walk. Patient has been unable to give adequate history due to poor hearing. She denied any fall. Denied any significant change in her diet. Patient was noted to be dehydrated with evidence of UTI. She could not even get out of her wheelchair. She had to be assisted by 2 people. She is therefore being admitted to the hospital with generalized weakness as well as UTI...   Assessment & Plan:   Principal Problem:   UTI (urinary tract infection) Active Problems:   Hyperlipemia   Hypertension   GERD   Weakness   Hypokalemia   Hyponatremia   UTI:     - Presented with increased weakness and debility with UA findings worrisome for UTI     - Blood and urine cultures are pending, obtained after pt was stated on abx     - No presenting leukocytosis, afebrile, not septic     - No leukocytosis     - Blood cx neg     - UCx w/ multiple species; let's continue rocephin through today and would change to PO abx (possible keflex in AM)  generalized weakness:      - Most likely secondary to UTI and dehydration with presenting hyponatremia     - Continue IVF hydration as tolerated     - Have consulted PT and OT, thus far recs for SNF     - SW consulted  hyperlipidemia:     - no home statin; monitor  hypertension:     - Avoiding diuretic given concern of dehydration     - BP stable at this time; monitor  hypokalemia:     - Replaced     - holding diuretic for now     - resolved  hyponatremia:     - Suspect related to presenting dehydration     - Much  improved overnight with IVF hydration     - resolved; decrease IVF  COPD:   - Lungs clear. No audible wheezing. On minimal O2 support   - Will order q4prn nebs  Alert and active this AM. Looks pretty good. Transition to PO abx in AM. Decrease IVF. Hopeful for d/c soon.   DVT prophylaxis: lovenox Code Status: FULL   Disposition Plan: TBD  Antimicrobials:  . Rocephin   ROS:  Denies CP, dyspnea, N, V. Remainder 10-pt ROS is negative for all not previously mentioned.  Subjective: "I hope you don't think I can hear you."  Objective: Vitals:   10/28/18 0646 10/28/18 2034 10/29/18 0513 10/29/18 1353  BP: 135/81 (!) 141/76 120/71 (!) 142/71  Pulse: 72 67 60 65  Resp: 17 18 18 16   Temp: 99 F (37.2 C) 97.6 F (36.4 C) 98.7 F (37.1 C) 98.2 F (36.8 C)  TempSrc: Oral Oral Oral Oral  SpO2: 96% 97% 97% 99%  Weight:        Intake/Output Summary (Last 24 hours) at 10/29/2018 1607 Last data filed at 10/29/2018 1504 Gross per 24 hour  Intake 2173.18 ml  Output 700 ml  Net 1473.18 ml   Filed Weights   10/27/18 0148  Weight: 43.4 kg  Examination:  General: 85 y.o. female resting in bed in NAD Eyes: PERRL, normal sclera ENMT: Nares patent w/o discharge, orophaynx clear, dentition normal, ears w/o discharge/lesions/ulcers Cardiovascular: RRR, +S1, S2, no m/g/r, equal pulses throughout Respiratory: CTABL, no w/r/r, normal WOB GI: BS+, NDNT, no masses noted, no organomegaly noted MSK: No e/c/c Skin: No rashes, bruises, ulcerations noted Neuro: alert to name, follows commands, hard of hearing Psyc: Appropriate interaction and affect, calm/cooperative   Data Reviewed: I have personally reviewed following labs and imaging studies.  CBC: Recent Labs  Lab 10/26/18 2029 10/27/18 0620  WBC 10.4 8.4  NEUTROABS 7.5  --   HGB 13.4 12.4  HCT 41.6 38.2  MCV 90.4 90.5  PLT 269 231   Basic Metabolic Panel: Recent Labs  Lab 10/26/18 2029 10/27/18 0620 10/28/18 0829  NA  128* 132* 139  K 3.2* 3.1* 3.6  CL 90* 97* 105  CO2 26 27 22   GLUCOSE 105* 91 85  BUN 16 14 7*  CREATININE 0.87 0.74 0.56  CALCIUM 9.3 8.8* 8.7*   GFR: CrCl cannot be calculated (Unknown ideal weight.). Liver Function Tests: Recent Labs  Lab 10/26/18 2029 10/27/18 0620  AST 18 15  ALT 14 12  ALKPHOS 68 56  BILITOT 0.5 0.6  PROT 6.9 6.1*  ALBUMIN 3.9 3.3*   No results for input(s): LIPASE, AMYLASE in the last 168 hours. No results for input(s): AMMONIA in the last 168 hours. Coagulation Profile: No results for input(s): INR, PROTIME in the last 168 hours. Cardiac Enzymes: No results for input(s): CKTOTAL, CKMB, CKMBINDEX, TROPONINI in the last 168 hours. BNP (last 3 results) No results for input(s): PROBNP in the last 8760 hours. HbA1C: No results for input(s): HGBA1C in the last 72 hours. CBG: No results for input(s): GLUCAP in the last 168 hours. Lipid Profile: No results for input(s): CHOL, HDL, LDLCALC, TRIG, CHOLHDL, LDLDIRECT in the last 72 hours. Thyroid Function Tests: No results for input(s): TSH, T4TOTAL, FREET4, T3FREE, THYROIDAB in the last 72 hours. Anemia Panel: No results for input(s): VITAMINB12, FOLATE, FERRITIN, TIBC, IRON, RETICCTPCT in the last 72 hours. Sepsis Labs: No results for input(s): PROCALCITON, LATICACIDVEN in the last 168 hours.  Recent Results (from the past 240 hour(s))  SARS CORONAVIRUS 2 (TAT 6-24 HRS) Nasal Swab Aptima Multi Swab     Status: None   Collection Time: 10/26/18 11:36 PM   Specimen: Aptima Multi Swab; Nasal Swab  Result Value Ref Range Status   SARS Coronavirus 2 NEGATIVE NEGATIVE Final    Comment: (NOTE) SARS-CoV-2 target nucleic acids are NOT DETECTED. The SARS-CoV-2 RNA is generally detectable in upper and lower respiratory specimens during the acute phase of infection. Negative results do not preclude SARS-CoV-2 infection, do not rule out co-infections with other pathogens, and should not be used as the sole  basis for treatment or other patient management decisions. Negative results must be combined with clinical observations, patient history, and epidemiological information. The expected result is Negative. Fact Sheet for Patients: HairSlick.no Fact Sheet for Healthcare Providers: quierodirigir.com This test is not yet approved or cleared by the [place] FDA and  has been authorized for detection and/or diagnosis of SARS-CoV-2 by FDA under an Emergency Use Authorization (EUA). This EUA will remain  in effect (meaning this test can be used) for the duration of the COVID-19 declaration under Section 56 4(b)(1) of the Act, 21 U.S.C. section 360bbb-3(b)(1), unless the authorization is terminated or revoked sooner. Performed at [facility], [address] [place], [place] [address]   Culture, Urine     Status: Abnormal   Collection Time: 10/27/18  7:42 AM   Specimen: Urine, Random  Result Value Ref Range Status   Specimen Description   Final    URINE, RANDOM Performed at [facility], [address] [place], [place] [address]    Special Requests   Final    NONE Performed at [facility], [address] [place], [place] [address]    Culture MULTIPLE SPECIES PRESENT, SUGGEST RECOLLECTION (A)  Final   Report Status 10/28/2018 FINAL  Final  Culture, blood (routine x 2)     Status: None (Preliminary result)   Collection Time: 10/27/18  8:45 AM   Specimen: BLOOD  Result Value Ref Range Status   Specimen Description   Final    BLOOD RIGHT ANTECUBITAL Performed at [facility], [address] [place], [place] [address]    Special Requests   Final    BOTTLES DRAWN AEROBIC AND ANAEROBIC Blood Culture adequate volume Performed at [facility], [address] [place], [place] [address]    Culture   Final    NO GROWTH 2 DAYS Performed at [facility], [address] [place], [place] [address]    Report Status PENDING  Incomplete  Culture, blood (routine x 2)     Status: None (Preliminary result)   Collection Time: 10/27/18  8:47 AM   Specimen: BLOOD  Result Value Ref Range Status   Specimen Description   Final    BLOOD RIGHT ARM Performed at [facility], [address] [place], [place] [address]    Special Requests   Final    BOTTLES DRAWN AEROBIC ONLY Blood Culture results may not be optimal due to an inadequate volume of blood received in culture bottles Performed at [facility], [address] [place], [place] [address]    Culture   Final    NO GROWTH 2 DAYS Performed at [facility], [address] [place], [place] [address]    Report Status PENDING  Incomplete      Radiology Studies: No results found.   Scheduled Meds: . aspirin EC  81 mg Oral Daily  . enoxaparin (LOVENOX) injection  30 mg Subcutaneous Q24H  . ketorolac  15 mg Intravenous Once  . multivitamin with minerals  1 tablet Oral Daily  . pantoprazole  40 mg Oral Daily  . sertraline  25 mg Oral Daily  . triamterene-hydrochlorothiazide  1 tablet Oral Daily   Continuous Infusions: . sodium chloride 125 mL/hr at 10/29/18 1504  . cefTRIAXone (ROCEPHIN)  IV 1 g (10/28/18 2209)     LOS: 2 days    Time spent: 25 minutes spent in the coordination of care today.    Teddy Spikeyrone A Israella Hubert, DO [facility] Pager [phone]  If 7PM-7AM, please contact night-coverage [email or web address] Password [email or web address] 10/29/2018, 4:07 PM

## 2018-10-30 LAB — CBC WITH DIFFERENTIAL/PLATELET
Abs Immature Granulocytes: 0.04 10*3/uL (ref 0.00–0.07)
Basophils Absolute: 0.1 10*3/uL (ref 0.0–0.1)
Basophils Relative: 1 %
Eosinophils Absolute: 0.2 10*3/uL (ref 0.0–0.5)
Eosinophils Relative: 2 %
HCT: 38.9 % (ref 36.0–46.0)
Hemoglobin: 12.5 g/dL (ref 12.0–15.0)
Immature Granulocytes: 1 %
Lymphocytes Relative: 20 %
Lymphs Abs: 1.6 10*3/uL (ref 0.7–4.0)
MCH: 29.6 pg (ref 26.0–34.0)
MCHC: 32.1 g/dL (ref 30.0–36.0)
MCV: 92.2 fL (ref 80.0–100.0)
Monocytes Absolute: 0.9 10*3/uL (ref 0.1–1.0)
Monocytes Relative: 11 %
Neutro Abs: 5.4 10*3/uL (ref 1.7–7.7)
Neutrophils Relative %: 65 %
Platelets: 210 10*3/uL (ref 150–400)
RBC: 4.22 MIL/uL (ref 3.87–5.11)
RDW: 15.1 % (ref 11.5–15.5)
WBC: 8.2 10*3/uL (ref 4.0–10.5)
nRBC: 0 % (ref 0.0–0.2)

## 2018-10-30 LAB — RENAL FUNCTION PANEL
Albumin: 2.9 g/dL — ABNORMAL LOW (ref 3.5–5.0)
Anion gap: 11 (ref 5–15)
BUN: 5 mg/dL — ABNORMAL LOW (ref 8–23)
CO2: 24 mmol/L (ref 22–32)
Calcium: 8.5 mg/dL — ABNORMAL LOW (ref 8.9–10.3)
Chloride: 100 mmol/L (ref 98–111)
Creatinine, Ser: 0.5 mg/dL (ref 0.44–1.00)
GFR calc Af Amer: >60 mL/min (ref >60)
GFR calc non Af Amer: >60 mL/min (ref >60)
Glucose, Bld: 81 mg/dL (ref 70–99)
Phosphorus: 2.9 mg/dL (ref 2.5–4.6)
Potassium: 3 mmol/L — ABNORMAL LOW (ref 3.5–5.1)
Sodium: 135 mmol/L (ref 135–145)

## 2018-10-30 LAB — MAGNESIUM: Magnesium: 1.7 mg/dL (ref 1.7–2.4)

## 2018-10-30 MED ORDER — POTASSIUM CHLORIDE CRYS ER 20 MEQ PO TBCR
40.0000 meq | EXTENDED_RELEASE_TABLET | Freq: Two times a day (BID) | ORAL | Status: AC
  Administered 2018-10-30 – 2018-10-31 (×3): 40 meq via ORAL
  Filled 2018-10-30 (×3): qty 2

## 2018-10-30 MED ORDER — POTASSIUM CHLORIDE CRYS ER 20 MEQ PO TBCR
40.0000 meq | EXTENDED_RELEASE_TABLET | Freq: Once | ORAL | Status: AC
  Administered 2018-10-30: 07:00:00 40 meq via ORAL
  Filled 2018-10-30: qty 2

## 2018-10-30 MED ORDER — CEPHALEXIN 250 MG PO CAPS
250.0000 mg | ORAL_CAPSULE | Freq: Four times a day (QID) | ORAL | Status: DC
  Administered 2018-10-30 – 2018-10-31 (×5): 250 mg via ORAL
  Filled 2018-10-30 (×7): qty 1

## 2018-10-30 NOTE — Care Management Important Message (Signed)
Important Message  Patient Details IM Letter given to Kathrin Greathouse SW to present to the Patient Name: Lindsey Norris MRN: [id number] Date of Birth: [birth year]   Medicare Important Message Given:  Yes     [name] 10/30/2018, 11:03 AM

## 2018-10-30 NOTE — Progress Notes (Signed)
[name].  PROGRESS NOTE    Lindsey Norris  WUJ:[id number] DOB: [birth year] DOA: 10/26/2018 PCP: Gaspar Garbeisovec, Richard W, MD   Brief Narrative:   85 y.o.femalewith medical history significant ofCOPD, osteoarthritis, GERD, hypertension osteoporosis hearing loss, hyperlipidemia who was brought in today due to significant weakness from home where she lives with her husband secondary to significant progressive weakness and inability to walk. Patient has been unable to give adequate history due to poor hearing. She denied any fall. Denied any significant change in her diet. Patient was noted to be dehydrated with evidence of UTI. She could not even get out of her wheelchair. She had to be assisted by 2 people. She is therefore being admitted to the hospital with generalized weakness as well as UTI.   Assessment & Plan:   Principal Problem:   UTI (urinary tract infection) Active Problems:   Hyperlipemia   Hypertension   GERD   Weakness   Hypokalemia   Hyponatremia   UTI:     - Presented with increased weakness and debility with UA findings worrisome for UTI     - Blood and urine cultures are pending, obtained after pt was stated on abx     - No presenting leukocytosis, afebrile, not septic     - No leukocytosis     - Blood cx neg     - UCx w/ multiple species; let's continue rocephin through today and would change to PO abx (possible keflex in AM)  generalized weakness:      - Most likely secondary to UTI and dehydration with presenting hyponatremia     - Continue IVF hydration as tolerated     - Have consulted PT and OT, thus far recs for SNF     - SW consulted  hyperlipidemia:     - no home statin; monitor  hypertension:     - Avoiding diuretic given concern of dehydration     - BP stable at this time; monitor  hypokalemia:     - Replaced     - holding diuretic for now     - resolved     - low again, add K+  hyponatremia:     - Suspect related to presenting  dehydration     - Much improved overnight with IVF hydration     - resolved; decrease IVF  COPD:   - Lungs clear. No audible wheezing. On minimal O2 support   - q4prn nebs  DVT prophylaxis: lovenox Code Status: FULL   Disposition Plan: TBD  Antimicrobials:   keflex  ROS:  Denies CP, dyspnea, N, V. Remainder 10-pt ROS is negative for all not previously mentioned.  Subjective: "It always has to do with the money. Then they put it on the patient."  Objective: Vitals:   10/29/18 1353 10/29/18 2051 10/30/18 0529 10/30/18 1345  BP: (!) 142/71 (!) 154/75 (!) 147/80 (!) 147/72  Pulse: 65 71 68 70  Resp: 16 19 16 16   Temp: 98.2 F (36.8 C) 98 F (36.7 C) 98.3 F (36.8 C) 97.8 F (36.6 C)  TempSrc: Oral   Oral  SpO2: 99% 96% 99% 94%  Weight:        Intake/Output Summary (Last 24 hours) at 10/30/2018 1517 Last data filed at 10/30/2018 0944 Gross per 24 hour  Intake 1508.83 ml  Output 1650 ml  Net -141.17 ml   Filed Weights   10/27/18 0148  Weight: 43.4 kg    Examination:  General: 85 y.o. female  resting in bed in NAD Eyes: PERRL, normal sclera ENMT: Nares patent w/o discharge, orophaynx clear, dentition normal, ears w/o discharge/lesions/ulcers Cardiovascular: RRR, +S1, S2, no m/g/r, equal pulses throughout Respiratory: CTABL, no w/r/r, normal WOB GI: BS+, NDNT, no masses noted, no organomegaly noted MSK: No e/c/c Neuro: alert to name, follows commands, hard of hearing Psyc: Appropriate interaction and affect, calm/cooperative   Data Reviewed: I have personally reviewed following labs and imaging studies.  CBC: Recent Labs  Lab 10/26/18 2029 10/27/18 0620 10/30/18 0606  WBC 10.4 8.4 8.2  NEUTROABS 7.5  --  5.4  HGB 13.4 12.4 12.5  HCT 41.6 38.2 38.9  MCV 90.4 90.5 92.2  PLT 269 231 210   Basic Metabolic Panel: Recent Labs  Lab 10/26/18 2029 10/27/18 0620 10/28/18 0829 10/30/18 0606  NA 128* 132* 139 135  K 3.2* 3.1* 3.6 3.0*  CL 90* 97*  105 100  CO2 26 27 22 24   GLUCOSE 105* 91 85 81  BUN 16 14 7* 5*  CREATININE 0.87 0.74 0.56 0.50  CALCIUM 9.3 8.8* 8.7* 8.5*  MG  --   --   --  1.7  PHOS  --   --   --  2.9   GFR: CrCl cannot be calculated (Unknown ideal weight.). Liver Function Tests: Recent Labs  Lab 10/26/18 2029 10/27/18 0620 10/30/18 0606  AST 18 15  --   ALT 14 12  --   ALKPHOS 68 56  --   BILITOT 0.5 0.6  --   PROT 6.9 6.1*  --   ALBUMIN 3.9 3.3* 2.9*   No results for input(s): LIPASE, AMYLASE in the last 168 hours. No results for input(s): AMMONIA in the last 168 hours. Coagulation Profile: No results for input(s): INR, PROTIME in the last 168 hours. Cardiac Enzymes: No results for input(s): CKTOTAL, CKMB, CKMBINDEX, TROPONINI in the last 168 hours. BNP (last 3 results) No results for input(s): PROBNP in the last 8760 hours. HbA1C: No results for input(s): HGBA1C in the last 72 hours. CBG: No results for input(s): GLUCAP in the last 168 hours. Lipid Profile: No results for input(s): CHOL, HDL, LDLCALC, TRIG, CHOLHDL, LDLDIRECT in the last 72 hours. Thyroid Function Tests: No results for input(s): TSH, T4TOTAL, FREET4, T3FREE, THYROIDAB in the last 72 hours. Anemia Panel: No results for input(s): VITAMINB12, FOLATE, FERRITIN, TIBC, IRON, RETICCTPCT in the last 72 hours. Sepsis Labs: No results for input(s): PROCALCITON, LATICACIDVEN in the last 168 hours.  Recent Results (from the past 240 hour(s))  SARS CORONAVIRUS 2 (TAT 6-24 HRS) Nasal Swab Aptima Multi Swab     Status: None   Collection Time: 10/26/18 11:36 PM   Specimen: Aptima Multi Swab; Nasal Swab  Result Value Ref Range Status   SARS Coronavirus 2 NEGATIVE NEGATIVE Final    Comment: (NOTE) SARS-CoV-2 target nucleic acids are NOT DETECTED. The SARS-CoV-2 RNA is generally detectable in upper and lower respiratory specimens during the acute phase of infection. Negative results do not preclude SARS-CoV-2 infection, do not rule out  co-infections with other pathogens, and should not be used as the sole basis for treatment or other patient management decisions. Negative results must be combined with clinical observations, patient history, and epidemiological information. The expected result is Negative. Fact Sheet for Patients: HairSlick.no Fact Sheet for Healthcare Providers: quierodirigir.com This test is not yet approved or cleared by the [place] FDA and  has been authorized for detection and/or diagnosis of SARS-CoV-2 by FDA under an Emergency Use  Authorization (EUA). This EUA will remain  in effect (meaning this test can be used) for the duration of the COVID-19 declaration under Section 56 4(b)(1) of the Act, 21 U.S.C. section 360bbb-3(b)(1), unless the authorization is terminated or revoked sooner. Performed at [facility], [place] [address] [place], [place] [address]   Culture, Urine     Status: Abnormal   Collection Time: 10/27/18  7:42 AM   Specimen: Urine, Random  Result Value Ref Range Status   Specimen Description   Final    URINE, RANDOM Performed at [place] [address], [place], [place] [address]    Special Requests   Final    NONE Performed at [facility], [place] [address], [place], [place] [address]    Culture MULTIPLE SPECIES PRESENT, SUGGEST RECOLLECTION (A)  Final   Report Status 10/28/2018 FINAL  Final  Culture, blood (routine x 2)     Status: None (Preliminary result)   Collection Time: 10/27/18  8:45 AM   Specimen: BLOOD  Result Value Ref Range Status   Specimen Description   Final    BLOOD RIGHT ANTECUBITAL Performed at [facility], [place] [address], [place], [place] [address]    Special Requests   Final    BOTTLES DRAWN AEROBIC AND ANAEROBIC Blood Culture adequate volume Performed at [place] [address] [place], [place] [address]     Culture   Final    NO GROWTH 3 DAYS Performed at [facility], [place] [address], [place], [place] [address]    Report Status PENDING  Incomplete  Culture, blood (routine x 2)     Status: None (Preliminary result)   Collection Time: 10/27/18  8:47 AM   Specimen: BLOOD  Result Value Ref Range Status   Specimen Description   Final    BLOOD RIGHT ARM Performed at [place] [address] [place], [place] [address]    Special Requests   Final    BOTTLES DRAWN AEROBIC ONLY Blood Culture results may not be optimal due to an inadequate volume of blood received in culture bottles Performed at [place] [address], [place], [place] [address]    Culture   Final    NO GROWTH 3 DAYS Performed at [facility], [place] [address] [place], [place] [address]    Report Status PENDING  Incomplete      Radiology Studies: No results found.   Scheduled Meds: . aspirin EC  81 mg Oral Daily  . enoxaparin (LOVENOX) injection  30 mg Subcutaneous Q24H  . ketorolac  15 mg Intravenous Once  . multivitamin with minerals  1 tablet Oral Daily  . pantoprazole  40 mg Oral Daily  . sertraline  25 mg Oral Daily  . triamterene-hydrochlorothiazide  1 tablet Oral Daily   Continuous Infusions: . sodium chloride 75 mL/hr at 10/30/18 0716  . cefTRIAXone (ROCEPHIN)  IV 1 g (10/29/18 2145)     LOS: 3 days    Time spent: 25 minutes spent in the coordination of care today.    Jonnie Finner, DO [facility] Pager [phone]  If 7PM-7AM, please contact night-coverage [email or web address] Password [email or web address] 10/30/2018, 3:17 PM

## 2018-10-30 NOTE — Progress Notes (Signed)
Physical Therapy Treatment Patient Details Name: Lindsey Norris MRN: [id number] DOB: 05-03-1926 Today's Date: 10/30/2018    History of Present Illness 85 year old female was admitted with progressive weakness and inability to walk.  Dx'd with UTI.  PMH:  COPD, OA, hearing loss    PT Comments    Progressing slowly with mobility.    Follow Up Recommendations  SNF     Equipment Recommendations  None recommended by PT    Recommendations for Other Services       Precautions / Restrictions Precautions Precautions: Fall Precaution Comments: Pt HOH, uses white board to communicate. Pt is very fearful of being left alone Restrictions Weight Bearing Restrictions: No    Mobility  Bed Mobility Overal bed mobility: Needs Assistance Bed Mobility: Rolling;Sidelying to Sit Rolling: Min assist Sidelying to sit: Mod assist       General bed mobility comments: assist for trunk and legs. increased time. multimodal cueing.  Transfers Overall transfer level: Needs assistance Equipment used: Rolling walker (2 wheeled) Transfers: Sit to/from Stand Sit to Stand: Min assist;+2 physical assistance;+2 safety/equipment         General transfer comment: Assist to rise, stabilize, control descent. Multimodal cueing required.  Ambulation/Gait Ambulation/Gait assistance: Min assist;+2 safety/equipment;+2 physical assistance Gait Distance (Feet): 7 Feet Assistive device: Rolling walker (2 wheeled) Gait Pattern/deviations: Step-through pattern;Decreased stride length;Trunk flexed     General Gait Details: Assist to stabilize pt and maneuver RW. Slow, short steps. Pt did not want to walk in hallway.   Stairs             Wheelchair Mobility    Modified Rankin (Stroke Patients Only)       Balance Overall balance assessment: Needs assistance         Standing balance support: Bilateral upper extremity supported Standing balance-Leahy Scale: Poor                               Cognition Arousal/Alertness: Awake/alert Behavior During Therapy: Restless;Anxious Overall Cognitive Status: Difficult to assess                                 General Comments: hx of dementia      Exercises      General Comments        Pertinent Vitals/Pain Pain Assessment: No/denies pain    Home Living                      Prior Function            PT Goals (current goals can now be found in the care plan section) Progress towards PT goals: Progressing toward goals    Frequency    Min 3X/week      PT Plan Current plan remains appropriate    Co-evaluation              AM-PAC PT "6 Clicks" Mobility   Outcome Measure  Help needed turning from your back to your side while in a flat bed without using bedrails?: A Lot Help needed moving from lying on your back to sitting on the side of a flat bed without using bedrails?: A Lot Help needed moving to and from a bed to a chair (including a wheelchair)?: A Lot Help needed standing up from a chair using your arms (e.g., wheelchair or bedside chair)?:  A Lot Help needed to walk in hospital room?: A Lot Help needed climbing 3-5 steps with a railing? : Total 6 Click Score: 11    End of Session Equipment Utilized During Treatment: Gait belt Activity Tolerance: Patient tolerated treatment well Patient left: in chair;with call bell/phone within reach;with nursing/sitter in room   PT Visit Diagnosis: Unsteadiness on feet (R26.81);Other abnormalities of gait and mobility (R26.89);Muscle weakness (generalized) (M62.81);Difficulty in walking, not elsewhere classified (R26.2)     Time: 7591-6384 PT Time Calculation (min) (ACUTE ONLY): 21 min  Charges:  $Gait Training: 8-22 mins                       Weston Anna, PT Acute Rehabilitation Services Pager: [phone] Office: [phone]

## 2018-10-30 NOTE — TOC Progression Note (Addendum)
Transition of Care [facility]) - Progression Note    Patient Details  Name: Lindsey Norris MRN: [id number] Date of Birth: December 29, 1926  Transition of Care [facility]) CM/SW [place], [place] Phone Number: 10/30/2018, 10:06 AM  Clinical Narrative:    CSW reached out to the patient son and spouse to discuss SNF-rehab options. Left voicemail for patient son, unable to leave message for spouse.   Patient son declines SNF placement and prefers the patient return home with Home Health services. Patient son lives in [place] and has discussed extensively with the patient spouse about the patient plan to return home. CSW explain Home Health process and informed him about Medicare.gov and ratings. Patient son understands skill services are a few times a week for about 1 hour or less a day. He reports understanding. Son is also arranging for personal caregivers.  Son will follow up with Son in the am.    Expected Discharge Plan: Skilled Nursing Facility Barriers to Discharge: Ship broker, SNF Pending bed offer  Expected Discharge Plan and Services Expected Discharge Plan: [place] In-house Referral: Clinical Social Work   Post Acute Care Choice: [place] Living arrangements for the past 2 months: Single Family Home Expected Discharge Date: (unknown)                                     Social Determinants of Health (SDOH) Interventions    Readmission Risk Interventions No flowsheet data found.

## 2018-10-31 LAB — RENAL FUNCTION PANEL
Albumin: 3.1 g/dL — ABNORMAL LOW (ref 3.5–5.0)
Anion gap: 8 (ref 5–15)
BUN: 8 mg/dL (ref 8–23)
CO2: 22 mmol/L (ref 22–32)
Calcium: 9 mg/dL (ref 8.9–10.3)
Chloride: 106 mmol/L (ref 98–111)
Creatinine, Ser: 0.58 mg/dL (ref 0.44–1.00)
GFR calc Af Amer: >60 mL/min (ref >60)
GFR calc non Af Amer: >60 mL/min (ref >60)
Glucose, Bld: 84 mg/dL (ref 70–99)
Phosphorus: 2.8 mg/dL (ref 2.5–4.6)
Potassium: 4.6 mmol/L (ref 3.5–5.1)
Sodium: 136 mmol/L (ref 135–145)

## 2018-10-31 LAB — CBC WITH DIFFERENTIAL/PLATELET
Abs Immature Granulocytes: 0.04 10*3/uL (ref 0.00–0.07)
Basophils Absolute: 0.1 10*3/uL (ref 0.0–0.1)
Basophils Relative: 1 %
Eosinophils Absolute: 0.2 10*3/uL (ref 0.0–0.5)
Eosinophils Relative: 3 %
HCT: 41.1 % (ref 36.0–46.0)
Hemoglobin: 12.9 g/dL (ref 12.0–15.0)
Immature Granulocytes: 1 %
Lymphocytes Relative: 27 %
Lymphs Abs: 2 10*3/uL (ref 0.7–4.0)
MCH: 29 pg (ref 26.0–34.0)
MCHC: 31.4 g/dL (ref 30.0–36.0)
MCV: 92.4 fL (ref 80.0–100.0)
Monocytes Absolute: 0.8 10*3/uL (ref 0.1–1.0)
Monocytes Relative: 10 %
Neutro Abs: 4.2 10*3/uL (ref 1.7–7.7)
Neutrophils Relative %: 58 %
Platelets: 235 10*3/uL (ref 150–400)
RBC: 4.45 MIL/uL (ref 3.87–5.11)
RDW: 15.2 % (ref 11.5–15.5)
WBC: 7.2 10*3/uL (ref 4.0–10.5)
nRBC: 0 % (ref 0.0–0.2)

## 2018-10-31 LAB — MAGNESIUM: Magnesium: 1.9 mg/dL (ref 1.7–2.4)

## 2018-10-31 MED ORDER — CEPHALEXIN 250 MG PO CAPS: 250.0000 mg | ORAL_CAPSULE | Freq: Four times a day (QID) | ORAL | 0 refills | Status: AC

## 2018-10-31 NOTE — Progress Notes (Signed)
Went over d/c instructions with patient's son.  PTAR called.  Oncoming nurse made aware.

## 2018-10-31 NOTE — Progress Notes (Signed)
Lindsey Norris  PROGRESS NOTE    Lindsey Norris  JKK:[id number] DOB: Dec 14, 1926 DOA: 10/26/2018 PCP: Haywood Pao, MD   Brief Narrative:   85 y.o.femalewith medical history significant ofCOPD, osteoarthritis, GERD, hypertension osteoporosis hearing loss, hyperlipidemia who was brought in today due to significant weakness from home where she lives with her husband secondary to significant progressive weakness and inability to walk. Patient has been unable to give adequate history due to poor hearing. She denied any fall. Denied any significant change in her diet. Patient was noted to be dehydrated with evidence of UTI. She could not even get out of her wheelchair. She had to be assisted by 2 people. She is therefore being admitted to the hospital with generalized weakness as well as UTI.   Assessment & Plan:   Principal Problem:   UTI (urinary tract infection) Active Problems:   Hyperlipemia   Hypertension   GERD   Weakness   Hypokalemia   Hyponatremia   UTI: - Presented with increased weakness and debility with UA findings worrisome for UTI - Blood and urine cultures are pending, obtained after pt was stated on abx - No presenting leukocytosis, afebrile, not septic - No leukocytosis - Blood cx neg - UCx w/ multiple species; let's continue rocephin through today and would change to PO abx (possible keflex in AM)     - Now on keflex through 11/01/2018  generalized weakness:  - Most likely secondary to UTI and dehydration with presenting hyponatremia - Continue IVF hydration as tolerated - Have consulted PT and OT, thus far recs for SNF - SW consulted     - family declines SNF option; requesting HH; see below  hyperlipidemia: - no home statin; monitor  hypertension: - Avoiding diuretic given concern of dehydration - BP stable at this time; monitor  hypokalemia: - Replaced - holding diuretic for now  - resolved - low again, add K+     - resolved  hyponatremia: - Suspect related to presenting dehydration - Much improved overnight with IVF hydration - resolved  COPD: - Lungs clear. No audible wheezing. On minimal O2 support - q4prn nebs  Long conversation with son and spouse about discharge options. It is recommended that she go to SNF. Her son does not want this to happen. Her spouse is deferring to her son's wishes. Her son states "I shouldn't have to tell you COVID is a problem in the rehab facilities. You all advocate more for them than you do for the patient. I'm sure there is a financial benefit to you." He also stated that the patient had a bad interaction with a SNF in the past, and that his opinion was that it would be no benefit to her to go to another. "All they did was take her medicaid money." I discussed my role as a physician in the care of his mother and how I have no financial connection with any rehab facility. I discussed the concern with COVID. I discussed my concern with her being in a safe environment. I told him that it is my medical opinion that she should go to a SNF for a short time, but I can not force this option. He asked me to discuss this with his father. I did. The father deferred to the son. The son wishes her to go home with [facility]. The son is arranging for caregivers in the home.   DVT prophylaxis:lovenox Code Status:FULL Disposition Plan:TBD  Antimicrobials:  keflex  ROS: Denies CP, dyspnea, N,  V. Remainder 10-pt ROS is negative for all not previously mentioned.  Subjective: Agitation ON.  Objective: Vitals:   10/30/18 0529 10/30/18 1345 10/30/18 2118 10/31/18 0448  BP: (!) 147/80 (!) 147/72 136/78 138/74  Pulse: 68 70 75 70  Resp: 16 16 16 16   Temp: 98.3 F (36.8 C) 97.8 F (36.6 C) 99 F (37.2 C) (!) 97.5 F (36.4 C)  TempSrc:  Oral Oral   SpO2: 99% 94% 95% 95%  Weight:        Intake/Output  Summary (Last 24 hours) at 10/31/2018 1326 Last data filed at 10/31/2018 1100 Gross per 24 hour  Intake 591 ml  Output 850 ml  Net -259 ml   Filed Weights   10/27/18 0148  Weight: 43.4 kg    Examination:  General:85 y.o.femaleresting in bed in NAD Eyes: PERRL, normal sclera ENMT: Nares patent w/o discharge, orophaynx clear, dentition normal, ears w/o discharge/lesions/ulcers Cardiovascular: RRR, +S1, S2, no m/g/r, equal pulses throughout Respiratory: CTABL, no w/r/r, normal WOB GI: BS+, NDNT, no masses noted, no organomegaly noted MSK: No e/c/c Neuro:alert to name, follows commands, hard of hearing Psyc: Appropriate interaction and affect, calm/cooperative   Data Reviewed: I have personally reviewed following labs and imaging studies.  CBC: Recent Labs  Lab 10/26/18 2029 10/27/18 0620 10/30/18 0606 10/31/18 0552  WBC 10.4 8.4 8.2 7.2  NEUTROABS 7.5  --  5.4 4.2  HGB 13.4 12.4 12.5 12.9  HCT 41.6 38.2 38.9 41.1  MCV 90.4 90.5 92.2 92.4  PLT 269 231 210 235   Basic Metabolic Panel: Recent Labs  Lab 10/26/18 2029 10/27/18 0620 10/28/18 0829 10/30/18 0606 10/31/18 0552  NA 128* 132* 139 135 136  K 3.2* 3.1* 3.6 3.0* 4.6  CL 90* 97* 105 100 106  CO2 26 27 22 24 22   GLUCOSE 105* 91 85 81 84  BUN 16 14 7* 5* 8  CREATININE 0.87 0.74 0.56 0.50 0.58  CALCIUM 9.3 8.8* 8.7* 8.5* 9.0  MG  --   --   --  1.7 1.9  PHOS  --   --   --  2.9 2.8   GFR: CrCl cannot be calculated (Unknown ideal weight.). Liver Function Tests: Recent Labs  Lab 10/26/18 2029 10/27/18 0620 10/30/18 0606 10/31/18 0552  AST 18 15  --   --   ALT 14 12  --   --   ALKPHOS 68 56  --   --   BILITOT 0.5 0.6  --   --   PROT 6.9 6.1*  --   --   ALBUMIN 3.9 3.3* 2.9* 3.1*   No results for input(s): LIPASE, AMYLASE in the last 168 hours. No results for input(s): AMMONIA in the last 168 hours. Coagulation Profile: No results for input(s): INR, PROTIME in the last 168 hours. Cardiac Enzymes:  No results for input(s): CKTOTAL, CKMB, CKMBINDEX, TROPONINI in the last 168 hours. BNP (last 3 results) No results for input(s): PROBNP in the last 8760 hours. HbA1C: No results for input(s): HGBA1C in the last 72 hours. CBG: No results for input(s): GLUCAP in the last 168 hours. Lipid Profile: No results for input(s): CHOL, HDL, LDLCALC, TRIG, CHOLHDL, LDLDIRECT in the last 72 hours. Thyroid Function Tests: No results for input(s): TSH, T4TOTAL, FREET4, T3FREE, THYROIDAB in the last 72 hours. Anemia Panel: No results for input(s): VITAMINB12, FOLATE, FERRITIN, TIBC, IRON, RETICCTPCT in the last 72 hours. Sepsis Labs: No results for input(s): PROCALCITON, LATICACIDVEN in the last 168 hours.  Recent  Results (from the past 240 hour(s))  SARS CORONAVIRUS 2 (TAT 6-24 HRS) Nasal Swab Aptima Multi Swab     Status: None   Collection Time: 10/26/18 11:36 PM   Specimen: Aptima Multi Swab; Nasal Swab  Result Value Ref Range Status   SARS Coronavirus 2 NEGATIVE NEGATIVE Final    Comment: (NOTE) SARS-CoV-2 target nucleic acids are NOT DETECTED. The SARS-CoV-2 RNA is generally detectable in upper and lower respiratory specimens during the acute phase of infection. Negative results do not preclude SARS-CoV-2 infection, do not rule out co-infections with other pathogens, and should not be used as the sole basis for treatment or other patient management decisions. Negative results must be combined with clinical observations, patient history, and epidemiological information. The expected result is Negative. Fact Sheet for Patients: HairSlick.nohttps://www.fda.gov/media/138098/download Fact Sheet for Healthcare Providers: quierodirigir.comhttps://www.fda.gov/media/138095/download This test is not yet approved or cleared by the [place] FDA and  has been authorized for detection and/or diagnosis of SARS-CoV-2 by FDA under an Emergency Use Authorization (EUA). This EUA will remain  in effect (meaning this test can be  used) for the duration of the COVID-19 declaration under Section 56 4(b)(1) of the Act, 21 U.S.C. section 360bbb-3(b)(1), unless the authorization is terminated or revoked sooner. Performed at [facility], [address] [place], [place] [address]   Culture, Urine     Status: Abnormal   Collection Time: 10/27/18  7:42 AM   Specimen: Urine, Random  Result Value Ref Range Status   Specimen Description   Final    URINE, RANDOM Performed at [facility], [address] [place], [place] [address]    Special Requests   Final    NONE Performed at [facility], [address] [place], [place] [address]    Culture MULTIPLE SPECIES PRESENT, SUGGEST RECOLLECTION (A)  Final   Report Status 10/28/2018 FINAL  Final  Culture, blood (routine x 2)     Status: None (Preliminary result)   Collection Time: 10/27/18  8:45 AM   Specimen: BLOOD  Result Value Ref Range Status   Specimen Description   Final    BLOOD RIGHT ANTECUBITAL Performed at [facility], [address] [place], [place] [address]    Special Requests   Final    BOTTLES DRAWN AEROBIC AND ANAEROBIC Blood Culture adequate volume Performed at [facility], [address] [place], [place] [address]    Culture   Final    NO GROWTH 4 DAYS Performed at [facility], [address] [place], [place] [address]    Report Status PENDING  Incomplete  Culture, blood (routine x 2)     Status: None (Preliminary result)   Collection Time: 10/27/18  8:47 AM   Specimen: BLOOD  Result Value Ref Range Status   Specimen Description   Final    BLOOD RIGHT ARM Performed at [facility], [address] [place], [place] [address]    Special Requests   Final    BOTTLES DRAWN AEROBIC ONLY Blood Culture results may not be optimal due to an inadequate volume of blood received in culture bottles Performed at [facility], [address] [place], [place] [address]    Culture   Final    NO GROWTH 4 DAYS Performed at [facility], [address] [place], [place] [address]    Report Status PENDING  Incomplete      Radiology Studies:  No results found.   Scheduled Meds: . aspirin EC  81 mg Oral Daily  . cephALEXin  250 mg Oral Q6H  . enoxaparin (LOVENOX) injection  30 mg Subcutaneous Q24H  . ketorolac  15 mg Intravenous Once  . multivitamin with minerals  1 tablet Oral Daily  . pantoprazole  40 mg Oral Daily  . sertraline  25 mg Oral Daily  . triamterene-hydrochlorothiazide  1 tablet Oral Daily   Continuous Infusions:   LOS: 4 days    Time spent: 60 minutes spent in the coordination of care today.   Teddy Spike, DO [facility] Pager [phone]  If 7PM-7AM, please contact night-coverage [email or web address] Password [email or web address] 10/31/2018, 1:26 PM

## 2018-10-31 NOTE — Progress Notes (Signed)
Occupational Therapy Treatment Patient Details Name: Lindsey Norris MRN: [id number] DOB: 04-Feb-1927 Today's Date: 10/31/2018    History of present illness 85 year old female was admitted with progressive weakness and inability to walk.  Dx'd with UTI.  PMH:  COPD, OA, hearing loss   OT comments  Pt progressing towards acute OT goals. Focus of session was bed mobility and simulated toilet transfer (EOB to recliner). Pt completed with +2 min-mod A. D/c plan remains appropriate.   Follow Up Recommendations  SNF    Equipment Recommendations  3 in 1 bedside commode    Recommendations for Other Services      Precautions / Restrictions Precautions Precautions: Fall Precaution Comments: Pt HOH, uses white board to communicate. Pt is very fearful of being left alone Restrictions Weight Bearing Restrictions: No       Mobility Bed Mobility Overal bed mobility: Needs Assistance Bed Mobility: Rolling;Sidelying to Sit Rolling: Min assist Sidelying to sit: Mod assist       General bed mobility comments: assist for trunk and legs. increased time. multimodal cueing.  Transfers Overall transfer level: Needs assistance Equipment used: Rolling walker (2 wheeled) Transfers: Sit to/from [employer]GI CorporationStand;Stand Pivot Transfers Sit to Stand: Min assist;+2 physical assistance;+2 safety/equipment Stand pivot transfers: Min assist;+2 physical assistance       General transfer comment: Assist to rise, stabilize, control descent. Multimodal cueing required.    Balance Overall balance assessment: Needs assistance   Sitting balance-Leahy Scale: Poor Sitting balance - Comments: reliant on BUE support to static sit EOB   Standing balance support: Bilateral upper extremity supported Standing balance-Leahy Scale: Poor Standing balance comment: Needs B UE support and +2 for safety.                           ADL either performed or assessed with clinical judgement   ADL Overall ADL's :  Needs assistance/impaired                         Toilet Transfer: Moderate assistance;+2 for physical assistance;RW;Stand-pivot             General ADL Comments: bed mobility and simulated toilet transfer, SPT EOB to recliner +2 assist     Vision       Perception     Praxis      Cognition Arousal/Alertness: Awake/alert Behavior During Therapy: Anxious;WFL for tasks assessed/performed;Agitated(alternating between) Overall Cognitive Status: History of cognitive impairments - at baseline                                 General Comments: hx of dementia        Exercises     Shoulder Instructions       General Comments      Pertinent Vitals/ Pain       Pain Assessment: Faces Faces Pain Scale: Hurts little more Pain Location: generalized Pain Intervention(s): Monitored during session  Home Living Family/patient expects to be discharged to:: Unsure Living Arrangements: Spouse/significant other                               Additional Comments: had to write most things down to ask pt. She and her husband both have walkers      Prior Functioning/Environment  Frequency  Min 2X/week        Progress Toward Goals  OT Goals(current goals can now be found in the care plan section)  Progress towards OT goals: Progressing toward goals  Acute Rehab OT Goals Patient Stated Goal: none stated agreeable to therapy OT Goal Formulation: Patient unable to participate in goal setting Time For Goal Achievement: 11/11/18 Potential to Achieve Goals: Fair ADL Goals Pt Will Perform Eating: with set-up;sitting Pt Will Perform Grooming: with set-up;sitting Pt Will Perform Upper Body Bathing: with min assist Pt Will Perform Upper Body Dressing: with min assist Pt Will Transfer to Toilet: with min assist;bedside commode;stand pivot transfer Additional ADL Goal #1: pt will perform bed mobility with min A in preparation for  adls Additional ADL Goal #2: pt will peform 2 sets of 10 AROM exercises to increase strength/endurance for adls  Plan Discharge plan remains appropriate    Co-evaluation                 AM-PAC OT "6 Clicks" Daily Activity     Outcome Measure   Help from another person eating meals?: A Lot Help from another person taking care of personal grooming?: A Lot Help from another person toileting, which includes using toliet, bedpan, or urinal?: Total Help from another person bathing (including washing, rinsing, drying)?: A Lot Help from another person to put on and taking off regular upper body clothing?: A Lot Help from another person to put on and taking off regular lower body clothing?: Total 6 Click Score: 10    End of Session Equipment Utilized During Treatment: Gait belt;Rolling walker  OT Visit Diagnosis: Unsteadiness on feet (R26.81);Muscle weakness (generalized) (M62.81)   Activity Tolerance Patient tolerated treatment well(fearful of falling)   Patient Left in chair;with call bell/phone within reach;with chair alarm set   Nurse Communication          Time: 5916-3846 OT Time Calculation (min): 22 min  Charges: OT General Charges $OT Visit: 1 Visit OT Treatments $Self Care/Home Management : 8-22 mins  Tyrone Schimke, OT Acute Rehabilitation Services Pager: [phone] Office: [phone]    Hortencia Pilar 10/31/2018, 12:57 PM

## 2018-10-31 NOTE — TOC Transition Note (Signed)
Transition of Care [facility]) - CM/SW Discharge Note   Patient Details  Name: Lindsey Norris MRN: [id number] Date of Birth: 05-21-1926  [facility]) CM/SW Contact:  Lia Hopping, [place] Phone Number: 10/31/2018, 2:13 PM   Clinical Narrative:    Patient going home with Home Health services-PT/OT Patient Son [facility] choices [facility]-staffing unavailable , [facility]-Staffing unavailable, [facility]-cannot provided services until Wednesday. [facility] will follow up this weekend. DME-3 IN1 Ordered   Final next level of care: Home w Home Health Services Barriers to Discharge: Insurance Authorization, SNF Pending bed offer   Patient Goals and CMS Choice Patient states their goals for this hospitalization and ongoing recovery are:: pt unable to goal-set- husband reports wanting pt to come home but feeling rehab would be helpful at this level of need CMS Medicare.gov Compare Post Acute Care list provided to:: Other (Comment Required)(husband) Choice offered to / list presented to : (will present to husband when bed offers made)  Discharge Placement                  Name of family member notified: Son-Arman Patient and family notified of of transfer: 10/31/18  Discharge Plan and Services In-house Referral: Clinical Social Work   Post Acute Care Choice: [place]          DME Arranged: (Patient has RW, and Wheelchair)         [place] Arranged: PT, OT [place] Agency: [facility] Date [place]: 10/31/18 Time [place]: 1256 Representative spoke with at [place]: [place] ([place]) Interventions     Readmission Risk Interventions No flowsheet data found.

## 2018-10-31 NOTE — Discharge Summary (Signed)
. Physician Discharge Summary  Lindsey Norris PRF:[id number] DOB: 1926-10-26 DOA: 10/26/2018  PCP: Gaspar Garbe, MD  Admit date: 10/26/2018 Discharge date: 10/31/2018  Admitted From: Home Disposition:  Discharged to home with [facility]  Recommendations for Outpatient Follow-up:  1. Follow up with PCP in 1-2 weeks 2. Please obtain BMP/CBC in one week   Discharge Condition: Stable  CODE STATUS: FULL   Brief/Interim Summary: 85 y.o.femalewith medical history significant ofCOPD, osteoarthritis, GERD, hypertension osteoporosis hearing loss, hyperlipidemia who was brought in today due to significant weakness from home where she lives with her husband secondary to significant progressive weakness and inability to walk. Patient has been unable to give adequate history due to poor hearing. She denied any fall. Denied any significant change in her diet. Patient was noted to be dehydrated with evidence of UTI. She could not even get out of her wheelchair. She had to be assisted by 2 people. She is therefore being admitted to the hospital with generalized weakness as well as UTI.  Discharge Diagnoses:  Principal Problem:   UTI (urinary tract infection) Active Problems:   Hyperlipemia   Hypertension   GERD   Weakness   Hypokalemia   Hyponatremia  UTI: - Presented with increased weakness and debility with UA findings worrisome for UTI - Blood and urine cultures are pending, obtained after pt was stated on abx - No presenting leukocytosis, afebrile, not septic - No leukocytosis - Blood cx neg - UCx w/ multiple species; let's continue rocephin through today and would change to PO abx (possible keflex in AM)     - Now on keflex through 11/01/2018  generalized weakness:  - Most likely secondary to UTI and dehydration with presenting hyponatremia - Continue IVF hydration as tolerated - Have consulted PT and OT, thus far recs for SNF - SW  consulted     - family declines SNF option; requesting HH; see below  hyperlipidemia: - no home statin; monitor  hypertension: - Avoiding diuretic given concern of dehydration - BP stable at this time; monitor  hypokalemia: - Replaced - holding diuretic for now - resolved     - low again, add K+     - resolved  hyponatremia: - Suspect related to presenting dehydration - Much improved overnight with IVF hydration - resolved  COPD: - Lungs clear. No audible wheezing. On minimal O2 support - q4prn nebs  Long conversation with son and spouse about discharge options. It is recommended that she go to SNF. Her son does not want this to happen. Her spouse is deferring to her son's wishes. Her son states "I shouldn't have to tell you COVID is a problem in the rehab facilities. You all advocate more for them than you do for the patient. I'm sure there is a financial benefit to you." He also stated that the patient had a bad interaction with a SNF in the past, and that his opinion was that it would be no benefit to her to go to another. "All they did was take her medicaid money." I discussed my role as a physician in the care of his mother and how I have no financial connection with any rehab facility. I discussed the concern with COVID. I discussed my concern with her being in a safe environment. I told him that it is my medical opinion that she should go to a SNF for a short time, but I can not force this option. He asked me to discuss this with his father.  I did. The father deferred to the son. The son wishes her to go home with [facility] The son is arranging for caregivers in the home.   Discharge Instructions   Allergies as of 10/31/2018      Reactions   Sulfonamide Derivatives    REACTION: vomiting      Medication List    STOP taking these medications   azelastine 0.1 % nasal spray Commonly known as: ASTELIN     TAKE these medications    acetaminophen 325 MG tablet Commonly known as: TYLENOL Take 2 tablets (650 mg total) by mouth every 6 (six) hours as needed for mild pain (or Fever >/= 101).   aspirin EC 81 MG tablet Take 81 mg by mouth daily.   cephALEXin 250 MG capsule Commonly known as: KEFLEX Take 1 capsule (250 mg total) by mouth every 6 (six) hours for 1 day.   EPINEPHrine 0.3 mg/0.3 mL Soaj injection Commonly known as: EpiPen 2-Pak Inject into thigh if needed for severe allergic reaction   esomeprazole 40 MG capsule Commonly known as: NEXIUM Take 40 mg by mouth 2 (two) times daily before a meal.   multivitamin capsule Take 1 capsule by mouth daily.   sertraline 25 MG tablet Commonly known as: ZOLOFT TAKE 1 TABLET BY MOUTH EVERY DAY   traMADol 50 MG tablet Commonly known as: ULTRAM Take 50 mg by mouth every 4 (four) hours as needed for moderate pain.   triamterene-hydrochlorothiazide 37.5-25 MG capsule Commonly known as: DYAZIDE Take 1 capsule by mouth daily.       Allergies  Allergen Reactions  . Sulfonamide Derivatives     REACTION: vomiting     Procedures/Studies: Dg Chest Port 1 View  Result Date: 10/26/2018 CLINICAL DATA:  Weakness EXAM: PORTABLE CHEST 1 VIEW COMPARISON:  None. FINDINGS: Heart size is mildly enlarged. Aortic calcifications are noted. There is a left basilar airspace opacity. No pneumothorax. There appears to be a trace left-sided pleural effusion. There appear to be acute left-sided rib fractures as well as old healed left-sided rib fractures. IMPRESSION: 1. Acute appearing fracture involving the sixth rib anterior laterally on the left. There are additional old healed left-sided rib fractures. 2. No pneumothorax. 3. Left basilar airspace opacity which may represent an infiltrate or atelectasis in combination with a trace to small left-sided pleural effusion. 4. Cardiomegaly. Electronically Signed   By: Katherine Mantle M.D.   On: 10/26/2018 23:08        Subjective: Agitated ON per nursing.   Discharge Exam: Vitals:   10/30/18 2118 10/31/18 0448  BP: 136/78 138/74  Pulse: 75 70  Resp: 16 16  Temp: 99 F (37.2 C) (!) 97.5 F (36.4 C)  SpO2: 95% 95%   Vitals:   10/30/18 0529 10/30/18 1345 10/30/18 2118 10/31/18 0448  BP: (!) 147/80 (!) 147/72 136/78 138/74  Pulse: 68 70 75 70  Resp: 16 16 16 16   Temp: 98.3 F (36.8 C) 97.8 F (36.6 C) 99 F (37.2 C) (!) 97.5 F (36.4 C)  TempSrc:  Oral Oral   SpO2: 99% 94% 95% 95%  Weight:        General:85 y.o.femaleresting in bed in NAD Eyes: PERRL, normal sclera ENMT: Nares patent w/o discharge, orophaynx clear, dentition normal, ears w/o discharge/lesions/ulcers Cardiovascular: RRR, +S1, S2, no m/g/r, equal pulses throughout Respiratory: CTABL, no w/r/r, normal WOB GI: BS+, NDNT, no masses noted, no organomegaly noted MSK: No e/c/c Neuro:alert to name, follows commands, hard of hearing Psyc: Appropriate interaction  and affect, calm/cooperative    The results of significant diagnostics from this hospitalization (including imaging, microbiology, ancillary and laboratory) are listed below for reference.     Microbiology: Recent Results (from the past 240 hour(s))  SARS CORONAVIRUS 2 (TAT 6-24 HRS) Nasal Swab Aptima Multi Swab     Status: None   Collection Time: 10/26/18 11:36 PM   Specimen: Aptima Multi Swab; Nasal Swab  Result Value Ref Range Status   SARS Coronavirus 2 NEGATIVE NEGATIVE Final    Comment: (NOTE) SARS-CoV-2 target nucleic acids are NOT DETECTED. The SARS-CoV-2 RNA is generally detectable in upper and lower respiratory specimens during the acute phase of infection. Negative results do not preclude SARS-CoV-2 infection, do not rule out co-infections with other pathogens, and should not be used as the sole basis for treatment or other patient management decisions. Negative results must be combined with clinical observations, patient history, and  epidemiological information. The expected result is Negative. Fact Sheet for Patients: HairSlick.nohttps://www.fda.gov/media/138098/download Fact Sheet for Healthcare Providers: quierodirigir.comhttps://www.fda.gov/media/138095/download This test is not yet approved or cleared by the [place] FDA and  has been authorized for detection and/or diagnosis of SARS-CoV-2 by FDA under an Emergency Use Authorization (EUA). This EUA will remain  in effect (meaning this test can be used) for the duration of the COVID-19 declaration under Section 56 4(b)(1) of the Act, 21 U.S.C. section 360bbb-3(b)(1), unless the authorization is terminated or revoked sooner. Performed at [facility], [address] [place], [place] [address]   Culture, Urine     Status: Abnormal   Collection Time: 10/27/18  7:42 AM   Specimen: Urine, Random  Result Value Ref Range Status   Specimen Description   Final    URINE, RANDOM Performed at [facility], [address] [place], [place] [address]    Special Requests   Final    NONE Performed at [facility], [address] [place], [place] [address]    Culture MULTIPLE SPECIES PRESENT, SUGGEST RECOLLECTION (A)  Final   Report Status 10/28/2018 FINAL  Final  Culture, blood (routine x 2)     Status: None (Preliminary result)   Collection Time: 10/27/18  8:45 AM   Specimen: BLOOD  Result Value Ref Range Status   Specimen Description   Final    BLOOD RIGHT ANTECUBITAL Performed at [facility], [address] [place], [place] [address]    Special Requests   Final    BOTTLES DRAWN AEROBIC AND ANAEROBIC Blood Culture adequate volume Performed at [facility], [address] [place], [place] [address]    Culture   Final    NO GROWTH 3 DAYS Performed at [facility], [address] [place], [place] [address]    Report Status PENDING  Incomplete  Culture, blood (routine x 2)     Status: None (Preliminary  result)   Collection Time: 10/27/18  8:47 AM   Specimen: BLOOD  Result Value Ref Range Status   Specimen Description   Final    BLOOD RIGHT ARM Performed at [facility], [address] [place], [place] [address]    Special Requests   Final    BOTTLES DRAWN AEROBIC ONLY Blood Culture results may not be optimal due to an inadequate volume of blood received in culture bottles Performed at [facility], [address] [place], [place] [address]    Culture   Final    NO  GROWTH 3 DAYS Performed at [facility], [address] [place], [place] [address]    Report Status PENDING  Incomplete     Labs: BNP (last 3 results) Recent Labs    10/26/18 2029  BNP 66.9   Basic Metabolic Panel: Recent Labs  Lab 10/26/18 2029 10/27/18 0620 10/28/18 0829 10/30/18 0606 10/31/18 0552  NA 128* 132* 139 135 136  K 3.2* 3.1* 3.6 3.0* 4.6  CL 90* 97* 105 100 106  CO2 26 27 22 24 22   GLUCOSE 105* 91 85 81 84  BUN 16 14 7* 5* 8  CREATININE 0.87 0.74 0.56 0.50 0.58  CALCIUM 9.3 8.8* 8.7* 8.5* 9.0  MG  --   --   --  1.7 1.9  PHOS  --   --   --  2.9 2.8   Liver Function Tests: Recent Labs  Lab 10/26/18 2029 10/27/18 0620 10/30/18 0606 10/31/18 0552  AST 18 15  --   --   ALT 14 12  --   --   ALKPHOS 68 56  --   --   BILITOT 0.5 0.6  --   --   PROT 6.9 6.1*  --   --   ALBUMIN 3.9 3.3* 2.9* 3.1*   No results for input(s): LIPASE, AMYLASE in the last 168 hours. No results for input(s): AMMONIA in the last 168 hours. CBC: Recent Labs  Lab 10/26/18 2029 10/27/18 0620 10/30/18 0606 10/31/18 0552  WBC 10.4 8.4 8.2 7.2  NEUTROABS 7.5  --  5.4 4.2  HGB 13.4 12.4 12.5 12.9  HCT 41.6 38.2 38.9 41.1  MCV 90.4 90.5 92.2 92.4  PLT 269 231 210 235   Cardiac Enzymes: No results for input(s): CKTOTAL, CKMB, CKMBINDEX, TROPONINI in the last 168 hours. BNP: Invalid input(s): POCBNP CBG: No results for input(s): GLUCAP in the last 168  hours. D-Dimer No results for input(s): DDIMER in the last 72 hours. Hgb A1c No results for input(s): HGBA1C in the last 72 hours. Lipid Profile No results for input(s): CHOL, HDL, LDLCALC, TRIG, CHOLHDL, LDLDIRECT in the last 72 hours. Thyroid function studies No results for input(s): TSH, T4TOTAL, T3FREE, THYROIDAB in the last 72 hours.  Invalid input(s): FREET3 Anemia work up No results for input(s): VITAMINB12, FOLATE, FERRITIN, TIBC, IRON, RETICCTPCT in the last 72 hours. Urinalysis    Component Value Date/Time   COLORURINE YELLOW 10/26/2018 2029   APPEARANCEUR HAZY (A) 10/26/2018 2029   LABSPEC 1.006 10/26/2018 2029   PHURINE 8.0 10/26/2018 2029   GLUCOSEU NEGATIVE 10/26/2018 2029   HGBUR NEGATIVE 10/26/2018 2029   BILIRUBINUR NEGATIVE 10/26/2018 2029   KETONESUR NEGATIVE 10/26/2018 2029   PROTEINUR NEGATIVE 10/26/2018 2029   UROBILINOGEN 0.2 08/12/2012 1116   NITRITE NEGATIVE 10/26/2018 2029   LEUKOCYTESUR LARGE (A) 10/26/2018 2029   Sepsis Labs Invalid input(s): PROCALCITONIN,  WBC,  LACTICIDVEN Microbiology Recent Results (from the past 240 hour(s))  SARS CORONAVIRUS 2 (TAT 6-24 HRS) Nasal Swab Aptima Multi Swab     Status: None   Collection Time: 10/26/18 11:36 PM   Specimen: Aptima Multi Swab; Nasal Swab  Result Value Ref Range Status   SARS Coronavirus 2 NEGATIVE NEGATIVE Final    Comment: (NOTE) SARS-CoV-2 target nucleic acids are NOT DETECTED. The SARS-CoV-2 RNA is generally detectable in upper and lower respiratory specimens during the acute phase of infection. Negative results do not preclude SARS-CoV-2 infection, do not rule out co-infections with other pathogens, and should not be used as  the sole basis for treatment or other patient management decisions. Negative results must be combined with clinical observations, patient history, and epidemiological information. The expected result is Negative. Fact Sheet for  Patients: SugarRoll.be Fact Sheet for Healthcare Providers: https://www.woods-mathews.com/ This test is not yet approved or cleared by the [place] FDA and  has been authorized for detection and/or diagnosis of SARS-CoV-2 by FDA under an Emergency Use Authorization (EUA). This EUA will remain  in effect (meaning this test can be used) for the duration of the COVID-19 declaration under Section 56 4(b)(1) of the Act, 21 U.S.C. section 360bbb-3(b)(1), unless the authorization is terminated or revoked sooner. Performed at [facility], [place] [address], [place], [place] [address]   Culture, Urine     Status: Abnormal   Collection Time: 10/27/18  7:42 AM   Specimen: Urine, Random  Result Value Ref Range Status   Specimen Description   Final    URINE, RANDOM Performed at [place] [address], [place], [place] [address]    Special Requests   Final    NONE Performed at [facility], [place] [address] [place], [place] [address]    Culture MULTIPLE SPECIES PRESENT, SUGGEST RECOLLECTION (A)  Final   Report Status 10/28/2018 FINAL  Final  Culture, blood (routine x 2)     Status: None (Preliminary result)   Collection Time: 10/27/18  8:45 AM   Specimen: BLOOD  Result Value Ref Range Status   Specimen Description   Final    BLOOD RIGHT ANTECUBITAL Performed at [facility], [place] [address] [place], [place] [address]    Special Requests   Final    BOTTLES DRAWN AEROBIC AND ANAEROBIC Blood Culture adequate volume Performed at [place] [address], [place], [place] [address]    Culture   Final    NO GROWTH 3 DAYS Performed at [facility], [place] [address] [place], [place] [address]    Report Status PENDING  Incomplete  Culture, blood (routine x 2)     Status: None (Preliminary result)   Collection Time: 10/27/18  8:47 AM   Specimen: BLOOD  Result Value  Ref Range Status   Specimen Description   Final    BLOOD RIGHT ARM Performed at [place] [address] [place], [place] [address]    Special Requests   Final    BOTTLES DRAWN AEROBIC ONLY Blood Culture results may not be optimal due to an inadequate volume of blood received in culture bottles Performed at [place] [address] [place], [place] [address]    Culture   Final    NO GROWTH 3 DAYS Performed at [facility], [place] [address], [place], [place] [address]    Report Status PENDING  Incomplete     Time coordinating discharge: 60 minutes  SIGNED:   Jonnie Finner, DO  [facility] 10/31/2018, 12:24 PM Pager   If 7PM-7AM, please contact night-coverage [email or web address] Password [email or web address]

## 2018-11-01 LAB — CULTURE, BLOOD (ROUTINE X 2)
Culture: NO GROWTH
Culture: NO GROWTH
Special Requests: ADEQUATE

## 2018-11-21 ENCOUNTER — Encounter (HOSPITAL_COMMUNITY): Payer: Self-pay | Admitting: Internal Medicine

## 2018-11-21 ENCOUNTER — Other Ambulatory Visit: Payer: Self-pay

## 2018-11-21 ENCOUNTER — Emergency Department (HOSPITAL_COMMUNITY): Payer: Medicare Other

## 2018-11-21 ENCOUNTER — Inpatient Hospital Stay (HOSPITAL_COMMUNITY)
Admission: EM | Admit: 2018-11-21 | Discharge: 2018-12-04 | DRG: 314 | Disposition: A | Discharge status: Hospice | Payer: Medicare Other | Attending: Internal Medicine | Admitting: Internal Medicine

## 2018-11-21 DIAGNOSIS — J189 Pneumonia, unspecified organism: Secondary | ICD-10-CM | POA: Diagnosis present

## 2018-11-21 DIAGNOSIS — D649 Anemia, unspecified: Secondary | ICD-10-CM | POA: Diagnosis present

## 2018-11-21 DIAGNOSIS — E876 Hypokalemia: Secondary | ICD-10-CM | POA: Diagnosis present

## 2018-11-21 DIAGNOSIS — Z87891 Personal history of nicotine dependence: Secondary | ICD-10-CM

## 2018-11-21 DIAGNOSIS — Z681 Body mass index (BMI) 19 or less, adult: Secondary | ICD-10-CM

## 2018-11-21 DIAGNOSIS — I959 Hypotension, unspecified: Secondary | ICD-10-CM | POA: Diagnosis present

## 2018-11-21 DIAGNOSIS — Z882 Allergy status to sulfonamides status: Secondary | ICD-10-CM

## 2018-11-21 DIAGNOSIS — N390 Urinary tract infection, site not specified: Secondary | ICD-10-CM | POA: Diagnosis present

## 2018-11-21 DIAGNOSIS — Z800 Family history of malignant neoplasm of digestive organs: Secondary | ICD-10-CM

## 2018-11-21 DIAGNOSIS — R17 Unspecified jaundice: Secondary | ICD-10-CM | POA: Diagnosis present

## 2018-11-21 DIAGNOSIS — I10 Essential (primary) hypertension: Secondary | ICD-10-CM | POA: Diagnosis present

## 2018-11-21 DIAGNOSIS — I480 Paroxysmal atrial fibrillation: Secondary | ICD-10-CM | POA: Diagnosis not present

## 2018-11-21 DIAGNOSIS — E785 Hyperlipidemia, unspecified: Secondary | ICD-10-CM | POA: Diagnosis present

## 2018-11-21 DIAGNOSIS — N3000 Acute cystitis without hematuria: Secondary | ICD-10-CM | POA: Diagnosis not present

## 2018-11-21 DIAGNOSIS — J440 Chronic obstructive pulmonary disease with acute lower respiratory infection: Secondary | ICD-10-CM | POA: Diagnosis present

## 2018-11-21 DIAGNOSIS — F03918 Unspecified dementia, unspecified severity, with other behavioral disturbance: Secondary | ICD-10-CM

## 2018-11-21 DIAGNOSIS — E861 Hypovolemia: Secondary | ICD-10-CM | POA: Diagnosis present

## 2018-11-21 DIAGNOSIS — Z20828 Contact with and (suspected) exposure to other viral communicable diseases: Secondary | ICD-10-CM | POA: Diagnosis present

## 2018-11-21 DIAGNOSIS — E860 Dehydration: Secondary | ICD-10-CM | POA: Diagnosis present

## 2018-11-21 DIAGNOSIS — E7800 Pure hypercholesterolemia, unspecified: Secondary | ICD-10-CM | POA: Diagnosis present

## 2018-11-21 DIAGNOSIS — R627 Adult failure to thrive: Secondary | ICD-10-CM | POA: Diagnosis present

## 2018-11-21 DIAGNOSIS — Z515 Encounter for palliative care: Secondary | ICD-10-CM

## 2018-11-21 DIAGNOSIS — B952 Enterococcus as the cause of diseases classified elsewhere: Secondary | ICD-10-CM | POA: Diagnosis present

## 2018-11-21 DIAGNOSIS — M1990 Unspecified osteoarthritis, unspecified site: Secondary | ICD-10-CM | POA: Diagnosis present

## 2018-11-21 DIAGNOSIS — M810 Age-related osteoporosis without current pathological fracture: Secondary | ICD-10-CM | POA: Diagnosis present

## 2018-11-21 DIAGNOSIS — N179 Acute kidney failure, unspecified: Secondary | ICD-10-CM | POA: Diagnosis present

## 2018-11-21 DIAGNOSIS — Z66 Do not resuscitate: Secondary | ICD-10-CM | POA: Diagnosis not present

## 2018-11-21 DIAGNOSIS — I700 Atherosclerosis of aorta: Secondary | ICD-10-CM | POA: Diagnosis present

## 2018-11-21 DIAGNOSIS — Z8049 Family history of malignant neoplasm of other genital organs: Secondary | ICD-10-CM

## 2018-11-21 DIAGNOSIS — Z833 Family history of diabetes mellitus: Secondary | ICD-10-CM

## 2018-11-21 DIAGNOSIS — E43 Unspecified severe protein-calorie malnutrition: Secondary | ICD-10-CM | POA: Diagnosis present

## 2018-11-21 DIAGNOSIS — F0391 Unspecified dementia with behavioral disturbance: Secondary | ICD-10-CM | POA: Diagnosis present

## 2018-11-21 DIAGNOSIS — Z825 Family history of asthma and other chronic lower respiratory diseases: Secondary | ICD-10-CM

## 2018-11-21 DIAGNOSIS — F329 Major depressive disorder, single episode, unspecified: Secondary | ICD-10-CM | POA: Diagnosis present

## 2018-11-21 DIAGNOSIS — Z8249 Family history of ischemic heart disease and other diseases of the circulatory system: Secondary | ICD-10-CM

## 2018-11-21 DIAGNOSIS — Z7189 Other specified counseling: Secondary | ICD-10-CM

## 2018-11-21 DIAGNOSIS — Z823 Family history of stroke: Secondary | ICD-10-CM

## 2018-11-21 DIAGNOSIS — H9190 Unspecified hearing loss, unspecified ear: Secondary | ICD-10-CM | POA: Diagnosis present

## 2018-11-21 DIAGNOSIS — E871 Hypo-osmolality and hyponatremia: Secondary | ICD-10-CM | POA: Diagnosis present

## 2018-11-21 DIAGNOSIS — R0602 Shortness of breath: Secondary | ICD-10-CM

## 2018-11-21 DIAGNOSIS — R64 Cachexia: Secondary | ICD-10-CM | POA: Diagnosis present

## 2018-11-21 DIAGNOSIS — K219 Gastro-esophageal reflux disease without esophagitis: Secondary | ICD-10-CM | POA: Diagnosis present

## 2018-11-21 DIAGNOSIS — I471 Supraventricular tachycardia: Secondary | ICD-10-CM | POA: Diagnosis not present

## 2018-11-21 LAB — LACTIC ACID, PLASMA
Lactic Acid, Venous: 1.4 mmol/L (ref 0.5–1.9)
Lactic Acid, Venous: 1 mmol/L (ref 0.5–1.9)

## 2018-11-21 LAB — URINALYSIS, MICROSCOPIC (REFLEX)
RBC / HPF: NONE SEEN RBC/hpf (ref 0–5)
Squamous Epithelial / HPF: NONE SEEN (ref 0–5)

## 2018-11-21 LAB — COMPREHENSIVE METABOLIC PANEL
ALT: 13 U/L (ref 0–44)
AST: 39 U/L (ref 15–41)
Albumin: 3.8 g/dL (ref 3.5–5.0)
Alkaline Phosphatase: 60 U/L (ref 38–126)
Anion gap: 11 (ref 5–15)
BUN: 20 mg/dL (ref 8–23)
CO2: 27 mmol/L (ref 22–32)
Calcium: 9.1 mg/dL (ref 8.9–10.3)
Chloride: 92 mmol/L — ABNORMAL LOW (ref 98–111)
Creatinine, Ser: 1.13 mg/dL — ABNORMAL HIGH (ref 0.44–1.00)
GFR calc Af Amer: 50 mL/min — ABNORMAL LOW (ref >60)
GFR calc non Af Amer: 43 mL/min — ABNORMAL LOW (ref >60)
Glucose, Bld: 101 mg/dL — ABNORMAL HIGH (ref 70–99)
Potassium: 4.3 mmol/L (ref 3.5–5.1)
Sodium: 130 mmol/L — ABNORMAL LOW (ref 135–145)
Total Bilirubin: 1.5 mg/dL — ABNORMAL HIGH (ref 0.3–1.2)
Total Protein: 6.6 g/dL (ref 6.5–8.1)

## 2018-11-21 LAB — URINALYSIS, ROUTINE W REFLEX MICROSCOPIC
Bilirubin Urine: NEGATIVE
Glucose, UA: NEGATIVE mg/dL
Hgb urine dipstick: NEGATIVE
Ketones, ur: NEGATIVE mg/dL
Nitrite: NEGATIVE
Protein, ur: NEGATIVE mg/dL
Specific Gravity, Urine: 1.01 (ref 1.005–1.030)
pH: 7 (ref 5.0–8.0)

## 2018-11-21 LAB — CBC WITH DIFFERENTIAL/PLATELET
Abs Immature Granulocytes: 0.03 10*3/uL (ref 0.00–0.07)
Basophils Absolute: 0.1 10*3/uL (ref 0.0–0.1)
Basophils Relative: 1 %
Eosinophils Absolute: 0.1 10*3/uL (ref 0.0–0.5)
Eosinophils Relative: 1 %
HCT: 40.8 % (ref 36.0–46.0)
Hemoglobin: 13.5 g/dL (ref 12.0–15.0)
Immature Granulocytes: 0 %
Lymphocytes Relative: 28 %
Lymphs Abs: 2.8 10*3/uL (ref 0.7–4.0)
MCH: 29.9 pg (ref 26.0–34.0)
MCHC: 33.1 g/dL (ref 30.0–36.0)
MCV: 90.5 fL (ref 80.0–100.0)
Monocytes Absolute: 0.9 10*3/uL (ref 0.1–1.0)
Monocytes Relative: 9 %
Neutro Abs: 6.1 10*3/uL (ref 1.7–7.7)
Neutrophils Relative %: 61 %
Platelets: 305 10*3/uL (ref 150–400)
RBC: 4.51 MIL/uL (ref 3.87–5.11)
RDW: 15.1 % (ref 11.5–15.5)
WBC: 10 10*3/uL (ref 4.0–10.5)
nRBC: 0 % (ref 0.0–0.2)

## 2018-11-21 MED ORDER — SODIUM CHLORIDE 0.9 % IV SOLN
1.0000 g | Freq: Once | INTRAVENOUS | Status: AC
  Administered 2018-11-21: 1 g via INTRAVENOUS
  Filled 2018-11-21: qty 10

## 2018-11-21 MED ORDER — LORAZEPAM 2 MG/ML IJ SOLN
1.0000 mg | Freq: Once | INTRAMUSCULAR | Status: AC
  Administered 2018-11-21: 1 mg via INTRAVENOUS
  Filled 2018-11-21: qty 1

## 2018-11-21 MED ORDER — SODIUM CHLORIDE 0.9 % IV SOLN
500.0000 mg | Freq: Once | INTRAVENOUS | Status: AC
  Administered 2018-11-21: 22:00:00 500 mg via INTRAVENOUS
  Filled 2018-11-21: qty 500

## 2018-11-21 MED ORDER — LORAZEPAM 2 MG/ML IJ SOLN: 1.0000 mg | Freq: Once | INTRAMUSCULAR | Status: DC | PRN

## 2018-11-21 MED ORDER — SODIUM CHLORIDE 0.9 % IV BOLUS
1000.0000 mL | Freq: Once | INTRAVENOUS | Status: AC
  Administered 2018-11-21: 1000 mL via INTRAVENOUS

## 2018-11-21 NOTE — ED Provider Notes (Signed)
[facility] DEPT Provider Note   CSN: [id number] Arrival date & time: 11/21/18  1645     History   Chief Complaint Chief Complaint  Patient presents with  . Hypotension    HPI Lindsey Norris is a 85 y.o. female history of COPD, hypertension, here presenting with hypotension, state custody.  Patient is from home and demented and unable to give much history.  Patient lives at home with her husband.  She has home health and physical therapy that goes to visit her.  Home health went yesterday and she was noted to be hypotensive with a blood pressure of 80/60.  EMS was called but has been refused transport.  Today the APS worker went out to evaluate the home and the decision was to get emergency court order and now the state has custody.      The history is provided by medical records (APS worker).  Level V caveat- dementia   Past Medical History:  Diagnosis Date  . Arthritis   . Chronic gastritis   . Colon polyp    dimnutive 18mm  . COPD (chronic obstructive pulmonary disease) ([place])   . Duodenitis without hemorrhage   . GERD (gastroesophageal reflux disease)   . Hearing loss   . Hemorrhoids, internal    & external  . Hypercholesteremia   . Hypertension   . Osteoporosis 2013/2015/2017   2013 T score -2.5, 2015 T score - 2.4, 2017 T score -2.3 improved at both hips from prior study  . Sclerosis of the skin    LICHENS SCLEROSIS  . Urticaria     Patient Active Problem List   Diagnosis Date Noted  . Weakness 10/27/2018  . Hypokalemia 10/27/2018  . UTI (urinary tract infection) 10/27/2018  . Hyponatremia 10/27/2018  . Seizures ([place]) 04/25/2017  . Syncope 04/25/2017  . Altered mental status 04/25/2017  . Arthritis   . Seasonal and perennial allergic rhinitis 05/09/2010  . Hyperlipemia 06/11/2007  . Hypertension 06/11/2007  . HEMORRHOIDS 06/11/2007  . COPD, MILD 06/11/2007  . OSTEOARTHRITIS 06/11/2007  . Personal history of colonic  polyps 01/15/2007  . Asthma, mild intermittent, well-controlled 01/15/2007  . GERD 01/15/2007  . GASTRITIS, CHRONIC 04/22/2001    Past Surgical History:  Procedure Laterality Date  . APPENDECTOMY    . CATARACT EXTRACTION     bilateral x 2  . COLONOSCOPY    . DILATION AND CURETTAGE OF UTERUS  1969  . ESOPHAGOGASTRODUODENOSCOPY    . NOSE SURGERY     DEVIATED SEPTUM REPAIR  . REFRACTIVE SURGERY     LASER EYE SURGERY X2/ 2010 AND 2011     OB History    Gravida  0   Para      Term      Preterm      AB      Living        SAB      TAB      Ectopic      Multiple      Live Births               Home Medications    Prior to Admission medications   Medication Sig Start Date End Date Taking? Authorizing Provider  EPINEPHrine (EPIPEN 2-PAK) 0.3 mg/0.3 mL IJ SOAJ injection Inject into thigh if needed for severe allergic reaction 02/24/14  Yes Young, Clinton D, MD  esomeprazole (NEXIUM) 40 MG capsule Take 40 mg by mouth 2 (two) times daily before a meal.  Yes [provider]  Multiple Vitamin (MULTIVITAMIN) capsule Take 1 capsule by mouth daily.     Yes [provider]  sertraline (ZOLOFT) 25 MG tablet TAKE 1 TABLET BY MOUTH EVERY DAY Patient taking differently: Take 25 mg by mouth daily.  10/23/18  Yes Penumalli, Glenford Bayley, MD  traMADol (ULTRAM) 50 MG tablet Take 50 mg by mouth every 6 (six) hours as needed for moderate pain.    Yes [provider]  triamterene-hydrochlorothiazide (DYAZIDE) 37.5-25 MG per capsule Take 1 capsule by mouth daily.    Yes [provider]  acetaminophen (TYLENOL) 325 MG tablet Take 2 tablets (650 mg total) by mouth every 6 (six) hours as needed for mild pain (or Fever >/= 101). Patient not taking: Reported on 11/21/2018 04/27/17   Glade Lloyd, MD    Family History Family History  Problem Relation Age of Onset  . Hypertension Mother   . Asthma Mother   . Stroke Mother   . Heart disease Father   .  Heart attack Father   . Uterine cancer Paternal Aunt   . Diabetes Maternal Grandfather   . Stomach cancer Paternal Grandfather   . Colon cancer Neg Hx   . Allergic rhinitis Neg Hx   . Angioedema Neg Hx   . Eczema Neg Hx   . Immunodeficiency Neg Hx   . Urticaria Neg Hx     Social History Social History   Tobacco Use  . Smoking status: Former Smoker    Packs/day: 0.10    Years: 4.00    Pack years: 0.40    Types: Cigarettes    Quit date: 02/27/1956    Years since quitting: 62.7  . Smokeless tobacco: Never Used  Substance Use Topics  . Alcohol use: No    Alcohol/week: 0.0 standard drinks  . Drug use: No     Allergies   Sulfonamide derivatives   Review of Systems Review of Systems  Neurological: Positive for weakness.  All other systems reviewed and are negative.    Physical Exam Updated Vital Signs BP 97/60   Pulse 80   Temp 98.9 F (37.2 C) (Rectal)   Resp 15   Ht 5\' 3"  (1.6 m)   Wt 43.4 kg   SpO2 100%   BMI 16.95 kg/m   Physical Exam Vitals signs and nursing note reviewed.  Constitutional:      Comments: Demented, agitated   HENT:     Head: Normocephalic.     Nose: Nose normal.     Mouth/Throat:     Mouth: Mucous membranes are moist.  Eyes:     Extraocular Movements: Extraocular movements intact.     Pupils: Pupils are equal, round, and reactive to light.  Neck:     Musculoskeletal: Normal range of motion.  Cardiovascular:     Rate and Rhythm: Normal rate and regular rhythm.     Pulses: Normal pulses.  Pulmonary:     Effort: Pulmonary effort is normal.     Breath sounds: Normal breath sounds.  Abdominal:     General: Abdomen is flat.     Palpations: Abdomen is soft.  Musculoskeletal: Normal range of motion.  Skin:    General: Skin is warm.     Capillary Refill: Capillary refill takes less than 2 seconds.  Neurological:     General: No focal deficit present.     Comments: Demented, moving all extremities, no focal deficits   Psychiatric:         Mood  and Affect: Mood normal.      ED Treatments / Results  Labs (all labs ordered are listed, but only abnormal results are displayed) Labs Reviewed  COMPREHENSIVE METABOLIC PANEL - Abnormal; Notable for the following components:      Result Value   Sodium 130 (*)    Chloride 92 (*)    Glucose, Bld 101 (*)    Creatinine, Ser 1.13 (*)    Total Bilirubin 1.5 (*)    GFR calc non Af Amer 43 (*)    GFR calc Af Amer 50 (*)    All other components within normal limits  URINE CULTURE  CULTURE, BLOOD (ROUTINE X 2)  CULTURE, BLOOD (ROUTINE X 2)  SARS CORONAVIRUS 2 (HOSPITAL ORDER, PERFORMED IN [facility])  CBC WITH DIFFERENTIAL/PLATELET  LACTIC ACID, PLASMA  URINALYSIS, ROUTINE W REFLEX MICROSCOPIC  LACTIC ACID, PLASMA    EKG EKG Interpretation  Date/Time:  Friday November 21 2018 17:18:26 EDT Ventricular Rate:  75 PR Interval:    QRS Duration: 89 QT Interval:  396 QTC Calculation: 443 R Axis:   52 Text Interpretation:  Sinus rhythm Multiple premature complexes, vent & supraven Borderline T abnormalities, inferior leads No significant change since last tracing Confirmed by Richardean CanalYao, David H ([id number]) on 11/21/2018 5:28:44 PM   Radiology Dg Chest Port 1 View  Result Date: 11/21/2018 CLINICAL DATA:  Weakness. EXAM: PORTABLE CHEST 1 VIEW COMPARISON:  Radiograph of October 26, 2018. FINDINGS: Stable cardiomediastinal silhouette. Atherosclerosis of thoracic aorta is noted. No pneumothorax is noted. No pleural effusion is noted. Right lung is clear. Mild left basilar atelectasis or infiltrate is noted. Bony thorax is unremarkable. IMPRESSION: Stable mild left basilar atelectasis or infiltrate. Aortic Atherosclerosis (ICD10-I70.0). Electronically Signed   By: Lupita RaiderJames  Green Jr M.D.   On: 11/21/2018 18:35    Procedures Procedures (including critical care time)  Medications Ordered in ED Medications  cefTRIAXone (ROCEPHIN) 1 g in sodium chloride 0.9 % 100 mL IVPB (has  no administration in time range)  azithromycin (ZITHROMAX) 500 mg in sodium chloride 0.9 % 250 mL IVPB (has no administration in time range)  LORazepam (ATIVAN) injection 1 mg (has no administration in time range)  sodium chloride 0.9 % bolus 1,000 mL (1,000 mLs Intravenous New Bag/Given 11/21/18 1835)  LORazepam (ATIVAN) injection 1 mg (1 mg Intravenous Given 11/21/18 1826)     Initial Impression / Assessment and Plan / ED Course  I have reviewed the triage vital signs and the nursing notes.  Pertinent labs & imaging results that were available during my care of the patient were reviewed by me and considered in my medical decision making (see chart for details).       Lindsey Norris is a 85 y.o. female history of dementia here presenting with hypotension, custody issue.  Per the APS worker, patient is now under state custody.  Patient demented and unable to give me any history. Blood pressure was 80/60 yesterday and was 97/60 in the ED. Will do sepsis workup and consult case management, social work. Will need admission   9:57 PM Labs unremarkable. UA + UTI. CXR showed possible L pneumonia. Given IV abx. BP up to 110/67 after IV bolus. Hospitalist to admit.    Final Clinical Impressions(s) / ED Diagnoses   Final diagnoses:  None    ED Discharge Orders    None       Charlynne PanderYao, David Hsienta, MD 11/21/18 2158

## 2018-11-21 NOTE — TOC Initial Note (Addendum)
Transition of Care [facility]) - Initial/Assessment Note    Patient Details  Name: Lindsey Norris MRN: [id number] Date of Birth: 12/03/1926  [facility]) CM/SW Contact:    Erenest Rasher, RN Phone Number: [phone] 11/21/2018, 6:05 PM  Clinical Narrative:                 Spoke to [place], Samule Ohm 336 [phone]. States [facility] APS, Legal Guardian, CSW, Sharene Skeans,  # [phone] is the contact to follow up to arrange SNF and other patient needs. Pt was living in home with husband and state has taken over her care. Will need PT/OT recommendation.    Contacted [facility] to make aware of hospitalization.    Expected Discharge Plan: [place] Barriers to Discharge: Continued Medical Work up   Patient Goals and CMS Choice Patient states their goals for this hospitalization and ongoing recovery are:: pt is currently under [facility] Interim [employer].gov Compare Post Acute Care list provided to:: Legal Guardian Choice offered to / list presented to : [place] / Guardian  Expected Discharge Plan and Services Expected Discharge Plan: [place] In-house Referral: Clinical Social Work Discharge Planning Services: CM Consult Post Acute Care Choice: [place] Living arrangements for the past 2 months: [place]: [facility] Date [place]: 11/21/18 Time [place]: [phone] Representative spoke with at [place]: [employer]  Prior Living Arrangements/Services Living arrangements for the past 2 months: [place] with:: Spouse Patient language and need for interpreter reviewed:: Yes Do you feel safe going back to the place where you live?: No   pt is under [facility] and will need placement at SNF  Need for Family Participation in Patient Care: Yes  (Comment) Care giver support system in place?: Yes (comment)   Criminal Activity/Legal Involvement Pertinent to Current Situation/Hospitalization: No - Comment as needed  Activities of Daily Living      Permission Sought/Granted Permission sought to share information with : Case Manager, Guardian, Other (comment)    Share Information with NAME: Sharene Skeans  Permission granted to share info w AGENCY: SNF  Permission granted to share info w Relationship: [place] granted to share info w Contact Information: [phone]  Emotional Assessment       Orientation: : Oriented to Self      Admission diagnosis:  Elderly Neglect  Patient Active Problem List   Diagnosis Date Noted  . Weakness 10/27/2018  . Hypokalemia 10/27/2018  . UTI (urinary tract infection) 10/27/2018  . Hyponatremia 10/27/2018  . Seizures ([place]) 04/25/2017  . Syncope 04/25/2017  . Altered mental status 04/25/2017  . Arthritis   . Seasonal and perennial allergic rhinitis 05/09/2010  . Hyperlipemia 06/11/2007  . Hypertension 06/11/2007  . HEMORRHOIDS 06/11/2007  . COPD, MILD 06/11/2007  . OSTEOARTHRITIS 06/11/2007  . Personal history of colonic polyps 01/15/2007  . Asthma, mild intermittent, well-controlled 01/15/2007  . GERD 01/15/2007  . GASTRITIS, CHRONIC 04/22/2001   PCP:  Haywood Pao, MD Pharmacy:   [id number] [address], [place] [address] Phone: [phone] Fax: [phone]     Social Determinants of Health (SDOH) Interventions  Readmission Risk Interventions No flowsheet data found.

## 2018-11-21 NOTE — Progress Notes (Addendum)
Consult request has been received. CSW attempting to follow up at present time.  CSW spoke with the [facility] RN CM who stated APS is to be followed up with for placement purposes by the [facility] CM/CSW C.C Dept.  CSW will continue to follow for D/C needs.  Alphonse Guild. Tatia Petrucci, LCSW, LCAS, CSI Transitions of Care Clinical Social Worker Care Coordination Department Ph: [phone]

## 2018-11-21 NOTE — H&P (Signed)
History and Physical    Lindsey Rosebushlizabeth A Schlotter ZOX:[id number] DOB: [birth year] DOA: 11/21/2018  PCP: Gaspar Garbeisovec, Richard W, MD  Patient coming from: Home.  Chief Complaint: Low blood pressure.  HPI: Lindsey Norris is a 85 y.o. female with history of hypertension, hearing loss possible dementia recently admitted for UTI 3 weeks ago was brought to the ER after patient was found to be hypotensive.  Per report provided by the ER physician as patient is confused and at this time patient's care has been taken over by state since patient's husband was refusing to transfer patient to the ER patient was found to be hypotensive by patient's home health aide and EMS was called yesterday but patient has been refused transfer.  Again today patient was visited by the home health aide again found to be hypotensive at this time state has taken the custody of patient's care and patient was transferred to ER.  ED Course: In the ER patient was hypotensive with a blood pressure in the 80s was given fluid bolus.  Labs show increased creatinine from baseline of 1.48 3 weeks ago weights around 1.1 with sodium of 130 UA concerning for UTI and chest x-ray showing some infiltrates.  Blood cultures urine cultures were obtained for sepsis work-up and started on empiric antibiotics.  COVID-19 test was negative.  WBC count was 10 and hemoglobin was 13.5.  EKG shows normal sinus rhythm.  Review of Systems: As per HPI, rest all negative.   Past Medical History:  Diagnosis Date  . Arthritis   . Chronic gastritis   . Colon polyp    dimnutive 2mm  . COPD (chronic obstructive pulmonary disease) ([facility])   . Duodenitis without hemorrhage   . GERD (gastroesophageal reflux disease)   . Hearing loss   . Hemorrhoids, internal    & external  . Hypercholesteremia   . Hypertension   . Osteoporosis 2013/2015/2017   2013 T score -2.5, 2015 T score - 2.4, 2017 T score -2.3 improved at both hips from prior study  . Sclerosis of the skin     LICHENS SCLEROSIS  . Urticaria     Past Surgical History:  Procedure Laterality Date  . APPENDECTOMY    . CATARACT EXTRACTION     bilateral x 2  . COLONOSCOPY    . DILATION AND CURETTAGE OF UTERUS  1969  . ESOPHAGOGASTRODUODENOSCOPY    . NOSE SURGERY     DEVIATED SEPTUM REPAIR  . REFRACTIVE SURGERY     LASER EYE SURGERY X2/ 2010 AND 2011     reports that she quit smoking about 62 years ago. Her smoking use included cigarettes. She has a 0.40 pack-year smoking history. She has never used smokeless tobacco. She reports that she does not drink alcohol or use drugs.  Allergies  Allergen Reactions  . Sulfonamide Derivatives     REACTION: vomiting    Family History  Problem Relation Age of Onset  . Hypertension Mother   . Asthma Mother   . Stroke Mother   . Heart disease Father   . Heart attack Father   . Uterine cancer Paternal Aunt   . Diabetes Maternal Grandfather   . Stomach cancer Paternal Grandfather   . Colon cancer Neg Hx   . Allergic rhinitis Neg Hx   . Angioedema Neg Hx   . Eczema Neg Hx   . Immunodeficiency Neg Hx   . Urticaria Neg Hx     Prior to Admission medications   Medication Sig  Start Date End Date Taking? Authorizing Provider  EPINEPHrine (EPIPEN 2-PAK) 0.3 mg/0.3 mL IJ SOAJ injection Inject into thigh if needed for severe allergic reaction 02/24/14  Yes Young, Clinton D, MD  esomeprazole (NEXIUM) 40 MG capsule Take 40 mg by mouth 2 (two) times daily before a meal.   Yes [provider]  Multiple Vitamin (MULTIVITAMIN) capsule Take 1 capsule by mouth daily.     Yes [provider]  sertraline (ZOLOFT) 25 MG tablet TAKE 1 TABLET BY MOUTH EVERY DAY Patient taking differently: Take 25 mg by mouth daily.  10/23/18  Yes Penumalli, Earlean Polka, MD  traMADol (ULTRAM) 50 MG tablet Take 50 mg by mouth every 6 (six) hours as needed for moderate pain.    Yes [provider]  triamterene-hydrochlorothiazide (DYAZIDE) 37.5-25 MG per capsule  Take 1 capsule by mouth daily.    Yes [provider]  acetaminophen (TYLENOL) 325 MG tablet Take 2 tablets (650 mg total) by mouth every 6 (six) hours as needed for mild pain (or Fever >/= 101). Patient not taking: Reported on 11/21/2018 04/27/17   Aline August, MD    Physical Exam: Constitutional: Moderately built and nourished. Vitals:   11/21/18 2130 11/21/18 2200 11/21/18 2215 11/21/18 2230  BP: 110/67 107/69  113/69  Pulse: 66  66 74  Resp: 17 20 17 16   Temp:      TempSrc:      SpO2: 98%  99% 98%  Weight:      Height:       Eyes: Anicteric no pallor. ENMT: No discharge from the ears eyes nose or mouth. Neck: No mass felt.  No neck rigidity. Respiratory: No rhonchi or crepitations. Cardiovascular: S1-S2 heard. Abdomen: Soft nontender bowel sounds present. Musculoskeletal: No edema. Skin: No rash. Neurologic: Patient is hard of hearing and has also possible dementia and does not communicate but moves all extremities.  Pupils are equal and reacting to light. Psychiatric: Patient noncommunicative.   Labs on Admission: I have personally reviewed following labs and imaging studies  CBC: Recent Labs  Lab 11/21/18 1747  WBC 10.0  NEUTROABS 6.1  HGB 13.5  HCT 40.8  MCV 90.5  PLT 132   Basic Metabolic Panel: Recent Labs  Lab 11/21/18 1747  NA 130*  K 4.3  CL 92*  CO2 27  GLUCOSE 101*  BUN 20  CREATININE 1.13*  CALCIUM 9.1   GFR: Estimated Creatinine Clearance: 22.7 mL/min (A) (by C-G formula based on SCr of 1.13 mg/dL (H)). Liver Function Tests: Recent Labs  Lab 11/21/18 1747  AST 39  ALT 13  ALKPHOS 60  BILITOT 1.5*  PROT 6.6  ALBUMIN 3.8   No results for input(s): LIPASE, AMYLASE in the last 168 hours. No results for input(s): AMMONIA in the last 168 hours. Coagulation Profile: No results for input(s): INR, PROTIME in the last 168 hours. Cardiac Enzymes: No results for input(s): CKTOTAL, CKMB, CKMBINDEX, TROPONINI in the last 168 hours.  BNP (last 3 results) No results for input(s): PROBNP in the last 8760 hours. HbA1C: No results for input(s): HGBA1C in the last 72 hours. CBG: No results for input(s): GLUCAP in the last 168 hours. Lipid Profile: No results for input(s): CHOL, HDL, LDLCALC, TRIG, CHOLHDL, LDLDIRECT in the last 72 hours. Thyroid Function Tests: No results for input(s): TSH, T4TOTAL, FREET4, T3FREE, THYROIDAB in the last 72 hours. Anemia Panel: No results for input(s): VITAMINB12, FOLATE, FERRITIN, TIBC, IRON, RETICCTPCT in the last 72 hours. Urine analysis:  Component Value Date/Time   COLORURINE YELLOW 11/21/2018 2028   APPEARANCEUR CLEAR 11/21/2018 2028   LABSPEC 1.010 11/21/2018 2028   PHURINE 7.0 11/21/2018 2028   GLUCOSEU NEGATIVE 11/21/2018 2028   HGBUR NEGATIVE 11/21/2018 2028   BILIRUBINUR NEGATIVE 11/21/2018 2028   KETONESUR NEGATIVE 11/21/2018 2028   PROTEINUR NEGATIVE 11/21/2018 2028   UROBILINOGEN 0.2 08/12/2012 1116   NITRITE NEGATIVE 11/21/2018 2028   LEUKOCYTESUR LARGE (A) 11/21/2018 2028   Sepsis Labs: @LABRCNTIP (procalcitonin:4,lacticidven:4) )No results found for this or any previous visit (from the past 240 hour(s)).   Radiological Exams on Admission: Dg Chest Port 1 View  Result Date: 11/21/2018 CLINICAL DATA:  Weakness. EXAM: PORTABLE CHEST 1 VIEW COMPARISON:  Radiograph of October 26, 2018. FINDINGS: Stable cardiomediastinal silhouette. Atherosclerosis of thoracic aorta is noted. No pneumothorax is noted. No pleural effusion is noted. Right lung is clear. Mild left basilar atelectasis or infiltrate is noted. Bony thorax is unremarkable. IMPRESSION: Stable mild left basilar atelectasis or infiltrate. Aortic Atherosclerosis (ICD10-I70.0). Electronically Signed   By: October 28, 2018 M.D.   On: 11/21/2018 18:35    EKG: Independently reviewed.  Normal sinus rhythm.  Assessment/Plan Principal Problem:   Hypotension Active Problems:   Hypertension   UTI (urinary tract  infection)    1. Hypotension likely could be from dehydration.  Does not appear to be septic.  However given that patient has UTI and possible pneumonia patient has been placed on empiric antibiotics and follow cultures.  Gently hydrate. 2. Hyponatremia and acute renal failure likely could be from dehydration gently hydrate recheck metabolic panel. 3. History of hypertension hold hydrochlorothiazide due to low normal blood pressure and dehydration and renal failure. 4. History of depression on Zoloft.  Patient is presently under states custody.   DVT prophylaxis: Lovenox. Code Status: Full code.   Family Communication: Patient is presently under states custody. Disposition Plan: To be determined. Consults called: 11/23/2018. Admission status: Observation.   Child psychotherapist MD [facility] Pager [phone].  If 7PM-7AM, please contact night-coverage [email or web address] Password [facility]  11/21/2018, 11:17 PM

## 2018-11-21 NOTE — ED Triage Notes (Signed)
Per EMS, patient from home, called by APS who states they had an emergency court order for patient to be removed from the house and evaluated. States home health had BP of 80/60 yesterday but husband was adamant patient not be seen. Hx dementia. Patient has no complaints. Ambulatory with walker.  APS representative: [place] [phone]  BP 100/76 HR 80 CBG 126

## 2018-11-22 ENCOUNTER — Other Ambulatory Visit: Payer: Self-pay

## 2018-11-22 DIAGNOSIS — E785 Hyperlipidemia, unspecified: Secondary | ICD-10-CM | POA: Diagnosis present

## 2018-11-22 DIAGNOSIS — E871 Hypo-osmolality and hyponatremia: Secondary | ICD-10-CM | POA: Diagnosis present

## 2018-11-22 DIAGNOSIS — Z7189 Other specified counseling: Secondary | ICD-10-CM | POA: Diagnosis not present

## 2018-11-22 DIAGNOSIS — Z515 Encounter for palliative care: Secondary | ICD-10-CM | POA: Diagnosis not present

## 2018-11-22 DIAGNOSIS — R17 Unspecified jaundice: Secondary | ICD-10-CM | POA: Diagnosis present

## 2018-11-22 DIAGNOSIS — F0391 Unspecified dementia with behavioral disturbance: Secondary | ICD-10-CM | POA: Diagnosis present

## 2018-11-22 DIAGNOSIS — D649 Anemia, unspecified: Secondary | ICD-10-CM

## 2018-11-22 DIAGNOSIS — N179 Acute kidney failure, unspecified: Secondary | ICD-10-CM | POA: Diagnosis present

## 2018-11-22 DIAGNOSIS — N3000 Acute cystitis without hematuria: Secondary | ICD-10-CM | POA: Diagnosis present

## 2018-11-22 DIAGNOSIS — Z8249 Family history of ischemic heart disease and other diseases of the circulatory system: Secondary | ICD-10-CM | POA: Diagnosis not present

## 2018-11-22 DIAGNOSIS — H9190 Unspecified hearing loss, unspecified ear: Secondary | ICD-10-CM | POA: Diagnosis present

## 2018-11-22 DIAGNOSIS — J181 Lobar pneumonia, unspecified organism: Secondary | ICD-10-CM | POA: Diagnosis not present

## 2018-11-22 DIAGNOSIS — I10 Essential (primary) hypertension: Secondary | ICD-10-CM

## 2018-11-22 DIAGNOSIS — Z825 Family history of asthma and other chronic lower respiratory diseases: Secondary | ICD-10-CM | POA: Diagnosis not present

## 2018-11-22 DIAGNOSIS — I959 Hypotension, unspecified: Secondary | ICD-10-CM | POA: Diagnosis present

## 2018-11-22 DIAGNOSIS — N390 Urinary tract infection, site not specified: Secondary | ICD-10-CM | POA: Diagnosis present

## 2018-11-22 DIAGNOSIS — E861 Hypovolemia: Secondary | ICD-10-CM | POA: Diagnosis present

## 2018-11-22 DIAGNOSIS — K219 Gastro-esophageal reflux disease without esophagitis: Secondary | ICD-10-CM | POA: Diagnosis present

## 2018-11-22 DIAGNOSIS — R64 Cachexia: Secondary | ICD-10-CM | POA: Diagnosis present

## 2018-11-22 DIAGNOSIS — Z681 Body mass index (BMI) 19 or less, adult: Secondary | ICD-10-CM | POA: Diagnosis not present

## 2018-11-22 DIAGNOSIS — M1990 Unspecified osteoarthritis, unspecified site: Secondary | ICD-10-CM | POA: Diagnosis present

## 2018-11-22 DIAGNOSIS — I471 Supraventricular tachycardia: Secondary | ICD-10-CM | POA: Diagnosis not present

## 2018-11-22 DIAGNOSIS — J189 Pneumonia, unspecified organism: Secondary | ICD-10-CM | POA: Diagnosis present

## 2018-11-22 DIAGNOSIS — Z66 Do not resuscitate: Secondary | ICD-10-CM | POA: Diagnosis not present

## 2018-11-22 DIAGNOSIS — Z20828 Contact with and (suspected) exposure to other viral communicable diseases: Secondary | ICD-10-CM | POA: Diagnosis present

## 2018-11-22 DIAGNOSIS — J440 Chronic obstructive pulmonary disease with acute lower respiratory infection: Secondary | ICD-10-CM | POA: Diagnosis present

## 2018-11-22 DIAGNOSIS — I700 Atherosclerosis of aorta: Secondary | ICD-10-CM | POA: Diagnosis not present

## 2018-11-22 DIAGNOSIS — M810 Age-related osteoporosis without current pathological fracture: Secondary | ICD-10-CM | POA: Diagnosis present

## 2018-11-22 DIAGNOSIS — E43 Unspecified severe protein-calorie malnutrition: Secondary | ICD-10-CM | POA: Diagnosis present

## 2018-11-22 DIAGNOSIS — E7800 Pure hypercholesterolemia, unspecified: Secondary | ICD-10-CM | POA: Diagnosis present

## 2018-11-22 LAB — HEPATIC FUNCTION PANEL
ALT: 14 U/L (ref 0–44)
AST: 23 U/L (ref 15–41)
Albumin: 2.9 g/dL — ABNORMAL LOW (ref 3.5–5.0)
Alkaline Phosphatase: 49 U/L (ref 38–126)
Bilirubin, Direct: 0.1 mg/dL (ref 0.0–0.2)
Indirect Bilirubin: 0.6 mg/dL (ref 0.3–0.9)
Total Bilirubin: 0.7 mg/dL (ref 0.3–1.2)
Total Protein: 5.2 g/dL — ABNORMAL LOW (ref 6.5–8.1)

## 2018-11-22 LAB — BLOOD CULTURE ID PANEL (REFLEXED)

## 2018-11-22 LAB — RETICULOCYTES
Immature Retic Fract: 11.4 % (ref 2.3–15.9)
RBC.: 3.91 MIL/uL (ref 3.87–5.11)
Retic Count, Absolute: 60.2 10*3/uL (ref 19.0–186.0)
Retic Ct Pct: 1.5 % (ref 0.4–3.1)

## 2018-11-22 LAB — CBC
HCT: 37 % (ref 36.0–46.0)
HCT: 36.5 % (ref 36.0–46.0)
Hemoglobin: 11.7 g/dL — ABNORMAL LOW (ref 12.0–15.0)
Hemoglobin: 11.7 g/dL — ABNORMAL LOW (ref 12.0–15.0)
MCH: 29.5 pg (ref 26.0–34.0)
MCH: 29.7 pg (ref 26.0–34.0)
MCHC: 31.6 g/dL (ref 30.0–36.0)
MCHC: 32.1 g/dL (ref 30.0–36.0)
MCV: 93.2 fL (ref 80.0–100.0)
MCV: 92.6 fL (ref 80.0–100.0)
Platelets: 221 10*3/uL (ref 150–400)
Platelets: 221 10*3/uL (ref 150–400)
RBC: 3.97 MIL/uL (ref 3.87–5.11)
RBC: 3.94 MIL/uL (ref 3.87–5.11)
RDW: 15 % (ref 11.5–15.5)
RDW: 15.1 % (ref 11.5–15.5)
WBC: 7.4 10*3/uL (ref 4.0–10.5)
WBC: 8 10*3/uL (ref 4.0–10.5)
nRBC: 0 % (ref 0.0–0.2)
nRBC: 0 % (ref 0.0–0.2)

## 2018-11-22 LAB — FERRITIN: Ferritin: 95 ng/mL (ref 11–307)

## 2018-11-22 LAB — PHOSPHORUS: Phosphorus: 3.3 mg/dL (ref 2.5–4.6)

## 2018-11-22 LAB — MAGNESIUM: Magnesium: 2.1 mg/dL (ref 1.7–2.4)

## 2018-11-22 LAB — FOLATE: Folate: 12.9 ng/mL (ref >5.9)

## 2018-11-22 LAB — CREATININE, SERUM
Creatinine, Ser: 0.83 mg/dL (ref 0.44–1.00)
GFR calc Af Amer: >60 mL/min (ref >60)
GFR calc non Af Amer: >60 mL/min (ref >60)

## 2018-11-22 LAB — IRON AND TIBC
Iron: 63 ug/dL (ref 28–170)
Saturation Ratios: 35 % — ABNORMAL HIGH (ref 10.4–31.8)
TIBC: 178 ug/dL — ABNORMAL LOW (ref 250–450)
UIBC: 115 ug/dL

## 2018-11-22 LAB — BASIC METABOLIC PANEL
Anion gap: 9 (ref 5–15)
BUN: 13 mg/dL (ref 8–23)
CO2: 25 mmol/L (ref 22–32)
Calcium: 8.4 mg/dL — ABNORMAL LOW (ref 8.9–10.3)
Chloride: 102 mmol/L (ref 98–111)
Creatinine, Ser: 0.81 mg/dL (ref 0.44–1.00)
GFR calc Af Amer: >60 mL/min (ref >60)
GFR calc non Af Amer: >60 mL/min (ref >60)
Glucose, Bld: 79 mg/dL (ref 70–99)
Potassium: 3.2 mmol/L — ABNORMAL LOW (ref 3.5–5.1)
Sodium: 136 mmol/L (ref 135–145)

## 2018-11-22 LAB — STREP PNEUMONIAE URINARY ANTIGEN: Strep Pneumo Urinary Antigen: NEGATIVE

## 2018-11-22 LAB — CBG MONITORING, ED: Glucose-Capillary: 80 mg/dL (ref 70–99)

## 2018-11-22 LAB — SARS CORONAVIRUS 2 BY RT PCR (HOSPITAL ORDER, PERFORMED IN ~~LOC~~ HOSPITAL LAB): SARS Coronavirus 2: NEGATIVE

## 2018-11-22 LAB — VITAMIN B12: Vitamin B-12: 771 pg/mL (ref 180–914)

## 2018-11-22 MED ORDER — ONDANSETRON HCL 4 MG PO TABS: 4.0000 mg | ORAL_TABLET | Freq: Four times a day (QID) | ORAL | Status: DC | PRN

## 2018-11-22 MED ORDER — ADULT MULTIVITAMIN W/MINERALS CH
1.0000 | ORAL_TABLET | Freq: Every day | ORAL | Status: DC
  Administered 2018-11-22 – 2018-12-04 (×12): 1 via ORAL
  Filled 2018-11-22 (×12): qty 1

## 2018-11-22 MED ORDER — SERTRALINE HCL 25 MG PO TABS
25.0000 mg | ORAL_TABLET | Freq: Every day | ORAL | Status: DC
  Administered 2018-11-22: 11:00:00 25 mg via ORAL
  Filled 2018-11-22: qty 1

## 2018-11-22 MED ORDER — POTASSIUM CHLORIDE CRYS ER 20 MEQ PO TBCR
40.0000 meq | EXTENDED_RELEASE_TABLET | Freq: Two times a day (BID) | ORAL | Status: AC
  Administered 2018-11-22 (×2): 40 meq via ORAL
  Filled 2018-11-22 (×2): qty 2

## 2018-11-22 MED ORDER — HALOPERIDOL LACTATE 5 MG/ML IJ SOLN
2.0000 mg | Freq: Once | INTRAMUSCULAR | Status: AC
  Administered 2018-11-22: 20:00:00 2 mg via INTRAVENOUS
  Filled 2018-11-22: qty 1

## 2018-11-22 MED ORDER — ONDANSETRON HCL 4 MG/2ML IJ SOLN: 4.0000 mg | Freq: Four times a day (QID) | INTRAMUSCULAR | Status: DC | PRN

## 2018-11-22 MED ORDER — SODIUM CHLORIDE 0.9 % IV BOLUS
500.0000 mL | Freq: Once | INTRAVENOUS | Status: AC
  Administered 2018-11-22: 500 mL via INTRAVENOUS

## 2018-11-22 MED ORDER — ACETAMINOPHEN 325 MG PO TABS
650.0000 mg | ORAL_TABLET | Freq: Four times a day (QID) | ORAL | Status: DC | PRN
  Administered 2018-11-24 – 2018-12-03 (×12): 650 mg via ORAL
  Filled 2018-11-22 (×11): qty 2

## 2018-11-22 MED ORDER — PANTOPRAZOLE SODIUM 40 MG PO TBEC
40.0000 mg | DELAYED_RELEASE_TABLET | Freq: Every day | ORAL | Status: DC
  Administered 2018-11-22 – 2018-12-04 (×12): 40 mg via ORAL
  Filled 2018-11-22 (×12): qty 1

## 2018-11-22 MED ORDER — ACETAMINOPHEN 650 MG RE SUPP: 650.0000 mg | Freq: Four times a day (QID) | RECTAL | Status: DC | PRN

## 2018-11-22 MED ORDER — SODIUM CHLORIDE 0.9 % IV SOLN
INTRAVENOUS | Status: AC
  Administered 2018-11-22 (×2): via INTRAVENOUS

## 2018-11-22 MED ORDER — ENOXAPARIN SODIUM 30 MG/0.3ML ~~LOC~~ SOLN
30.0000 mg | SUBCUTANEOUS | Status: DC
  Administered 2018-11-22 – 2018-12-03 (×11): 30 mg via SUBCUTANEOUS
  Filled 2018-11-22 (×12): qty 0.3

## 2018-11-22 MED ORDER — LORAZEPAM 2 MG/ML IJ SOLN
0.5000 mg | Freq: Once | INTRAMUSCULAR | Status: AC
  Administered 2018-11-23: 0.5 mg via INTRAVENOUS
  Filled 2018-11-22: qty 1

## 2018-11-22 MED ORDER — SODIUM CHLORIDE 0.9 % IV SOLN
2.0000 g | INTRAVENOUS | Status: DC
  Administered 2018-11-22 – 2018-11-23 (×2): 2 g via INTRAVENOUS
  Filled 2018-11-22: qty 20
  Filled 2018-11-22 (×2): qty 2

## 2018-11-22 MED ORDER — SODIUM CHLORIDE 0.9 % IV SOLN
500.0000 mg | INTRAVENOUS | Status: DC
  Administered 2018-11-22: 500 mg via INTRAVENOUS
  Filled 2018-11-22 (×2): qty 500

## 2018-11-22 NOTE — Progress Notes (Signed)
Lovenox per Pharmacy for DVT Prophylaxis    Pharmacy has been consulted from dosing enoxaparin (lovenox) in this patient for DVT prophylaxis.  The pharmacist has reviewed pertinent labs (Hgb _13.5__; PLT__305_), patient weight (_43.4__kg) and renal function (CrCl_23__mL/min) and decided that enoxaparin _30_mg SQ Q24Hrs is appropriate for this patient.  The pharmacy department will sign off at this time.  Please reconsult pharmacy if status changes or for further issues.  Thank you  Cyndia Diver PharmD, BCPS  11/22/2018, 2:05 AM

## 2018-11-22 NOTE — Progress Notes (Signed)
PHARMACY - PHYSICIAN COMMUNICATION CRITICAL VALUE ALERT - BLOOD CULTURE IDENTIFICATION (BCID)  Lindsey Norris is an 85 y.o. female who presented to [facility] on 11/21/2018 with a chief complaint of hypotension.   Assessment:  Patient currently on azithromycin + ceftriaxone empiric antibiotic coverage for possible PNA and UTI. BCID + 1/4 GPC, staphylococcus species, mecA detected - possible contaminant.  WBC 8 - WNL Lactate 1  Name of physician (or Provider) Contacted: Dr. Alfredia Ferguson  Current antibiotics: Ceftriaxone + azithromycin  Changes to prescribed antibiotics: No changes at this time. MD to order repeat blood cultures and monitor.   Results for orders placed or performed during the hospital encounter of 11/21/18  Blood Culture ID Panel (Reflexed) (Collected: 11/21/2018  5:47 PM)  Result Value Ref Range   Enterococcus species NOT DETECTED NOT DETECTED   Listeria monocytogenes NOT DETECTED NOT DETECTED   Staphylococcus species DETECTED (A) NOT DETECTED   Staphylococcus aureus (BCID) NOT DETECTED NOT DETECTED   Methicillin resistance DETECTED (A) NOT DETECTED   Streptococcus species NOT DETECTED NOT DETECTED   Streptococcus agalactiae NOT DETECTED NOT DETECTED   Streptococcus pneumoniae NOT DETECTED NOT DETECTED   Streptococcus pyogenes NOT DETECTED NOT DETECTED   Acinetobacter baumannii NOT DETECTED NOT DETECTED   Enterobacteriaceae species NOT DETECTED NOT DETECTED   Enterobacter cloacae complex NOT DETECTED NOT DETECTED   Escherichia coli NOT DETECTED NOT DETECTED   Klebsiella oxytoca NOT DETECTED NOT DETECTED   Klebsiella pneumoniae NOT DETECTED NOT DETECTED   Proteus species NOT DETECTED NOT DETECTED   Serratia marcescens NOT DETECTED NOT DETECTED   Haemophilus influenzae NOT DETECTED NOT DETECTED   Neisseria meningitidis NOT DETECTED NOT DETECTED   Pseudomonas aeruginosa NOT DETECTED NOT DETECTED   Candida albicans NOT DETECTED NOT DETECTED   Candida glabrata NOT  DETECTED NOT DETECTED   Candida krusei NOT DETECTED NOT DETECTED   Candida parapsilosis NOT DETECTED NOT DETECTED   Candida tropicalis NOT DETECTED NOT DETECTED    Lenis Noon, PharmD 11/22/2018  5:06 PM

## 2018-11-22 NOTE — ED Notes (Signed)
Pt turned to back, resting with eyes closed. Respirations even and unlabored. Easily awaken by touch.

## 2018-11-22 NOTE — Progress Notes (Signed)
PROGRESS NOTE    Lindsey Norris  WUJ:[id number] DOB: 31-Mar-1926 DOA: 11/21/2018 PCP: Gaspar Garbe, MD   Brief Narrative: HPI per Dr. Midge Minium on 11/21/2018 Lindsey Norris is a 85 y.o. female with history of hypertension, hearing loss possible dementia recently admitted for UTI 3 weeks ago was brought to the ER after patient was found to be hypotensive.  Per report provided by the ER physician as patient is confused and at this time patient's care has been taken over by state since patient's husband was refusing to transfer patient to the ER patient was found to be hypotensive by patient's home health aide and EMS was called yesterday but patient has been refused transfer.  Again today patient was visited by the home health aide again found to be hypotensive at this time state has taken the custody of patient's care and patient was transferred to ER.  ED Course: In the ER patient was hypotensive with a blood pressure in the 80s was given fluid bolus.  Labs show increased creatinine from baseline of 1.48 3 weeks ago weights around 1.1 with sodium of 130 UA concerning for UTI and chest x-ray showing some infiltrates.  Blood cultures urine cultures were obtained for sepsis work-up and started on empiric antibiotics.  COVID-19 test was negative.  WBC count was 10 and hemoglobin was 13.5.  EKG shows normal sinus rhythm.  **Interim History  Patient continues to be pleasantly demented and screaming out "help me" while with examiner.  Cannot provide a meaningful subjective history to me.  Still remains somewhat hypotensive so we will give another half liter bolus and continue IV fluid hydration.  PT OT does still evaluate and treat.  Assessment & Plan:   Principal Problem:   Hypotension Active Problems:   Hypertension   UTI (urinary tract infection)  Hypotension likely could be from Dehydration.   -Does not appear to be septic.   -However given that patient has UTI and possible  pneumonia patient has been placed on empiric antibiotics and follow cultures.  -Given 1 Liter bolus in the ED and will repeat 500 mL Bolus  -Gently Hydrate with NS at 75 mL/hr  -BP remains on the lower side  -Will need a PT/OT Evaluation for placement; per my discussion with social work patient cannot go anywhere until she is placed due to her being under APS custody now -APS evaluated to evaluate the home yesterday and the decision was to obtain an emergency court order and patient is now under state custody  Hyponatremia, improved -Patient's Sodium on admission was 130 and ? In the setting of HCTZ -Given 1 Liter of NS bolus and will repeat 500 mL NS Bolus -C/w Gentle IVF Hydration at 75 mL/hr -Continue to Monitor and Trend -Repeat CMP in AM   ? UTI with compounding likely dementia -Recently had a UTI 3 weeks ago -Cx's at that time showed Multiple Species Present -Urinalysis this admission showed large leukocytes, rare bacteria, 11-20 WBCs -Urine culture still pending -Patient is on antibiotics for HCAP coverage -Continue to Monitor and Trend Cx's  Left Basilar Pneumonia, poA -CXR showed "Stable cardiomediastinal silhouette. Atherosclerosis of thoracic aorta is noted. No pneumothorax is noted. No pleural effusion is noted. Right lung is clear. Mild left basilar atelectasis or infiltrate is noted. Bony thorax is unremarkable." -C/w Antibiotics with IV Ceftriaxone and Azithromycin -Will order Flutter Valve, Incentive Spirometry, and Guaifenesin   AKI, improved -likely could be from dehydration and HCTZ -IVF as above; C/w  NS at 75 mL/hr -Avoid Nephrotoxic Medications, Contrast Dyes, and Hypotension if possible -Continue to monitor and trend renal function -Repeat CMP in a.m.  History of Hypertension  -Continue hold Hydrochlorothiazide due to low normal blood pressure and dehydration and renal failure.  History of Depression -C/w Sertraline 25 mg po Daily   Hypokalemia  -Patient's potassium this morning was 3.2 and a drop from 4.3 -Check magnesium level -Replete with p.o. potassium chloride 40 mg twice daily x2 doses -Continue to monitor and replete as necessary -Repeat CMP in AM   Normocytic Anemia -Patient is hemoglobin/hematocrit is 11.7/36.5 and likely had a dilutional drop from admission given her gentle IV fluid hydration -Checked Anemia Panel showed an iron level 63, U IBC 115, TIBC 178, saturation ratio 35%, ferritin level 95, and vitamin B12 771 -Continue to monitor for signs and symptoms of bleeding; currently no overt bleeding noted -Repeat CBC in a.m.  Hyperbilirubinemia -Patient's T Bili was 1.5 -Now trended down and is now 0.7 -Continue to Monitor and Trend and Repeat CMP in AM  Hypokalemia -Patient's K+ was 3.2 -Replete with po KCl 40 mEQ BID x2 -Continue to Monitor and Replete as Necessary -Repeat CMP in AM   DVT prophylaxis: Enoxaparin 30 mg sq q24h Code Status: FULL CODE  Family Communication: No family present at bedside  Disposition Plan: Pending further evaluation by PT and OT and placement via APS  Consultants:   None  Procedures: None   Antimicrobials:  Anti-infectives (From admission, onward)   Start     Dose/Rate Route Frequency Ordered Stop   11/22/18 1800  cefTRIAXone (ROCEPHIN) 2 g in sodium chloride 0.9 % 100 mL IVPB     2 g 200 mL/hr over 30 Minutes Intravenous Every 24 hours 11/22/18 0143 11/27/18 1759   11/22/18 1800  azithromycin (ZITHROMAX) 500 mg in sodium chloride 0.9 % 250 mL IVPB     500 mg 250 mL/hr over 60 Minutes Intravenous Every 24 hours 11/22/18 0143 11/27/18 1759   11/21/18 1930  cefTRIAXone (ROCEPHIN) 1 g in sodium chloride 0.9 % 100 mL IVPB     1 g 200 mL/hr over 30 Minutes Intravenous  Once 11/21/18 1917 11/21/18 2106   11/21/18 1930  azithromycin (ZITHROMAX) 500 mg in sodium chloride 0.9 % 250 mL IVPB     500 mg 250 mL/hr over 60 Minutes Intravenous  Once 11/21/18 1917 11/22/18 0005      Subjective: Seen and examined at bedside and patient unable to provide a subjective and meaningful history due to her advanced dementia.  When I awoke her from her sleep she certainly I "help me" and she is moaning.  No nausea or vomiting noted.  Currently still receiving IV fluids and antibiotics.  APS is involved and she is now under state custody.    Objective: Vitals:   11/22/18 0530 11/22/18 0600 11/22/18 0630 11/22/18 0700  BP: (!) 121/56 103/61  117/74  Pulse: (!) 59 (!) 55  65  Resp: (!) 24 15 (!) 22 20  Temp:      TempSrc:      SpO2: 100% 100%  97%  Weight:      Height:       No intake or output data in the 24 hours ending 11/22/18 0751 Filed Weights   11/21/18 1722  Weight: 43.4 kg   Examination: Physical Exam:  Constitutional: Thin elderly demented Caucasian female in NAD and appears slightly agitated when awoken from sleep Eyes: Lids and conjunctivae normal, sclerae anicteric  ENMT: External Ears, Nose appear normal. Hard of Hearing Neck: Appears normal, supple, no cervical masses, normal ROM, no appreciable thyromegaly; no JVD Respiratory: Diminished to auscultation bilaterally, no wheezing, rales, rhonchi or crackles. Normal respiratory effort and patient is not tachypenic. Unlabored breathing  Cardiovascular: RRR, no murmurs / rubs / gallops. S1 and S2 auscultated. No extremity edema. Abdomen: Soft, non-tender, non-distended. Bowel sounds positive.  GU: Deferred. Musculoskeletal: No clubbing / cyanosis of digits/nails. No joint deformity upper and lower extremities on a limited skin evaluation Skin: No rashes, lesions, ulcers on a limited skin evaluation. No induration; Warm and dry.  Neurologic: CN 2-12 grossly intact with no focal deficits. Romberg sign and cerebellar reflexes not assessed.  Psychiatric: Impaired judgment and insight. Drowsy and Sleepy appearing. Anxious and mildly agitated mood and appropriate affect.   Data Reviewed: I have personally  reviewed following labs and imaging studies  CBC: Recent Labs  Lab 11/21/18 1747 11/22/18 0144 11/22/18 0501  WBC 10.0 7.4 8.0  NEUTROABS 6.1  --   --   HGB 13.5 11.7* 11.7*  HCT 40.8 37.0 36.5  MCV 90.5 93.2 92.6  PLT 305 221 221   Basic Metabolic Panel: Recent Labs  Lab 11/21/18 1747 11/22/18 0144 11/22/18 0501  NA 130*  --  136  K 4.3  --  3.2*  CL 92*  --  102  CO2 27  --  25  GLUCOSE 101*  --  79  BUN 20  --  13  CREATININE 1.13* 0.83 0.81  CALCIUM 9.1  --  8.4*   GFR: Estimated Creatinine Clearance: 31.6 mL/min (by C-G formula based on SCr of 0.81 mg/dL). Liver Function Tests: Recent Labs  Lab 11/21/18 1747  AST 39  ALT 13  ALKPHOS 60  BILITOT 1.5*  PROT 6.6  ALBUMIN 3.8   No results for input(s): LIPASE, AMYLASE in the last 168 hours. No results for input(s): AMMONIA in the last 168 hours. Coagulation Profile: No results for input(s): INR, PROTIME in the last 168 hours. Cardiac Enzymes: No results for input(s): CKTOTAL, CKMB, CKMBINDEX, TROPONINI in the last 168 hours. BNP (last 3 results) No results for input(s): PROBNP in the last 8760 hours. HbA1C: No results for input(s): HGBA1C in the last 72 hours. CBG: No results for input(s): GLUCAP in the last 168 hours. Lipid Profile: No results for input(s): CHOL, HDL, LDLCALC, TRIG, CHOLHDL, LDLDIRECT in the last 72 hours. Thyroid Function Tests: No results for input(s): TSH, T4TOTAL, FREET4, T3FREE, THYROIDAB in the last 72 hours. Anemia Panel: No results for input(s): VITAMINB12, FOLATE, FERRITIN, TIBC, IRON, RETICCTPCT in the last 72 hours. Sepsis Labs: Recent Labs  Lab 11/21/18 1746 11/21/18 2028  LATICACIDVEN 1.4 1.0    Recent Results (from the past 240 hour(s))  SARS Coronavirus 2 [facility] order, Performed in [facility] hospital lab) Nasopharyngeal Nasopharyngeal Swab     Status: None   Collection Time: 11/21/18  8:28 PM   Specimen: Nasopharyngeal Swab  Result Value Ref Range Status    SARS Coronavirus 2 NEGATIVE NEGATIVE Final    Comment: (NOTE) If result is NEGATIVE SARS-CoV-2 target nucleic acids are NOT DETECTED. The SARS-CoV-2 RNA is generally detectable in upper and lower  respiratory specimens during the acute phase of infection. The lowest  concentration of SARS-CoV-2 viral copies this assay can detect is 250  copies / mL. A negative result does not preclude SARS-CoV-2 infection  and should not be used as the sole basis for treatment or other  patient  management decisions.  A negative result may occur with  improper specimen collection / handling, submission of specimen other  than nasopharyngeal swab, presence of viral mutation(s) within the  areas targeted by this assay, and inadequate number of viral copies  (<250 copies / mL). A negative result must be combined with clinical  observations, patient history, and epidemiological information. If result is POSITIVE SARS-CoV-2 target nucleic acids are DETECTED. The SARS-CoV-2 RNA is generally detectable in upper and lower  respiratory specimens dur ing the acute phase of infection.  Positive  results are indicative of active infection with SARS-CoV-2.  Clinical  correlation with patient history and other diagnostic information is  necessary to determine patient infection status.  Positive results do  not rule out bacterial infection or co-infection with other viruses. If result is PRESUMPTIVE POSTIVE SARS-CoV-2 nucleic acids MAY BE PRESENT.   A presumptive positive result was obtained on the submitted specimen  and confirmed on repeat testing.  While 2019 novel coronavirus  (SARS-CoV-2) nucleic acids may be present in the submitted sample  additional confirmatory testing may be necessary for epidemiological  and / or clinical management purposes  to differentiate between  SARS-CoV-2 and other Sarbecovirus currently known to infect humans.  If clinically indicated additional testing with an alternate test   methodology [phone]([id number]) is advised. The SARS-CoV-2 RNA is generally  detectable in upper and lower respiratory sp ecimens during the acute  phase of infection. The expected result is Negative. Fact Sheet for Patients:  BoilerBrush.com.cyhttps://www.fda.gov/media/136312/download Fact Sheet for Healthcare Providers: https://pope.com/https://www.fda.gov/media/136313/download This test is not yet approved or cleared by the [place] FDA and has been authorized for detection and/or diagnosis of SARS-CoV-2 by FDA under an Emergency Use Authorization (EUA).  This EUA will remain in effect (meaning this test can be used) for the duration of the COVID-19 declaration under Section 564(b)(1) of the Act, 21 U.S.C. section 360bbb-3(b)(1), unless the authorization is terminated or revoked sooner. Performed at [facility], [address] [place], [place] [address]     Radiology Studies: Dg Chest Port 1 View  Result Date: 11/21/2018 CLINICAL DATA:  Weakness. EXAM: PORTABLE CHEST 1 VIEW COMPARISON:  Radiograph of October 26, 2018. FINDINGS: Stable cardiomediastinal silhouette. Atherosclerosis of thoracic aorta is noted. No pneumothorax is noted. No pleural effusion is noted. Right lung is clear. Mild left basilar atelectasis or infiltrate is noted. Bony thorax is unremarkable. IMPRESSION: Stable mild left basilar atelectasis or infiltrate. Aortic Atherosclerosis (ICD10-I70.0). Electronically Signed   By: Lupita RaiderJames  Green Jr M.D.   On: 11/21/2018 18:35   Scheduled Meds: . enoxaparin (LOVENOX) injection  30 mg Subcutaneous Q24H  . multivitamin with minerals  1 tablet Oral Daily  . pantoprazole  40 mg Oral Daily  . sertraline  25 mg Oral Daily   Continuous Infusions: . sodium chloride 75 mL/hr at 11/22/18 0230  . azithromycin    . cefTRIAXone (ROCEPHIN)  IV      LOS: 0 days   Merlene Laughtermair Latif Sheikh, DO [facility] PAGER is on AMION  If 7PM-7AM, please contact night-coverage [email or web address] Password [email or web address]  11/22/2018, 7:51 AM

## 2018-11-22 NOTE — ED Notes (Signed)
Patient given lunch tray and asked if she would like to eat lunch. Patient said, "later" and is resting comfortably. Will continue to monitor.

## 2018-11-22 NOTE — ED Notes (Signed)
Pt spouse requests "the doctor" to call him tmrw AM @ [phone] "Between 8-10), to explain to him his wife's medical problem.

## 2018-11-22 NOTE — Progress Notes (Signed)
CSW called to ED as pt husband did not seem aware of the current situation regarding APS and was stating that he wants to bring his wife home.  CSW spoke with pt husband, Gwenlyn Fudge, who stated that he did not understand why he had been told by his wife's MD recently that there "was not anything more that they could do" for his wife and now they are going to admit her to the hospital.  CSW attempted to answer Armand's questions but he wanted to talk to someone who could explain the medical situation. CSW asked Gwenlyn Fudge if he knew that adult protective services is not involved with his wife's care and Gwenlyn Fudge did not appear to understand this.   CSW asked Gwenlyn Fudge if there was any family or other supports involved that we could contact and he reported that his son lives in [place] and is going to be driving to [place] starting on Monday and should be here after 3 days.  Gwenlyn Fudge did not want CSW to contact his son.  CSW verified that we do have the guardianship order from [place], which the RN did have, dated 11/21/18.  CSW spoke with RN about attempting to answer husband's medical questions. Winferd Humphrey, MSW, LCSW Advanced Care Supervisor 11/22/2018 2:48 PM

## 2018-11-22 NOTE — ED Notes (Signed)
Pt resting on right side, eyes closed, respirations even and regular. Easily awaken by touch due to pt HOH.

## 2018-11-22 NOTE — ED Notes (Signed)
Pt husband is at bedside currently. Spouse has many questions that this RN has attempted to answer- but he continues to demand answers to questions that are related to her PCP phone visit. Spouse also states that his son is a Chief Executive Officer, and he is driving from [place] to take care of things. Attending has been notified, is unable to come speak with family at this time.

## 2018-11-22 NOTE — ED Notes (Signed)
Pt resting with eyes closed, respirations even and unlabored. Easily awaken by touch due to pt being [facility].

## 2018-11-22 NOTE — ED Notes (Signed)
Pt continuously yells out "Help Me!", and states "Bring me my lunch, I'm dying!". Informed pt that lunch will be provided shortly. Safety sitter is now at bedside as a precaution due to pt agitation.

## 2018-11-22 NOTE — ED Notes (Signed)
Assisted pt on to bedpan

## 2018-11-22 NOTE — ED Notes (Signed)
Patient given breakfast tray and patient was assisted with eating breakfast.

## 2018-11-22 NOTE — ED Notes (Signed)
Repositioned pt in bed, provided pt with a lunch tray- pt states "Maybe later".

## 2018-11-22 NOTE — ED Notes (Signed)
Pt moved to hospital bed for comfort.

## 2018-11-22 NOTE — ED Notes (Signed)
Pt turned to left side. Resting with eyes closed, respirations even and regular.

## 2018-11-23 ENCOUNTER — Inpatient Hospital Stay (HOSPITAL_COMMUNITY): Payer: Medicare Other

## 2018-11-23 DIAGNOSIS — J189 Pneumonia, unspecified organism: Secondary | ICD-10-CM

## 2018-11-23 LAB — COMPREHENSIVE METABOLIC PANEL
ALT: 15 U/L (ref 0–44)
AST: 23 U/L (ref 15–41)
Albumin: 3 g/dL — ABNORMAL LOW (ref 3.5–5.0)
Alkaline Phosphatase: 50 U/L (ref 38–126)
Anion gap: 9 (ref 5–15)
BUN: 9 mg/dL (ref 8–23)
CO2: 23 mmol/L (ref 22–32)
Calcium: 8.4 mg/dL — ABNORMAL LOW (ref 8.9–10.3)
Chloride: 106 mmol/L (ref 98–111)
Creatinine, Ser: 0.7 mg/dL (ref 0.44–1.00)
GFR calc Af Amer: >60 mL/min (ref >60)
GFR calc non Af Amer: >60 mL/min (ref >60)
Glucose, Bld: 102 mg/dL — ABNORMAL HIGH (ref 70–99)
Potassium: 3.5 mmol/L (ref 3.5–5.1)
Sodium: 138 mmol/L (ref 135–145)
Total Bilirubin: 0.5 mg/dL (ref 0.3–1.2)
Total Protein: 5.2 g/dL — ABNORMAL LOW (ref 6.5–8.1)

## 2018-11-23 LAB — CBC WITH DIFFERENTIAL/PLATELET
Abs Immature Granulocytes: 0.05 10*3/uL (ref 0.00–0.07)
Basophils Absolute: 0.1 10*3/uL (ref 0.0–0.1)
Basophils Relative: 1 %
Eosinophils Absolute: 0.1 10*3/uL (ref 0.0–0.5)
Eosinophils Relative: 1 %
HCT: 38.9 % (ref 36.0–46.0)
Hemoglobin: 12.2 g/dL (ref 12.0–15.0)
Immature Granulocytes: 1 %
Lymphocytes Relative: 16 %
Lymphs Abs: 1.6 10*3/uL (ref 0.7–4.0)
MCH: 29.4 pg (ref 26.0–34.0)
MCHC: 31.4 g/dL (ref 30.0–36.0)
MCV: 93.7 fL (ref 80.0–100.0)
Monocytes Absolute: 0.8 10*3/uL (ref 0.1–1.0)
Monocytes Relative: 8 %
Neutro Abs: 7.5 10*3/uL (ref 1.7–7.7)
Neutrophils Relative %: 73 %
Platelets: 225 10*3/uL (ref 150–400)
RBC: 4.15 MIL/uL (ref 3.87–5.11)
RDW: 15.2 % (ref 11.5–15.5)
WBC: 10.1 10*3/uL (ref 4.0–10.5)
nRBC: 0 % (ref 0.0–0.2)

## 2018-11-23 LAB — LEGIONELLA PNEUMOPHILA SEROGP 1 UR AG: L. pneumophila Serogp 1 Ur Ag: NEGATIVE

## 2018-11-23 LAB — PHOSPHORUS: Phosphorus: 2.9 mg/dL (ref 2.5–4.6)

## 2018-11-23 LAB — MAGNESIUM: Magnesium: 1.6 mg/dL — ABNORMAL LOW (ref 1.7–2.4)

## 2018-11-23 MED ORDER — MAGNESIUM SULFATE 2 GM/50ML IV SOLN
2.0000 g | Freq: Once | INTRAVENOUS | Status: AC
  Administered 2018-11-23: 2 g via INTRAVENOUS
  Filled 2018-11-23: qty 50

## 2018-11-23 MED ORDER — POTASSIUM CHLORIDE CRYS ER 20 MEQ PO TBCR
40.0000 meq | EXTENDED_RELEASE_TABLET | Freq: Two times a day (BID) | ORAL | Status: DC
  Administered 2018-11-23: 40 meq via ORAL
  Filled 2018-11-23: qty 2

## 2018-11-23 MED ORDER — IPRATROPIUM BROMIDE 0.02 % IN SOLN
0.5000 mg | Freq: Three times a day (TID) | RESPIRATORY_TRACT | Status: DC
  Administered 2018-11-24: 0.5 mg via RESPIRATORY_TRACT
  Filled 2018-11-23: qty 2.5

## 2018-11-23 MED ORDER — SODIUM CHLORIDE 0.9 % IV SOLN
INTRAVENOUS | Status: AC
  Administered 2018-11-23 – 2018-11-24 (×3): via INTRAVENOUS

## 2018-11-23 MED ORDER — AZITHROMYCIN 250 MG PO TABS
500.0000 mg | ORAL_TABLET | ORAL | Status: AC
  Administered 2018-11-23 – 2018-11-25 (×3): 500 mg via ORAL
  Filled 2018-11-23 (×3): qty 2

## 2018-11-23 MED ORDER — LEVALBUTEROL HCL 0.63 MG/3ML IN NEBU
0.6300 mg | INHALATION_SOLUTION | Freq: Three times a day (TID) | RESPIRATORY_TRACT | Status: DC
  Administered 2018-11-24: 0.63 mg via RESPIRATORY_TRACT
  Filled 2018-11-23: qty 3

## 2018-11-23 MED ORDER — LEVALBUTEROL HCL 0.63 MG/3ML IN NEBU
0.6300 mg | INHALATION_SOLUTION | Freq: Four times a day (QID) | RESPIRATORY_TRACT | Status: DC
  Administered 2018-11-23 (×2): 0.63 mg via RESPIRATORY_TRACT
  Filled 2018-11-23 (×2): qty 3

## 2018-11-23 MED ORDER — SERTRALINE HCL 25 MG PO TABS
25.0000 mg | ORAL_TABLET | Freq: Every day | ORAL | Status: DC
  Administered 2018-11-23 – 2018-12-04 (×12): 25 mg via ORAL
  Filled 2018-11-23 (×12): qty 1

## 2018-11-23 MED ORDER — IPRATROPIUM BROMIDE 0.02 % IN SOLN: 0.5000 mg | Freq: Four times a day (QID) | RESPIRATORY_TRACT | Status: DC

## 2018-11-23 MED ORDER — IPRATROPIUM BROMIDE 0.02 % IN SOLN
0.5000 mg | Freq: Four times a day (QID) | RESPIRATORY_TRACT | Status: DC
  Administered 2018-11-23 (×2): 0.5 mg via RESPIRATORY_TRACT
  Filled 2018-11-23 (×2): qty 2.5

## 2018-11-23 MED ORDER — LEVALBUTEROL HCL 0.63 MG/3ML IN NEBU: 0.6300 mg | INHALATION_SOLUTION | Freq: Four times a day (QID) | RESPIRATORY_TRACT | Status: DC

## 2018-11-23 NOTE — Evaluation (Signed)
Occupational Therapy Evaluation Patient Details Name: Lindsey Norris MRN: [id number] DOB: 1927-02-01 Today's Date: 11/23/2018    History of Present Illness Lindsey Norris is a 85 y.o. female with history of hypertension, hearing loss possible dementia recently admitted for UTI 3 weeks ago was brought to the ER after patient was found to be hypotensive.   Clinical Impression   Pt admitted with above diagnoses, baseline cognitive deficits, generalized weakness, and decreased activity tolerance limiting ability to engage in BADL at desired level of ind. PTA, pt living with husband with [facility] aides. Per chart review, APS is now involved with care. Pt is mostly unresponsive on evaluation, despite alerting techniques. When sitting EOB with total A +2 for bed mobility, pt improving alertness slightly. Per chart review, very HOH at baseline and occasioanlly used whiteboard to communicate. Pt was able to wash face and brush hair EOB with multimodal cueing and mod-max A. Otherwise, pt is mostly total care. At this time, recommending SNF placement at d/c or long term care setting for safety. Will continue to follow POC listed below.     Follow Up Recommendations  SNF;Supervision/Assistance - 24 hour;Other (comment)(long term care placement)    Equipment Recommendations  None recommended by OT    Recommendations for Other Services Other (comment)(Palliative)     Precautions / Restrictions Precautions Precautions: Fall Precaution Comments: Pt HOH, uses white board to communicate. Pt is very fearful of being left alone (per last admission) Restrictions Weight Bearing Restrictions: No      Mobility Bed Mobility Overal bed mobility: Needs Assistance Bed Mobility: Supine to Sit     Supine to sit: Total assist;+2 for physical assistance Sit to supine: Total assist;+2 for physical assistance   General bed mobility comments: total A +2 with use of bed peds, pt giving no effort or automatic  movement. Slight improvement in alertness EOB  Transfers Overall transfer level: Needs assistance Equipment used: 2 person hand held assist Transfers: Sit to/from Stand Sit to Stand: Max assist;Total assist;+2 physical assistance         General transfer comment: Stood x 2 reps. Once with MAX HHA of 2. Second rep, while her bad pad was being changed, she stood with TOT A with a bear hug.    Balance Overall balance assessment: Needs assistance   Sitting balance-Leahy Scale: Poor Sitting balance - Comments: Needed MIN A except for a few seconds every so often with min/guard. She was able to sit with L forearm elbow prop and close min/guard. Postural control: Left lateral lean Standing balance support: Bilateral upper extremity supported Standing balance-Leahy Scale: Zero Standing balance comment: Pt needing MAX A due to lethargy and fatigue                           ADL either performed or assessed with clinical judgement   ADL Overall ADL's : Needs assistance/impaired     Grooming: Sitting;Moderate assistance;Maximal assistance Grooming Details (indicate cue type and reason): washed face with multimodal cues; hand over hand to brush hair                             Functional mobility during ADLs: Total assistance(bed level only) General ADL Comments: pt currently total A for all BADLs despite grooming sitting EOB     Vision Baseline Vision/History: Wears glasses Patient Visual Report: No change from baseline       Perception  Praxis      Pertinent Vitals/Pain Pain Assessment: Faces Faces Pain Scale: No hurt     Hand Dominance     Extremity/Trunk Assessment Upper Extremity Assessment Upper Extremity Assessment: Generalized weakness   Lower Extremity Assessment Lower Extremity Assessment: Defer to PT evaluation   Cervical / Trunk Assessment Cervical / Trunk Assessment: Kyphotic   Communication Communication Communication:  HOH;Other (comment)(used white board in past)   Cognition Arousal/Alertness: Lethargic;Suspect due to medications Behavior During Therapy: Flat affect Overall Cognitive Status: History of cognitive impairments - at baseline                                 General Comments: hx of dementia, minimally responsive today with eyes closed most of session   General Comments  Pt lethargic, but did wake up some and open her eye while sitting EOB.    Exercises     Shoulder Instructions      Home Living Family/patient expects to be discharged to:: Skilled nursing facility                                 Additional Comments: pt unable to answer questions, very minimally responsive/conversive. Hx obtained via chart review      Prior Functioning/Environment Level of Independence: Needs assistance  Gait / Transfers Assistance Needed: Last admission, pt was able to ambulate 7' with MIN A of 2 and a RW on 10/30/18. ADL's / Homemaking Assistance Needed: had [facility] aides for BADL   Comments: Lives with husband, APS is now involved for guardianship        OT Problem List: Decreased strength;Decreased activity tolerance;Impaired balance (sitting and/or standing);Pain;Decreased knowledge of use of DME or AE;Decreased cognition      OT Treatment/Interventions: Self-care/ADL training;DME and/or AE instruction;Patient/family education;Balance training;Therapeutic activities;Therapeutic exercise;Cognitive remediation/compensation    OT Goals(Current goals can be found in the care plan section) Acute Rehab OT Goals Patient Stated Goal: none stated OT Goal Formulation: Patient unable to participate in goal setting Time For Goal Achievement: 12/07/18 Potential to Achieve Goals: Fair  OT Frequency: Min 2X/week   Barriers to D/C:            Co-evaluation PT/OT/SLP Co-Evaluation/Treatment: Yes Reason for Co-Treatment: Complexity of the patient's impairments (multi-system  involvement);For patient/therapist safety;To address functional/ADL transfers;Necessary to address cognition/behavior during functional activity PT goals addressed during session: Mobility/safety with mobility;Balance OT goals addressed during session: ADL's and self-care;Proper use of Adaptive equipment and DME;Strengthening/ROM      AM-PAC OT "6 Clicks" Daily Activity     Outcome Measure Help from another person eating meals?: A Lot Help from another person taking care of personal grooming?: A Lot Help from another person toileting, which includes using toliet, bedpan, or urinal?: Total Help from another person bathing (including washing, rinsing, drying)?: Total Help from another person to put on and taking off regular upper body clothing?: Total Help from another person to put on and taking off regular lower body clothing?: Total 6 Click Score: 8   End of Session Nurse Communication: Mobility status  Activity Tolerance: Patient limited by lethargy Patient left: in bed;with call bell/phone within reach;with bed alarm set  OT Visit Diagnosis: Unsteadiness on feet (R26.81);Muscle weakness (generalized) (M62.81);Other abnormalities of gait and mobility (R26.89);Other symptoms and signs involving cognitive function  Time: 1326-1350 OT Time Calculation (min): 24 min Charges:  OT General Charges $OT Visit: 1 Visit OT Evaluation $OT Eval Moderate Complexity: 1 Mod  Dalphine HandingKaylee Coumba Kellison, MSOT, OTR/L Behavioral Health OT/ Acute Relief OT [facility] Office: [phone]  Dalphine HandingKaylee Allyce Bochicchio 11/23/2018, 3:19 PM

## 2018-11-23 NOTE — Progress Notes (Signed)
Per tele, pt had a 45 beat run of SVT, asymptomatic. VSS. Pt awake and alert, attempting to get OOB at times. Yelling. Now ST 102. MD [facility] aware. Will continue to monitor closely.

## 2018-11-23 NOTE — Progress Notes (Signed)
Patient continues to sleep after Haldol and Ativan given last night. Pt is arouseable, will open her eyes briefly and moan. Pt sleeping with her mouth open, very diminished air exchange in lungs. VSS on RA. Pt unable to take any PO meds or eat so far this shift d/t lethargy. MD [facility] aware of above. Husband briefly at bedside, was concerned about wife not waking up. This RN explained to husband that pt was very agitated, attempting to get out of bed and biting last night. RN explained that pt was very unsafe and might have hurt herself, so pt was given medication to help her relax. Husband appreciative of RN and attempted to wake pt up himself. Will continue to monitor closely.

## 2018-11-23 NOTE — Evaluation (Signed)
Physical Therapy Evaluation Patient Details Name: Lindsey Norris MRN: [id number] DOB: 10/21/1926 Today's Date: 11/23/2018   History of Present Illness  Lindsey Norris is a 85 y.o. female with history of hypertension, hearing loss possible dementia recently admitted for UTI 3 weeks ago was brought to the ER after patient was found to be hypotensive.  Clinical Impression  Pt admitted with above diagnosis.  Pt currently with functional limitations due to the deficits listed below (see PT Problem List). Pt will benefit from skilled PT to increase their independence and safety with mobility to allow discharge to the venue listed below.  Pt lethargic at eval, but did sit up and open her eyes.  Do not feel husband can safely care for pt at current level of mobility. Recommend SNF.     Follow Up Recommendations SNF    Equipment Recommendations  None recommended by PT    Recommendations for Other Services       Precautions / Restrictions Precautions Precautions: Fall Precaution Comments: Pt HOH, uses white board to communicate. Pt is very fearful of being left alone (per last admission) Restrictions Weight Bearing Restrictions: No      Mobility  Bed Mobility Overal bed mobility: Needs Assistance Bed Mobility: Supine to Sit     Supine to sit: Total assist;+2 for physical assistance Sit to supine: Total assist;+2 for physical assistance   General bed mobility comments: Pt transferred to EOB with use of bed pads and with no effort given.  Pt became more alert while sitting EOB.  Transfers Overall transfer level: Needs assistance Equipment used: 2 person hand held assist Transfers: Sit to/from Stand Sit to Stand: Max assist;Total assist;+2 physical assistance         General transfer comment: Stood x 2 reps. Once with MAX HHA of 2. Second rep, while her bad pad was being changed, she stood with TOT A with a bear hug.  Ambulation/Gait             General Gait Details:  too lethargic and weak to attempt  Stairs            Wheelchair Mobility    Modified Rankin (Stroke Patients Only)       Balance Overall balance assessment: Needs assistance   Sitting balance-Leahy Scale: Poor Sitting balance - Comments: Needed MIN A except for a few seconds every so often with min/guard. She was able to sit with L forearm elbow prop and close min/guard. Postural control: Left lateral lean Standing balance support: Bilateral upper extremity supported Standing balance-Leahy Scale: Zero Standing balance comment: Pt needing MAX A due to lethargy and fatigue                             Pertinent Vitals/Pain Pain Assessment: Faces Faces Pain Scale: No hurt    Home Living Family/patient expects to be discharged to:: Skilled nursing facility                      Prior Function     Gait / Transfers Assistance Needed: Last admission, pt was able to ambulate 7' with MIN A of 2 and a RW on 10/30/18.     Comments: Lives with husband and was receiving HH.  APS has become involved.     Hand Dominance        Extremity/Trunk Assessment   Upper Extremity Assessment Upper Extremity Assessment: Defer to OT evaluation  Lower Extremity Assessment Lower Extremity Assessment: Generalized weakness    Cervical / Trunk Assessment Cervical / Trunk Assessment: Kyphotic  Communication   Communication: HOH(used white board to communicate in the past)  Cognition Arousal/Alertness: Lethargic;Suspect due to medications   Overall Cognitive Status: History of cognitive impairments - at baseline                                 General Comments: hx of dementia      General Comments General comments (skin integrity, edema, etc.): Pt lethargic, but did wake up some and open her eye while sitting EOB.    Exercises     Assessment/Plan    PT Assessment Patient needs continued PT services  PT Problem List Decreased  strength;Decreased activity tolerance;Decreased balance;Decreased mobility;Decreased coordination;Decreased cognition;Decreased knowledge of use of DME;Decreased safety awareness       PT Treatment Interventions DME instruction;Gait training;Functional mobility training;Therapeutic activities;Therapeutic exercise;Balance training;Neuromuscular re-education;Patient/family education    PT Goals (Current goals can be found in the Care Plan section)  Acute Rehab PT Goals Patient Stated Goal: none stated PT Goal Formulation: Patient unable to participate in goal setting Time For Goal Achievement: 12/07/18 Potential to Achieve Goals: Fair    Frequency Min 2X/week   Barriers to discharge Decreased caregiver support Doubtful her husband can safely care for her at current level. APS involved. Son is coming from [place] in next few days.    Co-evaluation PT/OT/SLP Co-Evaluation/Treatment: Yes Reason for Co-Treatment: Complexity of the patient's impairments (multi-system involvement);For patient/therapist safety PT goals addressed during session: Mobility/safety with mobility;Balance         AM-PAC PT "6 Clicks" Mobility  Outcome Measure Help needed turning from your back to your side while in a flat bed without using bedrails?: Total Help needed moving from lying on your back to sitting on the side of a flat bed without using bedrails?: Total Help needed moving to and from a bed to a chair (including a wheelchair)?: Total Help needed standing up from a chair using your arms (e.g., wheelchair or bedside chair)?: Total Help needed to walk in hospital room?: Total Help needed climbing 3-5 steps with a railing? : Total 6 Click Score: 6    End of Session Equipment Utilized During Treatment: Gait belt Activity Tolerance: Patient limited by lethargy Patient left: in bed;with call bell/phone within reach;with bed alarm set Nurse Communication: Mobility status PT Visit Diagnosis:  Unsteadiness on feet (R26.81);Muscle weakness (generalized) (M62.81);Other abnormalities of gait and mobility (R26.89)    Time: 0962-8366 PT Time Calculation (min) (ACUTE ONLY): 31 min   Charges:   PT Evaluation $PT Eval Moderate Complexity: 1 Mod          Macario Shear L. Tamala Julian, [place] Pager [phone] 11/23/2018   Galen Manila 11/23/2018, 3:08 PM

## 2018-11-23 NOTE — Progress Notes (Addendum)
PROGRESS NOTE    Lindsey Norris  ZOX:[id number] DOB: 12-15-1926 DOA: 11/21/2018 PCP: Gaspar Garbe, MD   Brief Narrative: HPI per Dr. Midge Minium on 11/21/2018 Lindsey Norris is a 85 y.o. female with history of hypertension, hearing loss possible dementia recently admitted for UTI 3 weeks ago was brought to the ER after patient was found to be hypotensive.  Per report provided by the ER physician as patient is confused and at this time patient's care has been taken over by state since patient's husband was refusing to transfer patient to the ER patient was found to be hypotensive by patient's home health aide and EMS was called yesterday but patient has been refused transfer.  Again today patient was visited by the home health aide again found to be hypotensive at this time state has taken the custody of patient's care and patient was transferred to ER.  ED Course: In the ER patient was hypotensive with a blood pressure in the 80s was given fluid bolus.  Labs show increased creatinine from baseline of 1.48 3 weeks ago weights around 1.1 with sodium of 130 UA concerning for UTI and chest x-ray showing some infiltrates.  Blood cultures urine cultures were obtained for sepsis work-up and started on empiric antibiotics.  COVID-19 test was negative.  WBC count was 10 and hemoglobin was 13.5.  EKG shows normal sinus rhythm.  **Interim History  Patient continued to be pleasantly demented and screaming out "help me" while I examined her yesterday.  Today she was somnolent and had received Haldol and lorazepam as her agitation yesterday.  She cannot cannot provide a meaningful subjective history to me today.  Hypotension is improving.  PT OT to still evaluate and treat  Assessment & Plan:   Principal Problem:   Hypotension Active Problems:   Hypertension   UTI (urinary tract infection)  Hypotension likely could be from Dehydration in the setting of suspected pneumonia.   -Does not  appear to be septic.   -However given that patient has UTI and possible pneumonia patient has been placed on empiric antibiotics and follow cultures.  -Given 1 Liter bolus in the ED and will repeat 500 mL Bolus  -Gently Hydrate with NS at 75 mL/hr  -BP remains on the lower side  -Will need a PT/OT Evaluation for placement; per my discussion with social work patient cannot go anywhere until she is placed due to her being under APS custody now -[facility] evaluated to evaluate the home yesterday and the decision was to obtain an emergency court order and patient is now under state custody -Repeat chest x-ray this a.m. is pending -Patient claims status is improved and she is no longer hypotensive -Repeat Blood Pressure this AM was 140/78 -Continue to monitor and trend blood pressures per protocol  Hyponatremia, improved -Patient's Sodium on admission was 130 and ? In the setting of HCTZ -Given 1.5 boluses and started on maintenance IV fluid which is now been stopped -Continue to Monitor and Trend and now sodium is 138 -Repeat CMP in AM   ? UTI with compounding likely dementia -Recently had a UTI 3 weeks ago -Cx's at that time showed Multiple Species Present -Urinalysis this admission showed large leukocytes, rare bacteria, 11-20 WBCs -Urine culture still pending and was reintubated for better growth -Blood cultures x2 will be repeated given possible contamination -Patient is on antibiotics for Pneumonia coverage -Continue to Monitor and Trend Cx's and blood cultures have been repeated as above  Left Basilar  Pneumonia, poA -CXR showed "Stable cardiomediastinal silhouette. Atherosclerosis of thoracic aorta is noted. No pneumothorax is noted. No pleural effusion is noted. Right lung is clear. Mild left basilar atelectasis or infiltrate is noted. Bony thorax is unremarkable." -C/w Antibiotics with IV Ceftriaxone and Azithromycin -Also added Xopenex/Atrovent every 6 hours scheduled -Will order  Flutter Valve, Incentive Spirometry, and Guaifenesin  -Repeat chest x-ray pending to be done  AKI, improved -likely could be from dehydration and HCTZ -IVF now discontinued and BUN/creatinine is now 9/0.70 -Avoid Nephrotoxic Medications, Contrast Dyes, and Hypotension if possible -Continue to monitor and trend renal function -Repeat CMP in a.m.  History of Hypertension  -Continue hold Hydrochlorothiazide due to low normal blood pressure and dehydration and renal failure.  History of Depression -C/w Sertraline 25 mg po Daily   Normocytic Anemia -Patient is hemoglobin/hematocrit was 11.7/36.5 and likely had a dilutional drop from admission given her gentle IV fluid hydration; now hemoglobin/[name] is 12.2/30.9 -Checked Anemia Panel showed an iron level 63, U IBC 115, TIBC 178, saturation ratio 35%, ferritin level 95, and vitamin B12 771 -Continue to monitor for signs and symptoms of bleeding; currently no overt bleeding noted -Repeat CBC in a.m.  Hyperbilirubinemia -Patient's T Bili was 1.5 -Now trended down and is now 0.5 -Continue to Monitor and Trend and Repeat CMP in AM  Hypokalemia -Patient's K+ was 3.2 improved to 3.5 -Replete with po KCl 40 mEQ BID x2 again if she is awake enough -Continue to Monitor and Replete as Necessary -Repeat CMP in AM   Hypomagnesemia -Patient magnesium level this morning is 1.6 -Replete with IV mag sulfate 2 g We will continue to monitor replete as necessary -Repeat magnesium level in a.m.  1 out of 4 positive blood culture -Possibly contaminant -Shows staphylococcal species assistant -Repeat blood cultures and if necessary will then change antibiotics in place on IV vancomycin however feel this is a contaminant  Moderate Dementia with Behavior Disturbances -Started on Sertraline 25 mg po Daily per Neurology for Mood Stabilization -Given Haldol and Lorazepam by Night Coverage and will not give anymore -Palliative Care Consulted for GOC  Discussion and Family Situation -Delirium Precautions -Safety Sitter  DVT prophylaxis: Enoxaparin 30 mg sq q24h Code Status: FULL CODE  Family Communication: No family present at bedside but spoke to patient's husband at length this morning Disposition Plan: Pending further evaluation by PT and OT and placement via APS  Consultants:   Palliative Care  Procedures: None   Antimicrobials:  Anti-infectives (From admission, onward)   Start     Dose/Rate Route Frequency Ordered Stop   11/22/18 1800  cefTRIAXone (ROCEPHIN) 2 g in sodium chloride 0.9 % 100 mL IVPB     2 g 200 mL/hr over 30 Minutes Intravenous Every 24 hours 11/22/18 0143 11/27/18 1759   11/22/18 1800  azithromycin (ZITHROMAX) 500 mg in sodium chloride 0.9 % 250 mL IVPB     500 mg 250 mL/hr over 60 Minutes Intravenous Every 24 hours 11/22/18 0143 11/27/18 1759   11/21/18 1930  cefTRIAXone (ROCEPHIN) 1 g in sodium chloride 0.9 % 100 mL IVPB     1 g 200 mL/hr over 30 Minutes Intravenous  Once 11/21/18 1917 11/21/18 2106   11/21/18 1930  azithromycin (ZITHROMAX) 500 mg in sodium chloride 0.9 % 250 mL IVPB     500 mg 250 mL/hr over 60 Minutes Intravenous  Once 11/21/18 1917 11/22/18 0005     Subjective: Seen and examined at bedside and patient unable to provide a  subjective and meaningful history again and she had been given Haldol and Ativan last night because of her significant agitation.  I woke her up and she would mumble and follow back to sleep.  PT unable to work with her this morning and will try again in the afternoon.  Objective: Vitals:   11/22/18 2021 11/23/18 0443 11/23/18 1315 11/23/18 1318  BP: 97/62 (!) 147/66  140/78  Pulse: 80 90  99  Resp: 18 20  (!) 22  Temp: 98.2 F (36.8 C) 98.4 F (36.9 C)  98.2 F (36.8 C)  TempSrc: Oral Axillary  Oral  SpO2: 100% 92% 100% 100%  Weight:  40 kg    Height:        Intake/Output Summary (Last 24 hours) at 11/23/2018 1338 Last data filed at 11/23/2018 0600  Gross per 24 hour  Intake 690 ml  Output 852 ml  Net -162 ml   Filed Weights   11/21/18 1722 11/23/18 0443  Weight: 43.4 kg 40 kg   Examination: Physical Exam:  Constitutional: Thin elderly demented Caucasian female currently appears calm and comfortable and drowsy and somnolent given that she just had been given Haldol and Ativan early this morning Eyes: Lids and conjunctivae normal, sclerae anicteric  ENMT: External Ears, Nose appear normal. Grossly normal hearing. Neck: Appears normal, supple, no cervical masses, normal ROM, no appreciable thyromegaly; no JVD Respiratory: Diminished to auscultation bilaterally with coarse breath sounds and rhonchi; no wheezing, rales, or crackles. Normal respiratory effort and patient is not tachypenic. No accessory muscle use.  Unlabored breathing but she is a mouth breather especially while sleeping Cardiovascular: RRR, no murmurs / rubs / gallops. S1 and S2 auscultated. No extremity edema noted Abdomen: Soft, non-tender, non-distended. Bowel sounds positive x4.  GU: Deferred. Musculoskeletal: No clubbing / cyanosis of digits/nails. No joint deformity upper and lower extremities.  Skin: No rashes, lesions, ulcers on a limited skin evaluation. No induration; Warm and dry.  Neurologic: CN 2-12 grossly intact with no focal deficits. Romberg sign and cerebellar reflexes not assessed.  Psychiatric: Impaired judgment and insight. Drowsy and Sleepy and she rouses but goes back to sleep  Data Reviewed: I have personally reviewed following labs and imaging studies  CBC: Recent Labs  Lab 11/21/18 1747 11/22/18 0144 11/22/18 0501 11/23/18 0350  WBC 10.0 7.4 8.0 10.1  NEUTROABS 6.1  --   --  7.5  HGB 13.5 11.7* 11.7* 12.2  HCT 40.8 37.0 36.5 38.9  MCV 90.5 93.2 92.6 93.7  PLT 305 221 221 225   Basic Metabolic Panel: Recent Labs  Lab 11/21/18 1747 11/22/18 0144 11/22/18 0501 11/23/18 0350  NA 130*  --  136 138  K 4.3  --  3.2* 3.5  CL 92*   --  102 106  CO2 27  --  25 23  GLUCOSE 101*  --  79 102*  BUN 20  --  13 9  CREATININE 1.13* 0.83 0.81 0.70  CALCIUM 9.1  --  8.4* 8.4*  MG  --   --  2.1 1.6*  PHOS  --   --  3.3 2.9   GFR: Estimated Creatinine Clearance: 29.5 mL/min (by C-G formula based on SCr of 0.7 mg/dL). Liver Function Tests: Recent Labs  Lab 11/21/18 1747 11/22/18 0501 11/23/18 0350  AST 39 23 23  ALT ALKPHOS 60 49 50  BILITOT 1.5* 0.7 0.5  PROT 6.6 5.2* 5.2*  ALBUMIN 3.8 2.9* 3.0*   No results for  input(s): LIPASE, AMYLASE in the last 168 hours. No results for input(s): AMMONIA in the last 168 hours. Coagulation Profile: No results for input(s): INR, PROTIME in the last 168 hours. Cardiac Enzymes: No results for input(s): CKTOTAL, CKMB, CKMBINDEX, TROPONINI in the last 168 hours. BNP (last 3 results) No results for input(s): PROBNP in the last 8760 hours. HbA1C: No results for input(s): HGBA1C in the last 72 hours. CBG: Recent Labs  Lab 11/22/18 1209  GLUCAP 80   Lipid Profile: No results for input(s): CHOL, HDL, LDLCALC, TRIG, CHOLHDL, LDLDIRECT in the last 72 hours. Thyroid Function Tests: No results for input(s): TSH, T4TOTAL, FREET4, T3FREE, THYROIDAB in the last 72 hours. Anemia Panel: Recent Labs    11/22/18 0501 11/22/18 1515  VITAMINB12 771  --   FOLATE  --  12.9  FERRITIN 95  --   TIBC 178*  --   IRON 63  --   RETICCTPCT 1.5  --    Sepsis Labs: Recent Labs  Lab 11/21/18 1746 11/21/18 2028  LATICACIDVEN 1.4 1.0    Recent Results (from the past 240 hour(s))  Blood culture (routine x 2)     Status: None (Preliminary result)   Collection Time: 11/21/18  5:46 PM   Specimen: BLOOD RIGHT ARM  Result Value Ref Range Status   Specimen Description   Final    BLOOD RIGHT ARM Performed at [facility], [address] [place], [place] [address]    Special Requests   Final    BOTTLES DRAWN AEROBIC AND ANAEROBIC Blood Culture adequate volume  Performed at [facility], [address] [place] [address], [place], [place] [address]    Culture   Final    NO GROWTH 2 DAYS Performed at [facility], [address], [place], [place] [address]    Report Status PENDING  Incomplete  Blood culture (routine x 2)     Status: Abnormal (Preliminary result)   Collection Time: 11/21/18  5:47 PM   Specimen: BLOOD  Result Value Ref Range Status   Specimen Description   Final    BLOOD LEFT ANTECUBITAL Performed at [facility], [address], [place], [place] [address]    Special Requests   Final    BOTTLES DRAWN AEROBIC AND ANAEROBIC Blood Culture results may not be optimal due to an inadequate volume of blood received in culture bottles Performed at [facility], [address], [place], [place] [address]    Culture  Setup Time   Final    GRAM POSITIVE COCCI AEROBIC BOTTLE ONLY Organism ID to follow CRITICAL RESULT CALLED TO, READ BACK BY AND VERIFIED WITH: Oneta Rack [id number] MLM Performed at [facility], [address] [place], [place] [address]    Culture STAPHYLOCOCCUS SPECIES (COAGULASE NEGATIVE) (A)  Final   Report Status PENDING  Incomplete  Blood Culture ID Panel (Reflexed)     Status: Abnormal   Collection Time: 11/21/18  5:47 PM  Result Value Ref Range Status   Enterococcus species NOT DETECTED NOT DETECTED Final   Listeria monocytogenes NOT DETECTED NOT DETECTED Final   Staphylococcus species DETECTED (A) NOT DETECTED Final    Comment: Methicillin (oxacillin) resistant coagulase negative staphylococcus. Possible blood culture contaminant (unless isolated from more than one blood culture draw or clinical case suggests pathogenicity). No antibiotic treatment is indicated for blood  culture contaminants. CRITICAL RESULT CALLED TO, READ BACK BY AND VERIFIED WITH: PHARMD M BELL [id number]  MLM    Staphylococcus aureus (BCID) NOT DETECTED NOT DETECTED Final    Methicillin resistance DETECTED (A) NOT DETECTED Final    Comment: CRITICAL RESULT CALLED TO, READ BACK BY AND VERIFIED WITH: PHARMD M BELL [id number] MLM    Streptococcus species NOT DETECTED NOT DETECTED Final   Streptococcus agalactiae NOT DETECTED NOT DETECTED Final   Streptococcus pneumoniae NOT DETECTED NOT DETECTED Final   Streptococcus pyogenes NOT DETECTED NOT DETECTED Final   Acinetobacter baumannii NOT DETECTED NOT DETECTED Final   Enterobacteriaceae species NOT DETECTED NOT DETECTED Final   Enterobacter cloacae complex NOT DETECTED NOT DETECTED Final   Escherichia coli NOT DETECTED NOT DETECTED Final   Klebsiella oxytoca NOT DETECTED NOT DETECTED Final   Klebsiella pneumoniae NOT DETECTED NOT DETECTED Final   Proteus species NOT DETECTED NOT DETECTED Final   Serratia marcescens NOT DETECTED NOT DETECTED Final   Haemophilus influenzae NOT DETECTED NOT DETECTED Final   Neisseria meningitidis NOT DETECTED NOT DETECTED Final   Pseudomonas aeruginosa NOT DETECTED NOT DETECTED Final   Candida albicans NOT DETECTED NOT DETECTED Final   Candida glabrata NOT DETECTED NOT DETECTED Final   Candida krusei NOT DETECTED NOT DETECTED Final   Candida parapsilosis NOT DETECTED NOT DETECTED Final   Candida tropicalis NOT DETECTED NOT DETECTED Final    Comment: Performed at [facility], [address], [place], [place] [address]  Urine culture     Status: None (Preliminary result)   Collection Time: 11/21/18  8:28 PM   Specimen: Urine, Clean Catch  Result Value Ref Range Status   Specimen Description   Final    URINE, CLEAN CATCH Performed at [facility], [address] [place], [place] [address]    Special Requests   Final    NONE Performed at [facility], [address], [place], [place] [address]    Culture   Final    CULTURE REINCUBATED FOR BETTER GROWTH Performed at [facility], [address] [place], [place] [address]     Report Status PENDING  Incomplete  SARS Coronavirus 2 [facility] order, Performed in [facility] hospital lab) Nasopharyngeal Nasopharyngeal Swab     Status: None   Collection Time: 11/21/18  8:28 PM   Specimen: Nasopharyngeal Swab  Result Value Ref Range Status   SARS Coronavirus 2 NEGATIVE NEGATIVE Final    Comment: (NOTE) If result is NEGATIVE SARS-CoV-2 target nucleic acids are NOT DETECTED. The SARS-CoV-2 RNA is generally detectable in upper and lower  respiratory specimens during the acute phase of infection. The lowest  concentration of SARS-CoV-2 viral copies this assay can detect is 250  copies / mL. A negative result does not preclude SARS-CoV-2 infection  and should not be used as the sole basis for treatment or other  patient management decisions.  A negative result may occur with  improper specimen collection / handling, submission of specimen other  than nasopharyngeal swab, presence of viral mutation(s) within the  areas targeted by this assay, and inadequate number of viral copies  (<250 copies / mL). A negative result must be combined with clinical  observations, patient history, and epidemiological information. If result is POSITIVE SARS-CoV-2 target nucleic acids are DETECTED. The SARS-CoV-2 RNA is generally detectable in upper and lower  respiratory specimens dur ing the acute phase of infection.  Positive  results are indicative of active infection with SARS-CoV-2.  Clinical  correlation with patient history and other diagnostic information is  necessary to determine patient infection status.  Positive results do  not rule out bacterial infection or co-infection with other viruses. If result is PRESUMPTIVE POSTIVE SARS-CoV-2 nucleic acids MAY BE PRESENT.   A presumptive positive result was obtained on the submitted specimen  and confirmed on repeat testing.  While 2019 novel coronavirus  (SARS-CoV-2) nucleic acids may be present in the submitted sample   additional confirmatory testing may be necessary for epidemiological  and / or clinical management purposes  to differentiate between  SARS-CoV-2 and other Sarbecovirus currently known to infect humans.  If clinically indicated additional testing with an alternate test  methodology [phone]) is advised. The SARS-CoV-2 RNA is generally  detectable in upper and lower respiratory sp ecimens during the acute  phase of infection. The expected result is Negative. Fact Sheet for Patients:  StrictlyIdeas.no Fact Sheet for Healthcare Providers: BankingDealers.co.za This test is not yet approved or cleared by the [place] FDA and has been authorized for detection and/or diagnosis of SARS-CoV-2 by FDA under an Emergency Use Authorization (EUA).  This EUA will remain in effect (meaning this test can be used) for the duration of the COVID-19 declaration under Section 564(b)(1) of the Act, 21 U.S.C. section 360bbb-3(b)(1), unless the authorization is terminated or revoked sooner. Performed at [facility], [place] [address], [place], [place] [address]   Culture, blood (routine x 2)     Status: None (Preliminary result)   Collection Time: 11/22/18  5:18 PM   Specimen: BLOOD  Result Value Ref Range Status   Specimen Description   Final    BLOOD LEFT ANTECUBITAL Performed at [facility] [address] [place], [place] [address]    Special Requests   Final    BOTTLES DRAWN AEROBIC ONLY Blood Culture adequate volume Performed at [facility] [address], [place], [place] [address]    Culture   Final    NO GROWTH < 24 HOURS Performed at [facility] [address] [place], [place] [address]    Report Status PENDING  Incomplete  Culture, blood (routine x 2)     Status: None (Preliminary result)   Collection Time: 11/22/18  5:23 PM   Specimen: BLOOD LEFT FOREARM  Result Value  Ref Range Status   Specimen Description   Final    BLOOD LEFT FOREARM Performed at [facility], [place] [address] [place], [place] [address]    Special Requests   Final    BOTTLES DRAWN AEROBIC ONLY Blood Culture adequate volume Performed at [place] [address], [place], [place] [address]    Culture   Final    NO GROWTH < 24 HOURS Performed at [place] [address], [place], [place] [address]    Report Status PENDING  Incomplete    Radiology Studies: Dg Chest Port 1 View  Result Date: 11/21/2018 CLINICAL DATA:  Weakness. EXAM: PORTABLE CHEST 1 VIEW COMPARISON:  Radiograph of October 26, 2018. FINDINGS: Stable cardiomediastinal silhouette. Atherosclerosis of thoracic aorta is noted. No pneumothorax is noted. No pleural effusion is noted. Right lung is clear. Mild left basilar atelectasis or infiltrate is noted. Bony thorax is unremarkable. IMPRESSION: Stable mild left basilar atelectasis or infiltrate. Aortic Atherosclerosis (ICD10-I70.0). Electronically Signed   By: Marijo Conception M.D.   On: 11/21/2018 18:35   Scheduled Meds: . enoxaparin (LOVENOX) injection  30 mg Subcutaneous Q24H  . ipratropium  0.5 mg Nebulization Q6H  .  levalbuterol  0.63 mg Nebulization Q6H  . multivitamin with minerals  1 tablet Oral Daily  . pantoprazole  40 mg Oral Daily  . potassium chloride  40 mEq Oral BID  . sertraline  25 mg Oral Daily   Continuous Infusions: . azithromycin Stopped (11/22/18 2041)  . cefTRIAXone (ROCEPHIN)  IV Stopped (11/22/18 1931)    LOS: 1 day   Merlene Laughtermair Latif Liliana Brentlinger, DO [facility] PAGER is on AMION  If 7PM-7AM, please contact night-coverage [email or web address] Password [email or web address] 11/23/2018, 1:38 PM

## 2018-11-23 NOTE — Progress Notes (Signed)
PHARMACIST - PHYSICIAN COMMUNICATION DR:   Alfredia Ferguson CONCERNING: Antibiotic IV to Oral Route Change Policy  RECOMMENDATION: This patient is receiving azithromycin by the intravenous route.  Based on criteria approved by the Pharmacy and Therapeutics Committee, the antibiotic(s) is/are being converted to the equivalent oral dose form(s).   DESCRIPTION: These criteria include:  Patient being treated for a respiratory tract infection, urinary tract infection, cellulitis or clostridium difficile associated diarrhea if on metronidazole  The patient is not neutropenic and does not exhibit a GI malabsorption state  The patient is eating (either orally or via tube) and/or has been taking other orally administered medications for a least 24 hours  The patient is improving clinically and has a Tmax < 100.5  If you have questions about this conversion, please contact the [place], PharmD 11/23/18 4:14 PM

## 2018-11-24 DIAGNOSIS — N390 Urinary tract infection, site not specified: Secondary | ICD-10-CM

## 2018-11-24 DIAGNOSIS — B952 Enterococcus as the cause of diseases classified elsewhere: Secondary | ICD-10-CM

## 2018-11-24 LAB — COMPREHENSIVE METABOLIC PANEL
ALT: 16 U/L (ref 0–44)
AST: 22 U/L (ref 15–41)
Albumin: 3.3 g/dL — ABNORMAL LOW (ref 3.5–5.0)
Alkaline Phosphatase: 60 U/L (ref 38–126)
Anion gap: 16 — ABNORMAL HIGH (ref 5–15)
BUN: 5 mg/dL — ABNORMAL LOW (ref 8–23)
CO2: 22 mmol/L (ref 22–32)
Calcium: 8.4 mg/dL — ABNORMAL LOW (ref 8.9–10.3)
Chloride: 98 mmol/L (ref 98–111)
Creatinine, Ser: 0.53 mg/dL (ref 0.44–1.00)
GFR calc Af Amer: >60 mL/min (ref >60)
GFR calc non Af Amer: >60 mL/min (ref >60)
Glucose, Bld: 108 mg/dL — ABNORMAL HIGH (ref 70–99)
Potassium: 3 mmol/L — ABNORMAL LOW (ref 3.5–5.1)
Sodium: 136 mmol/L (ref 135–145)
Total Bilirubin: 0.7 mg/dL (ref 0.3–1.2)
Total Protein: 6.3 g/dL — ABNORMAL LOW (ref 6.5–8.1)

## 2018-11-24 LAB — URINE CULTURE: Culture: >=100000 — AB

## 2018-11-24 LAB — CBC WITH DIFFERENTIAL/PLATELET
Abs Immature Granulocytes: 0.04 10*3/uL (ref 0.00–0.07)
Basophils Absolute: 0.1 10*3/uL (ref 0.0–0.1)
Basophils Relative: 1 %
Eosinophils Absolute: 0.1 10*3/uL (ref 0.0–0.5)
Eosinophils Relative: 1 %
HCT: 44.1 % (ref 36.0–46.0)
Hemoglobin: 13.9 g/dL (ref 12.0–15.0)
Immature Granulocytes: 0 %
Lymphocytes Relative: 14 %
Lymphs Abs: 1.4 10*3/uL (ref 0.7–4.0)
MCH: 29.1 pg (ref 26.0–34.0)
MCHC: 31.5 g/dL (ref 30.0–36.0)
MCV: 92.3 fL (ref 80.0–100.0)
Monocytes Absolute: 0.9 10*3/uL (ref 0.1–1.0)
Monocytes Relative: 9 %
Neutro Abs: 7.9 10*3/uL — ABNORMAL HIGH (ref 1.7–7.7)
Neutrophils Relative %: 75 %
Platelets: 255 10*3/uL (ref 150–400)
RBC: 4.78 MIL/uL (ref 3.87–5.11)
RDW: 15 % (ref 11.5–15.5)
WBC: 10.4 10*3/uL (ref 4.0–10.5)
nRBC: 0 % (ref 0.0–0.2)

## 2018-11-24 LAB — CULTURE, BLOOD (ROUTINE X 2)

## 2018-11-24 LAB — PHOSPHORUS: Phosphorus: 2.2 mg/dL — ABNORMAL LOW (ref 2.5–4.6)

## 2018-11-24 LAB — MAGNESIUM: Magnesium: 1.7 mg/dL (ref 1.7–2.4)

## 2018-11-24 MED ORDER — IPRATROPIUM BROMIDE 0.02 % IN SOLN
0.5000 mg | Freq: Two times a day (BID) | RESPIRATORY_TRACT | Status: DC
  Administered 2018-11-24 – 2018-11-29 (×10): 0.5 mg via RESPIRATORY_TRACT
  Filled 2018-11-24 (×10): qty 2.5

## 2018-11-24 MED ORDER — LEVALBUTEROL HCL 0.63 MG/3ML IN NEBU
0.6300 mg | INHALATION_SOLUTION | Freq: Two times a day (BID) | RESPIRATORY_TRACT | Status: DC
  Administered 2018-11-24 – 2018-11-29 (×10): 0.63 mg via RESPIRATORY_TRACT
  Filled 2018-11-24 (×10): qty 3

## 2018-11-24 MED ORDER — AMOXICILLIN-POT CLAVULANATE 500-125 MG PO TABS
1.0000 | ORAL_TABLET | Freq: Two times a day (BID) | ORAL | Status: AC
  Administered 2018-11-24 – 2018-11-27 (×6): 500 mg via ORAL
  Filled 2018-11-24 (×6): qty 1

## 2018-11-24 MED ORDER — MAGNESIUM SULFATE IN D5W 1-5 GM/100ML-% IV SOLN
1.0000 g | Freq: Once | INTRAVENOUS | Status: AC
  Administered 2018-11-24: 1 g via INTRAVENOUS
  Filled 2018-11-24: qty 100

## 2018-11-24 MED ORDER — POTASSIUM CHLORIDE CRYS ER 20 MEQ PO TBCR
40.0000 meq | EXTENDED_RELEASE_TABLET | Freq: Once | ORAL | Status: AC
  Administered 2018-11-24: 40 meq via ORAL
  Filled 2018-11-24: qty 2

## 2018-11-24 MED ORDER — ENSURE ENLIVE PO LIQD
237.0000 mL | Freq: Two times a day (BID) | ORAL | Status: DC
  Administered 2018-11-24 – 2018-12-03 (×17): 237 mL via ORAL

## 2018-11-24 MED ORDER — BOOST / RESOURCE BREEZE PO LIQD CUSTOM
1.0000 | Freq: Two times a day (BID) | ORAL | Status: DC
  Administered 2018-11-24 – 2018-12-03 (×15): 1 via ORAL

## 2018-11-24 MED ORDER — KCL IN DEXTROSE-NACL 20-5-0.9 MEQ/L-%-% IV SOLN
INTRAVENOUS | Status: AC
  Administered 2018-11-24 – 2018-11-25 (×3): via INTRAVENOUS
  Filled 2018-11-24 (×2): qty 1000

## 2018-11-24 MED ORDER — POTASSIUM PHOSPHATES 15 MMOLE/5ML IV SOLN
20.0000 mmol | Freq: Once | INTRAVENOUS | Status: AC
  Administered 2018-11-24: 20 mmol via INTRAVENOUS
  Filled 2018-11-24: qty 6.67

## 2018-11-24 NOTE — Progress Notes (Signed)
PROGRESS NOTE    Lindsey Norris  WRU:[id number] DOB: [birth year] DOA: 11/21/2018 PCP: Gaspar Garbe, MD   Brief Narrative: HPI per Dr. Midge Minium on 11/21/2018 Lindsey Norris is a 85 y.o. female with history of hypertension, hearing loss possible dementia recently admitted for UTI 3 weeks ago was brought to the ER after patient was found to be hypotensive.  Per report provided by the ER physician as patient is confused and at this time patient's care has been taken over by state since patient's husband was refusing to transfer patient to the ER patient was found to be hypotensive by patient's home health aide and EMS was called yesterday but patient has been refused transfer.  Again today patient was visited by the home health aide again found to be hypotensive at this time state has taken the custody of patient's care and patient was transferred to ER.  ED Course: In the ER patient was hypotensive with a blood pressure in the 80s was given fluid bolus.  Labs show increased creatinine from baseline of 1.48 3 weeks ago weights around 1.1 with sodium of 130 UA concerning for UTI and chest x-ray showing some infiltrates.  Blood cultures urine cultures were obtained for sepsis work-up and started on empiric antibiotics.  COVID-19 test was negative.  WBC count was 10 and hemoglobin was 13.5.  EKG shows normal sinus rhythm.  **Interim History  Patient continued to be pleasantly demented and screaming out "help me" while I examined her the day before yesterday.  Yesterday she was somnolent and had received Haldol and lorazepam as her agitation during the course of the night before.  She cannot cannot provide a meaningful subjective history to me today.  Hypotension is improving.  PT OT recommending skilled nursing facility.  Today she was a little somnolent when I examined her but nursing states that she has been up and active and very agitated.  Hospitalization complicated by intermittent  bouts of SVT that has not been sustaining.  Assessment & Plan:   Principal Problem:   Hypotension Active Problems:   Hypertension   UTI (urinary tract infection)  Hypotension likely could be from Dehydration in the setting of suspected pneumonia. Improving -Does not appear to be septic.   -However given that patient has UTI and possible pneumonia patient has been placed on empiric antibiotics and follow cultures.  -Given 1 Liter bolus in the ED and will repeat 500 mL Bolus  -Gently Hydrate with NS at 75 mL/hr and will continue for another 24 hours but this is now been stopped and patient was started on D5 normal saline +20 mEq of KCl at a rate of 75 mL's per hour given her poor appetite and p.o. intake -BP remained on the lower side but is now improved to 142/79 -Will need a PT/OT Evaluation for placement; per my discussion with social work patient cannot go anywhere until she is placed due to her being under APS custody now -APS evaluated to evaluate the home setting and the decision was to obtain an emergency court order and patient is now under state custody -Repeat chest x-ray this a.m. showed tiny pleural effusions and no change in airspace disease at the left base with atelectasis versus a pneumonia and showed mild cardiomegaly -Repeat Blood Pressure this AM was 142/79 -Continue to monitor and trend blood pressures per protocol -Added Xopenex and Atrovent twice daily given her rhonchi and she is improving -Strep pneumo urinary antigen and Legionella Pneumophilia Serogp 1  Ur Ag Negati -Continue with empiric antibiotics were changed to cover enterococcus for the urine as well as the pneumonia to Augmentin for 3 days  Hyponatremia, improved -Patient's Sodium on admission was 130 and ? In the setting of HCTZ -Given 1.5 boluses and started on maintenance IV fluid and will be continued throughout the day and renewed for D5 normal saline at 75 mL's per hour -Continue to Monitor and Trend  and now sodium is 136 -Repeat CMP in AM   Enterococcus UTI versus colonization with compounding likely dementia  -Recently had a UTI 3 weeks ago -Cx's at that time showed Multiple Species Present -Urinalysis this admission showed large leukocytes, rare bacteria, 11-20 WBCs -Urine culture still pending and was reintubated for better growth but is now showing Enterococcus faecalis with greater than 100,000 colonies -Enterococcus was pansensitive.  Change antibiotics to cover Enterococcus and Pneumonia and will change to Augmentin for 3 days -Blood cultures x2 will be repeated given possible contamination but did show Staphylococcus and gram-positive cocci -Patient is on antibiotics for Pneumonia coverage and will change to cover enterococcus -Continue to Monitor and Trend Cx's and blood cultures have been repeated as above  Left Basilar Pneumonia, poA -CXR showed "Stable cardiomediastinal silhouette. Atherosclerosis of thoracic aorta is noted. No pneumothorax is noted. No pleural effusion is noted. Right lung is clear. Mild left basilar atelectasis or infiltrate is noted. Bony thorax is unremarkable." -Repeat CXR showed "Suspected tiny pleural effusions. No change in airspace disease at the left base, atelectasis versus pneumonia Mild cardiomegaly." -Antibiotics with IV Ceftriaxone and Azithromycin changed to Augmentin for 3 more Days  -Also added Xopenex/Atrovent every 6 hours scheduled -Will order Flutter Valve, Incentive Spirometry, and Guaifenesin   AKI, improved Elevated anion gap -Anion gap today was 16 and worsened from 9; unclear etiology -likely could be from dehydration and HCTZ -IVF resumed for today at D5 normal saline +20 mEq of KCl at 75 mils per hour and BUN/creatinine is now 5/0.53 -Avoid Nephrotoxic Medications, Contrast Dyes, and Hypotension if possible -Continue to monitor and trend renal function -Repeat CMP in a.m.  History of Hypertension  -Continue hold  Hydrochlorothiazide due to low normal blood pressure and dehydration and renal failure. -Patient blood pressure now 142/79  History of Depression -C/w Sertraline 25 mg po Daily   Normocytic Anemia -Patient is hemoglobin/hematocrit was 11.7/36.5 and likely had a dilutional drop from admission given her gentle IV fluid hydration; now hemoglobin/hematocrit is now 13.9/44.1 -Checked Anemia Panel showed an iron level 63, U IBC 115, TIBC 178, saturation ratio 35%, ferritin level 95, and vitamin B12 771 -Continue to monitor for signs and symptoms of bleeding; currently no overt bleeding noted -Repeat CBC in a.m.  Hyperbilirubinemia -Patient's T Bili was 1.5 -Now trended down and is now 0.7 -Continue to Monitor and Trend and Repeat CMP in AM  Hypokalemia -Patient's K+ was 3.0 this a.m. -Replete with po KCl 40 mEQ as well as as well as IV K-Phos 20 mmol -Continue to Monitor and Replete as Necessary -Repeat CMP in AM   Hypomagnesemia -Patient magnesium level this morning is 1.7 -Replete with IV mag sulfate 1 g We will continue to monitor replete as necessary -Repeat magnesium level in a.m.  Hypophosphatemia -Patient's phosphorus level this morning was 2.2 -Replete with IV K-Phos 20 mmol -Continue to monitor and replete as necessary -Repeat phosphorus level in a.m.  1 out of 4 positive blood culture -Possibly contaminant -Shows staphylococcal species assistant -Repeat blood cultures and if necessary  will then change antibiotics in place on IV vancomycin however feel this is a contaminant; antibiotics were changed as above -Repeat blood culture showed no growth to date at 2 days  Moderate Dementia with Behavior Disturbances -Started on Sertraline 25 mg po Daily per Neurology for Mood Stabilization -Given Haldol and Lorazepam by Night Coverage and will not give anymore -Palliative Care Consulted for GOC Discussion and Family Situation given recent APS involvement -Delirium Precautions  -Safety Sitter  Intermittent SVT -Patient has had bursts of SVT and pulled back down to normal sinus rhythm -Replete electrolytes as above -Continue to monitor carefully on telemetry -If continues to worsen or persistent will need cardiology assistance evaluation  DVT prophylaxis: Enoxaparin 30 mg sq q24h Code Status: FULL CODE  Family Communication: No family present at bedside but spoke to patient's husband at length this morning Disposition Plan: SNF with assistance with social worker and placement via APS  Consultants:   Palliative Care  Procedures: None   Antimicrobials:  Anti-infectives (From admission, onward)   Start     Dose/Rate Route Frequency Ordered Stop   11/23/18 1800  azithromycin (ZITHROMAX) tablet 500 mg     500 mg Oral Every 24 hours 11/23/18 1616 11/26/18 1759   11/22/18 1800  cefTRIAXone (ROCEPHIN) 2 g in sodium chloride 0.9 % 100 mL IVPB     2 g 200 mL/hr over 30 Minutes Intravenous Every 24 hours 11/22/18 0143 11/27/18 1759   11/22/18 1800  azithromycin (ZITHROMAX) 500 mg in sodium chloride 0.9 % 250 mL IVPB  Status:  Discontinued     500 mg 250 mL/hr over 60 Minutes Intravenous Every 24 hours 11/22/18 0143 11/23/18 1614   11/21/18 1930  cefTRIAXone (ROCEPHIN) 1 g in sodium chloride 0.9 % 100 mL IVPB     1 g 200 mL/hr over 30 Minutes Intravenous  Once 11/21/18 1917 11/21/18 2106   11/21/18 1930  azithromycin (ZITHROMAX) 500 mg in sodium chloride 0.9 % 250 mL IVPB     500 mg 250 mL/hr over 60 Minutes Intravenous  Once 11/21/18 1917 11/22/18 0005     Subjective: Seen and examined at bedside she was calm and resting but nurses state that she has been extremely agitated earlier.  No nausea or vomiting.  Does sound rhonchorous still.  No family present at bedside.  Nurse also reports that she had a intermittent burst of SVT when she was getting repositioned and having a bowel movement but now this is improved.  Is back to normal sinus rhythm and will  continue monitor carefully.  No other concerns or complaints at this time.  Objective: Vitals:   11/24/18 0616 11/24/18 0651 11/24/18 0931 11/24/18 1151  BP: (!) 170/86 (!) 148/91 104/84 (!) 142/79  Pulse: 93 100 96 96  Resp:   16 16  Temp: 97.8 F (36.6 C) 97.9 F (36.6 C) 97.7 F (36.5 C) (!) 97.3 F (36.3 C)  TempSrc:   Oral Oral  SpO2: 97% 99% 100% 100%  Weight:      Height:        Intake/Output Summary (Last 24 hours) at 11/24/2018 1402 Last data filed at 11/24/2018 1000 Gross per 24 hour  Intake 2000.68 ml  Output 2800 ml  Net -799.32 ml   Filed Weights   11/21/18 1722 11/23/18 0443  Weight: 43.4 kg 40 kg  Examination: Physical Exam:  Constitutional: Thin elderly demented Caucasian female currently unable to provide a subjective history appears calm and comfortable sleeping and remains drowsy but  does arouse a little bit and falls back to sleep Eyes: Lids and conjunctivae normal, sclerae anicteric  ENMT: External Ears, Nose appear normal. Grossly normal hearing. Neck: Appears normal, supple, no cervical masses, normal ROM, no appreciable thyromegaly; no JVD Respiratory: Diminished to auscultation bilaterally with Rhonchi and coarse breath sounds; no appreciable wheezing, rales, crackles. Normal respiratory effort and patient is not tachypenic. No accessory muscle use. Unlabored breathing  Cardiovascular: Tachycardic Rate but regular rhythm, no murmurs / rubs / gallops. S1 and S2 auscultated. No extremity edema.  Abdomen: Soft, non-tender, non-distended. Bowel sounds positive x4.  GU: Deferred. Musculoskeletal: No clubbing / cyanosis of digits/nails. No joint deformity upper and lower extremities.  Skin: No rashes, lesions, ulcers on a limited skin evaluation. No induration; Warm and dry.  Neurologic: Remains somnolent and wakes up but does not follow commands and goes back to sleep.  Psychiatric: Impaired judgment and insight.  Appears calm  Data Reviewed: I have  personally reviewed following labs and imaging studies  CBC: Recent Labs  Lab 11/21/18 1747 11/22/18 0144 11/22/18 0501 11/23/18 0350 11/24/18 0423  WBC 10.0 7.4 8.0 10.1 10.4  NEUTROABS 6.1  --   --  7.5 7.9*  HGB 13.5 11.7* 11.7* 12.2 13.9  HCT 40.8 37.0 36.5 38.9 44.1  MCV 90.5 93.2 92.6 93.7 92.3  PLT 305 221 221 225 255   Basic Metabolic Panel: Recent Labs  Lab 11/21/18 1747 11/22/18 0144 11/22/18 0501 11/23/18 0350 11/24/18 0423  NA 130*  --  136 138 136  K 4.3  --  3.2* 3.5 3.0*  CL 92*  --  102 106 98  CO2 27  --  25 23 22   GLUCOSE 101*  --  79 102* 108*  BUN 20  --  13 9 5*  CREATININE 1.13* 0.83 0.81 0.70 0.53  CALCIUM 9.1  --  8.4* 8.4* 8.4*  MG  --   --  2.1 1.6* 1.7  PHOS  --   --  3.3 2.9 2.2*   GFR: Estimated Creatinine Clearance: 29.5 mL/min (by C-G formula based on SCr of 0.53 mg/dL). Liver Function Tests: Recent Labs  Lab 11/21/18 1747 11/22/18 0501 11/23/18 0350 11/24/18 0423  AST 39 23 23 22   ALT 13 14 15 16   ALKPHOS 60 49 50 60  BILITOT 1.5* 0.7 0.5 0.7  PROT 6.6 5.2* 5.2* 6.3*  ALBUMIN 3.8 2.9* 3.0* 3.3*   No results for input(s): LIPASE, AMYLASE in the last 168 hours. No results for input(s): AMMONIA in the last 168 hours. Coagulation Profile: No results for input(s): INR, PROTIME in the last 168 hours. Cardiac Enzymes: No results for input(s): CKTOTAL, CKMB, CKMBINDEX, TROPONINI in the last 168 hours. BNP (last 3 results) No results for input(s): PROBNP in the last 8760 hours. HbA1C: No results for input(s): HGBA1C in the last 72 hours. CBG: Recent Labs  Lab 11/22/18 1209  GLUCAP 80   Lipid Profile: No results for input(s): CHOL, HDL, LDLCALC, TRIG, CHOLHDL, LDLDIRECT in the last 72 hours. Thyroid Function Tests: No results for input(s): TSH, T4TOTAL, FREET4, T3FREE, THYROIDAB in the last 72 hours. Anemia Panel: Recent Labs    11/22/18 0501 11/22/18 1515  VITAMINB12 771  --   FOLATE  --  12.9  FERRITIN 95  --    TIBC 178*  --   IRON 63  --   RETICCTPCT 1.5  --    Sepsis Labs: Recent Labs  Lab 11/21/18 1746 11/21/18 2028  LATICACIDVEN 1.4 1.0  Recent Results (from the past 240 hour(s))  Blood culture (routine x 2)     Status: None (Preliminary result)   Collection Time: 11/21/18  5:46 PM   Specimen: BLOOD RIGHT ARM  Result Value Ref Range Status   Specimen Description   Final    BLOOD RIGHT ARM Performed at [facility], [address], [place], [place] [address]    Special Requests   Final    BOTTLES DRAWN AEROBIC AND ANAEROBIC Blood Culture adequate volume Performed at [facility], [address], [place], [place] [address]    Culture   Final    NO GROWTH 3 DAYS Performed at [facility], [address], [place], [place] [address]    Report Status PENDING  Incomplete  Blood culture (routine x 2)     Status: Abnormal   Collection Time: 11/21/18  5:47 PM   Specimen: BLOOD  Result Value Ref Range Status   Specimen Description   Final    BLOOD LEFT ANTECUBITAL Performed at [facility], [address] [place], [place] [address]    Special Requests   Final    BOTTLES DRAWN AEROBIC AND ANAEROBIC Blood Culture results may not be optimal due to an inadequate volume of blood received in culture bottles Performed at [facility], [address], [place], [place] [address]    Culture  Setup Time   Final    GRAM POSITIVE COCCI AEROBIC BOTTLE ONLY CRITICAL RESULT CALLED TO, READ BACK BY AND VERIFIED WITH: PHARMD M BELL [id number] MLM    Culture (A)  Final    STAPHYLOCOCCUS SPECIES (COAGULASE NEGATIVE) THE SIGNIFICANCE OF ISOLATING THIS ORGANISM FROM A SINGLE SET OF BLOOD CULTURES WHEN MULTIPLE SETS ARE DRAWN IS UNCERTAIN. PLEASE NOTIFY THE MICROBIOLOGY DEPARTMENT WITHIN ONE WEEK IF SPECIATION AND SENSITIVITIES ARE REQUIRED. Performed at [facility], [address], [place], [place] [address]     Report Status 11/24/2018 FINAL  Final  Blood Culture ID Panel (Reflexed)     Status: Abnormal   Collection Time: 11/21/18  5:47 PM  Result Value Ref Range Status   Enterococcus species NOT DETECTED NOT DETECTED Final   Listeria monocytogenes NOT DETECTED NOT DETECTED Final   Staphylococcus species DETECTED (A) NOT DETECTED Final    Comment: Methicillin (oxacillin) resistant coagulase negative staphylococcus. Possible blood culture contaminant (unless isolated from more than one blood culture draw or clinical case suggests pathogenicity). No antibiotic treatment is indicated for blood  culture contaminants. CRITICAL RESULT CALLED TO, READ BACK BY AND VERIFIED WITH: PHARMD M BELL [id number] 1616 MLM    Staphylococcus aureus (BCID) NOT DETECTED NOT DETECTED Final   Methicillin resistance DETECTED (A) NOT DETECTED Final    Comment: CRITICAL RESULT CALLED TO, READ BACK BY AND VERIFIED WITH: PHARMD M BELL [id number] MLM    Streptococcus species NOT DETECTED NOT DETECTED Final   Streptococcus agalactiae NOT DETECTED NOT DETECTED Final   Streptococcus pneumoniae NOT DETECTED NOT DETECTED Final   Streptococcus pyogenes NOT DETECTED NOT DETECTED Final   Acinetobacter baumannii NOT DETECTED NOT DETECTED Final   Enterobacteriaceae species NOT DETECTED NOT DETECTED Final   Enterobacter cloacae complex NOT DETECTED NOT DETECTED Final   Escherichia coli NOT DETECTED NOT DETECTED Final   Klebsiella oxytoca NOT DETECTED NOT DETECTED Final   Klebsiella pneumoniae NOT DETECTED NOT DETECTED Final   Proteus species NOT DETECTED NOT DETECTED Final   Serratia marcescens NOT DETECTED NOT DETECTED  Final   Haemophilus influenzae NOT DETECTED NOT DETECTED Final   Neisseria meningitidis NOT DETECTED NOT DETECTED Final   Pseudomonas aeruginosa NOT DETECTED NOT DETECTED Final   Candida albicans NOT DETECTED NOT DETECTED Final   Candida glabrata NOT DETECTED NOT DETECTED Final   Candida krusei NOT DETECTED NOT  DETECTED Final   Candida parapsilosis NOT DETECTED NOT DETECTED Final   Candida tropicalis NOT DETECTED NOT DETECTED Final    Comment: Performed at [facility], [address] [place], [place] [address]  Urine culture     Status: Abnormal   Collection Time: 11/21/18  8:28 PM   Specimen: Urine, Clean Catch  Result Value Ref Range Status   Specimen Description   Final    URINE, CLEAN CATCH Performed at [facility], [address] [place], [place] [address]    Special Requests   Final    NONE Performed at [facility], [address] [place], [place] [address]    Culture >=100,000 COLONIES/mL ENTEROCOCCUS FAECALIS (A)  Final   Report Status 11/24/2018 FINAL  Final   Organism ID, Bacteria ENTEROCOCCUS FAECALIS (A)  Final      Susceptibility   Enterococcus faecalis - MIC*    AMPICILLIN <=2 SENSITIVE Sensitive     LEVOFLOXACIN 0.5 SENSITIVE Sensitive     NITROFURANTOIN <=16 SENSITIVE Sensitive     VANCOMYCIN <=0.5 SENSITIVE Sensitive     * >=100,000 COLONIES/mL ENTEROCOCCUS FAECALIS  SARS Coronavirus 2 [facility](Hospital order, Performed in [facility] hospital lab) Nasopharyngeal Nasopharyngeal Swab     Status: None   Collection Time: 11/21/18  8:28 PM   Specimen: Nasopharyngeal Swab  Result Value Ref Range Status   SARS Coronavirus 2 NEGATIVE NEGATIVE Final    Comment: (NOTE) If result is NEGATIVE SARS-CoV-2 target nucleic acids are NOT DETECTED. The SARS-CoV-2 RNA is generally detectable in upper and lower  respiratory specimens during the acute phase of infection. The lowest  concentration of SARS-CoV-2 viral copies this assay can detect is 250  copies / mL. A negative result does not preclude SARS-CoV-2 infection  and should not be used as the sole basis for treatment or other  patient management decisions.  A negative result may occur with  improper specimen collection / handling, submission of specimen other  than nasopharyngeal swab,  presence of viral mutation(s) within the  areas targeted by this assay, and inadequate number of viral copies  (<250 copies / mL). A negative result must be combined with clinical  observations, patient history, and epidemiological information. If result is POSITIVE SARS-CoV-2 target nucleic acids are DETECTED. The SARS-CoV-2 RNA is generally detectable in upper and lower  respiratory specimens dur ing the acute phase of infection.  Positive  results are indicative of active infection with SARS-CoV-2.  Clinical  correlation with patient history and other diagnostic information is  necessary to determine patient infection status.  Positive results do  not rule out bacterial infection or co-infection with other viruses. If result is PRESUMPTIVE POSTIVE SARS-CoV-2 nucleic acids MAY BE PRESENT.   A presumptive positive result was obtained on the submitted specimen  and confirmed on repeat testing.  While 2019 novel coronavirus  (SARS-CoV-2) nucleic acids may be present in the submitted sample  additional confirmatory testing may be necessary for epidemiological  and / or clinical management purposes  to differentiate between  SARS-CoV-2 and other Sarbecovirus currently known to infect humans.  If clinically indicated additional testing with an alternate test  methodology [id number]) is advised. The SARS-CoV-2 RNA is generally  detectable in upper and lower respiratory sp ecimens during the acute  phase of infection. The expected result is Negative. Fact Sheet for Patients:  StrictlyIdeas.no Fact Sheet for Healthcare Providers: BankingDealers.co.za This test is not yet approved or cleared by the [place] FDA and has been authorized for detection and/or diagnosis of SARS-CoV-2 by FDA under an Emergency Use Authorization (EUA).  This EUA will remain in effect (meaning this test can be used) for the duration of the COVID-19 declaration under  Section 564(b)(1) of the Act, 21 U.S.C. section 360bbb-3(b)(1), unless the authorization is terminated or revoked sooner. Performed at [facility], [place] [address], [place], [place] [address]   Culture, blood (routine x 2)     Status: None (Preliminary result)   Collection Time: 11/22/18  5:18 PM   Specimen: BLOOD  Result Value Ref Range Status   Specimen Description   Final    BLOOD LEFT ANTECUBITAL Performed at [facility] [address], [place], [place] [address]    Special Requests   Final    BOTTLES DRAWN AEROBIC ONLY Blood Culture adequate volume Performed at [facility] [address] [place], [place] [address]    Culture   Final    NO GROWTH 2 DAYS Performed at [facility] [address] [place], [place] [address]    Report Status PENDING  Incomplete  Culture, blood (routine x 2)     Status: None (Preliminary result)   Collection Time: 11/22/18  5:23 PM   Specimen: BLOOD LEFT FOREARM  Result Value Ref Range Status   Specimen Description   Final    BLOOD LEFT FOREARM Performed at [facility], [place] [address] [place], [place] [address]    Special Requests   Final    BOTTLES DRAWN AEROBIC ONLY Blood Culture adequate volume Performed at [place] [address], [place], [place] [address]    Culture   Final    NO GROWTH 2 DAYS Performed at [facility] [address] [place], [place] [address]    Report Status PENDING  Incomplete    Radiology Studies: Dg Chest Port 1 View  Result Date: 11/23/2018 CLINICAL DATA:  Shortness of breath EXAM: PORTABLE CHEST 1 VIEW COMPARISON:  11/21/2018, 10/26/2018 FINDINGS: Suspected tiny pleural effusions. No change in patchy airspace disease at the left base. Stable enlarged cardiomediastinal silhouette with aortic atherosclerosis. No pneumothorax. IMPRESSION: 1. Suspected tiny pleural effusions. 2. No change in airspace disease  at the left base, atelectasis versus pneumonia 3. Mild cardiomegaly Electronically Signed   By: Donavan Foil M.D.   On: 11/23/2018 15:54   Scheduled Meds: . azithromycin  500 mg Oral Q24H  . enoxaparin (LOVENOX) injection  30 mg Subcutaneous Q24H  . ipratropium  0.5 mg Nebulization BID  . levalbuterol  0.63 mg Nebulization BID  . multivitamin with minerals  1 tablet Oral Daily  . pantoprazole  40 mg Oral Daily  . sertraline  25 mg Oral Daily   Continuous Infusions: . sodium chloride 75 mL/hr at 11/24/18 1304  . cefTRIAXone (ROCEPHIN)  IV Stopped (11/23/18 1842)  . potassium PHOSPHATE IVPB (in mmol) 20 mmol (11/24/18 1035)    LOS: 2 days   Kerney Elbe, DO [facility] PAGER is on [place]  If 7PM-7AM, please contact night-coverage [email or web address] Password [email or web address] 11/24/2018, 2:02 PM

## 2018-11-24 NOTE — TOC Progression Note (Signed)
Transition of Care [facility]) - Progression Note    Patient Details  Name: CECILEY BUIST MRN: [id number] Date of Birth: 26-Jul-1926  [facility] CM/SW [place], LCSW Phone Number: 11/24/2018, 3:45 PM  Clinical Narrative:   CSW following patient for support and discharge needs. Patient is under [place] [facility] custody and custody paperwork is on patients chart. CSW and RN spoke with Samule Ohm [phone]) and she is the one getting custody paperwork in order. Reeves Forth stated that patients guardian will be Sharene Skeans [phone]) and Suanne Marker will be the point of contact if patient is needing medical decision to be made. Camilla sated that hospital can either call her or Suanne Marker if patient is needing any decisions to be made. Reeves Forth stated that it is okay if son and spouse visit patient but Reeves Forth was very addiment that "Under no circumstances are We the (hospital) able to discharge patient to son or spouse. Reeves Forth stated that patient will need to go to a rehab facility and she has given CSW verbal permission to start the SNF process. Reeves Forth stated that the neither the son or the spouse have rights to make decisions on patients care. CSW will follow up with Reeves Forth and or Suanne Marker once bed offers are available     Expected Discharge Plan: [place] Barriers to Discharge: Continued Medical Work up  Expected Discharge Plan and Services Expected Discharge Plan: [place] In-house Referral: Clinical Social Work Discharge Planning Services: CM Consult Post Acute Care Choice: [place] Living arrangements for the past 2 months: [place] Expected Discharge Date: 11/24/18                           [facility] Agency: [facility] Date [place]: 11/21/18 Time [place]: 1803 Representative spoke with at [place]: [place] (SDOH) Interventions     Readmission Risk Interventions No flowsheet data found.

## 2018-11-24 NOTE — Progress Notes (Signed)
Initial Nutrition Assessment  DOCUMENTATION CODES:   Underweight  INTERVENTION:  - will order Boost Breeze BID, each supplement provides 250 kcal and 9 grams of protein. - will order Ensure Enlive BID, each supplement provides 350 kcal and 20 grams of protein. - continue to encourage PO intakes and floor staff to provide feeding assistance, if needed.   Monitor magnesium, potassium, and phosphorus daily for at least 3 days, MD to replete as needed, as pt is at risk for refeeding syndrome given underweight, very poor PO intakes, D5 IVF, current hypokalemia and hypophosphatemia.    NUTRITION DIAGNOSIS:   Increased nutrient needs related to acute illness as evidenced by estimated needs.  GOAL:   Patient will meet greater than or equal to 90% of their needs  MONITOR:   PO intake, Supplement acceptance, Labs, Weight trends  REASON FOR ASSESSMENT:   Other (Comment)(underweight)  ASSESSMENT:   85 y.o. female with history of HTN, hearing loss, and possible dementia. She was recently admitted for UTI three weeks ago. She presented to the ED on 9/26 after being found to be hypotensive by Home Health aide. She was found to be hypotensive in the ED. UA was concerning for UTI and CXR showed some infiltrates.  Per flow sheet, patient consumed 20% of breakfast yesterday and 0% of breakfast this AM. Patient sleeping at the time of RD visit with no family/visitors present. Flow sheet indicates that patient is a/o to self only at this time. Spoke with nurse on the floor earlier this afternoon. She reported that patient refused any food for breakfast despite nurse offering patient several options. She reports that patient was also refusing pills/medication, but was willing to drink water and juice and tolerated these items well.   Per chart review, current weight is 88 lb. Weight on 8/31 was 96 lb. This indicates 8 lb weight loss (8.3% body weight) in the past ~1 month; significant for time frame.    Per notes: - hypotension--possibly d/t dehydration - L-sidedPNA--PT/OT consulted and following - hyponatremia--improving - UTI - AKI--improved - normocytic anemia - hyperbilirubinemia - hypokalemia--repletion ordered - hypomagnesemia--resolved - hypophosphatemia--repletion ordered - dementia with behavior disturbances   Labs reviewed; K: 3 mmol/l, BUN: 5 mg/dl, Ca: 8.4 mg/dl, Phos: 2.2 mg/dl. Medications reviewed; 1 g IV Mg sulfate x1 run 9/28, 40 mg oral protonix/day, 40 mg oral protonix/day, 40 mEq K-Dur x1 dose 9/28, 20 mmol IV KPhos x1 run 9/28 IVF; D5-NS-20 mEq IV KCl @ 75 ml/hr (306 kcal, 90 grams dextrose).     NUTRITION - FOCUSED PHYSICAL EXAM:  unable to complete at this time.   Diet Order:   Diet Order            Diet Heart Room service appropriate? Yes; Fluid consistency: Thin  Diet effective now              EDUCATION NEEDS:   No education needs have been identified at this time  Skin:  Skin Assessment: Reviewed RN Assessment  Last BM:  9/28  Height:   Ht Readings from Last 1 Encounters:  11/21/18 5\' 3"  (1.6 m)    Weight:   Wt Readings from Last 1 Encounters:  11/23/18 40 kg    Ideal Body Weight:  52.3 kg  BMI:  Body mass index is 15.62 kg/m.  Estimated Nutritional Needs:   Kcal:  1400-1600 kcal  Protein:  65-75 grams  Fluid:  >/= 1.6 L/day      Jarome Matin, MS, RD, LDN, CNSC Inpatient  Clinical Dietitian Pager # [phone] After hours/weekend pager # [phone]

## 2018-11-24 NOTE — Progress Notes (Signed)
Pt's HR on telemetry noted to be 150-170's maintaining. Pt asymptomatic. EKG obtained showing NSR with PAC's, HR 90's-100's. Dr. Alfredia Ferguson made aware. Will continue to monitor.

## 2018-11-24 NOTE — Progress Notes (Signed)
Pt RR running high . Mews red. Pt fast asleep RR reading 20-30. RR lowers depending on Pt position. Repositioned will continue to monitor.

## 2018-11-24 NOTE — NC FL2 (Signed)
[place] MEDICAID FL2 LEVEL OF CARE SCREENING TOOL     IDENTIFICATION  Patient Name: Lindsey Norris Birthdate: 03-13-1926 Sex: female Admission Date (Current Location): 11/21/2018  [facility] and [place] Number:  Producer, television/film/video and Address:  [facility],  [address], [place] [address]      Provider Number: [id number]  Attending Physician Name and Address:  Merlene Laughter, DO  Relative Name and Phone Number:  Allyn Kenner [phone]) Meriam Sprague [phone]    Current Level of Care: Hospital Recommended Level of Care: Skilled Nursing Facility Prior Approval Number:    Date Approved/Denied:   PASRR Number: [id number]  Discharge Plan: SNF    Current Diagnoses: Patient Active Problem List   Diagnosis Date Noted  . Hypotension 11/21/2018  . Weakness 10/27/2018  . Hypokalemia 10/27/2018  . UTI (urinary tract infection) 10/27/2018  . Hyponatremia 10/27/2018  . Seizures (HCC) 04/25/2017  . Syncope 04/25/2017  . Altered mental status 04/25/2017  . Arthritis   . Seasonal and perennial allergic rhinitis 05/09/2010  . Hyperlipemia 06/11/2007  . Hypertension 06/11/2007  . HEMORRHOIDS 06/11/2007  . COPD, MILD 06/11/2007  . OSTEOARTHRITIS 06/11/2007  . Personal history of colonic polyps 01/15/2007  . Asthma, mild intermittent, well-controlled 01/15/2007  . GERD 01/15/2007  . GASTRITIS, CHRONIC 04/22/2001    Orientation RESPIRATION BLADDER Height & Weight     Self  Normal Incontinent, External catheter Weight: 88 lb 2.9 oz (40 kg) Height:  5\' 3"  (160 cm)  BEHAVIORAL SYMPTOMS/MOOD NEUROLOGICAL BOWEL NUTRITION STATUS      Incontinent Diet(heart healthy diet)  AMBULATORY STATUS COMMUNICATION OF NEEDS Skin   Extensive Assist Verbally Bruising(arms)                       Personal Care Assistance Level of Assistance  Bathing, Feeding, Dressing Bathing Assistance: Maximum assistance Feeding assistance: Limited  assistance Dressing Assistance: Maximum assistance     Functional Limitations Info  Sight, Hearing, Speech Sight Info: Impaired(glasses) Hearing Info: Impaired Speech Info: Adequate    SPECIAL CARE FACTORS FREQUENCY  PT (By licensed PT), OT (By licensed OT)     PT Frequency: 5x wk OT Frequency: 5x wk            Contractures Contractures Info: Not present    Additional Factors Info  Code Status, Allergies Code Status Info: Full Code Allergies Info: Sulfanomide Derivatives           Current Medications (11/24/2018):  This is the current hospital active medication list Current Facility-Administered Medications  Medication Dose Route Frequency Provider Last Rate Last Dose  . acetaminophen (TYLENOL) tablet 650 mg  650 mg Oral Q6H PRN 11/26/2018, MD       Or  . acetaminophen (TYLENOL) suppository 650 mg  650 mg Rectal Q6H PRN Eduard Clos, MD      . amoxicillin-clavulanate (AUGMENTIN) 500-125 MG per tablet 500 mg  1 tablet Oral BID WC Sheikh, Omair Latif, DO      . azithromycin [facility]) tablet 500 mg  500 mg Oral Q24H [facility], [facility]   500 mg at 11/23/18 2116  . dextrose 5 % and 0.9 % NaCl with KCl 20 mEq/L infusion   Intravenous Continuous 2117 Cornelius, DO 75 mL/hr at 11/24/18 1537    . enoxaparin (LOVENOX) injection 30 mg  30 mg Subcutaneous Q24H 11/26/18, MD   30 mg at 11/24/18 11/26/18  . feeding supplement (BOOST /  RESOURCE BREEZE) liquid 1 Container  1 Container Oral BID BM Sheikh, Georgina Quint [place], DO   1 Container at 11/24/18 1538  . feeding supplement (ENSURE ENLIVE) (ENSURE ENLIVE) liquid 237 mL  237 mL Oral BID BM Sheikh, Omair Latif, DO      . ipratropium (ATROVENT) nebulizer solution 0.5 mg  0.5 mg Nebulization BID Sheikh, Omair Latif, DO      . levalbuterol [facility]) nebulizer solution 0.63 mg  0.63 mg Nebulization BID Sheikh, Omair Latif, DO      . multivitamin with minerals tablet 1 tablet  1 tablet Oral Daily Rise Patience, MD   1 tablet at 11/24/18 [phone]  . ondansetron (ZOFRAN) tablet 4 mg  4 mg Oral Q6H PRN Rise Patience, MD       Or  . ondansetron [facility]) injection 4 mg  4 mg Intravenous Q6H PRN Rise Patience, MD      . pantoprazole (PROTONIX) EC tablet 40 mg  40 mg Oral Daily Rise Patience, MD   40 mg at 11/24/18 [phone]  . potassium PHOSPHATE 20 mmol in dextrose 5 % 500 mL infusion  20 mmol Intravenous Once Raiford Noble Latif, DO 84 mL/hr at 11/24/18 1035 20 mmol at 11/24/18 1035  . sertraline (ZOLOFT) tablet 25 mg  25 mg Oral Daily Raiford Noble [place], DO   25 mg at 11/24/18 2947     Discharge Medications: Please see discharge summary for a list of discharge medications.  Relevant Imaging Results:  Relevant Lab Results:   Additional Information SS#: [id number] patient is now under APS guardianship  Wende Neighbors, LCSW

## 2018-11-24 NOTE — Plan of Care (Signed)
  Problem: Urinary Elimination: Goal: Signs and symptoms of infection will decrease Outcome: Progressing   Problem: Urinary Elimination: Goal: Signs and symptoms of infection will decrease Outcome: Progressing   Problem: Education: Goal: Knowledge of General Education information will improve Description: Including pain rating scale, medication(s)/side effects and non-pharmacologic comfort measures Outcome: Progressing   Problem: Clinical Measurements: Goal: Ability to maintain clinical measurements within normal limits will improve Outcome: Progressing Goal: Will remain free from infection Outcome: Progressing Goal: Diagnostic test results will improve Outcome: Progressing Goal: Respiratory complications will improve Outcome: Progressing Goal: Cardiovascular complication will be avoided Outcome: Progressing   Problem: Activity: Goal: Risk for activity intolerance will decrease Outcome: Progressing   Problem: Nutrition: Goal: Adequate nutrition will be maintained Outcome: Not Progressing   Problem: Coping: Goal: Level of anxiety will decrease Outcome: Progressing   Problem: Elimination: Goal: Will not experience complications related to bowel motility Outcome: Completed/Met Goal: Will not experience complications related to urinary retention Outcome: Completed/Met   Problem: Pain Managment: Goal: General experience of comfort will improve Outcome: Completed/Met   Problem: Safety: Goal: Ability to remain free from injury will improve Outcome: Progressing   Problem: Skin Integrity: Goal: Risk for impaired skin integrity will decrease Outcome: Progressing

## 2018-11-25 ENCOUNTER — Inpatient Hospital Stay (HOSPITAL_COMMUNITY): Payer: Medicare Other

## 2018-11-25 DIAGNOSIS — F03918 Unspecified dementia, unspecified severity, with other behavioral disturbance: Secondary | ICD-10-CM

## 2018-11-25 DIAGNOSIS — I471 Supraventricular tachycardia: Secondary | ICD-10-CM

## 2018-11-25 DIAGNOSIS — Z515 Encounter for palliative care: Secondary | ICD-10-CM

## 2018-11-25 DIAGNOSIS — Z7189 Other specified counseling: Secondary | ICD-10-CM

## 2018-11-25 DIAGNOSIS — F0391 Unspecified dementia with behavioral disturbance: Secondary | ICD-10-CM

## 2018-11-25 DIAGNOSIS — I700 Atherosclerosis of aorta: Secondary | ICD-10-CM

## 2018-11-25 LAB — COMPREHENSIVE METABOLIC PANEL
ALT: 12 U/L (ref 0–44)
AST: 19 U/L (ref 15–41)
Albumin: 2.9 g/dL — ABNORMAL LOW (ref 3.5–5.0)
Alkaline Phosphatase: 49 U/L (ref 38–126)
Anion gap: 7 (ref 5–15)
BUN: 6 mg/dL — ABNORMAL LOW (ref 8–23)
CO2: 23 mmol/L (ref 22–32)
Calcium: 8.2 mg/dL — ABNORMAL LOW (ref 8.9–10.3)
Chloride: 105 mmol/L (ref 98–111)
Creatinine, Ser: 0.59 mg/dL (ref 0.44–1.00)
GFR calc Af Amer: >60 mL/min (ref >60)
GFR calc non Af Amer: >60 mL/min (ref >60)
Glucose, Bld: 118 mg/dL — ABNORMAL HIGH (ref 70–99)
Potassium: 3.6 mmol/L (ref 3.5–5.1)
Sodium: 135 mmol/L (ref 135–145)
Total Bilirubin: 0.7 mg/dL (ref 0.3–1.2)
Total Protein: 5.3 g/dL — ABNORMAL LOW (ref 6.5–8.1)

## 2018-11-25 LAB — CBC WITH DIFFERENTIAL/PLATELET
Abs Immature Granulocytes: 0.03 10*3/uL (ref 0.00–0.07)
Basophils Absolute: 0.1 10*3/uL (ref 0.0–0.1)
Basophils Relative: 1 %
Eosinophils Absolute: 0.2 10*3/uL (ref 0.0–0.5)
Eosinophils Relative: 3 %
HCT: 40.8 % (ref 36.0–46.0)
Hemoglobin: 12.8 g/dL (ref 12.0–15.0)
Immature Granulocytes: 0 %
Lymphocytes Relative: 22 %
Lymphs Abs: 1.9 10*3/uL (ref 0.7–4.0)
MCH: 29.2 pg (ref 26.0–34.0)
MCHC: 31.4 g/dL (ref 30.0–36.0)
MCV: 92.9 fL (ref 80.0–100.0)
Monocytes Absolute: 0.8 10*3/uL (ref 0.1–1.0)
Monocytes Relative: 10 %
Neutro Abs: 5.4 10*3/uL (ref 1.7–7.7)
Neutrophils Relative %: 64 %
Platelets: 251 10*3/uL (ref 150–400)
RBC: 4.39 MIL/uL (ref 3.87–5.11)
RDW: 15.1 % (ref 11.5–15.5)
WBC: 8.4 10*3/uL (ref 4.0–10.5)
nRBC: 0 % (ref 0.0–0.2)

## 2018-11-25 LAB — PHOSPHORUS: Phosphorus: 2.3 mg/dL — ABNORMAL LOW (ref 2.5–4.6)

## 2018-11-25 LAB — MAGNESIUM: Magnesium: 1.6 mg/dL — ABNORMAL LOW (ref 1.7–2.4)

## 2018-11-25 MED ORDER — OLANZAPINE 5 MG PO TABS
2.5000 mg | ORAL_TABLET | Freq: Every day | ORAL | Status: DC
  Administered 2018-11-25 – 2018-12-04 (×10): 2.5 mg via ORAL
  Filled 2018-11-25 (×10): qty 1

## 2018-11-25 MED ORDER — POTASSIUM PHOSPHATES 15 MMOLE/5ML IV SOLN
20.0000 mmol | Freq: Once | INTRAVENOUS | Status: AC
  Administered 2018-11-25: 20 mmol via INTRAVENOUS
  Filled 2018-11-25: qty 6.67

## 2018-11-25 MED ORDER — AMIODARONE HCL IN DEXTROSE 360-4.14 MG/200ML-% IV SOLN
30.0000 mg/h | INTRAVENOUS | Status: DC
  Administered 2018-11-25 – 2018-11-26 (×2): 30 mg/h via INTRAVENOUS
  Filled 2018-11-25 (×2): qty 200

## 2018-11-25 MED ORDER — AMIODARONE LOAD VIA INFUSION
150.0000 mg | Freq: Once | INTRAVENOUS | Status: AC
  Administered 2018-11-25: 150 mg via INTRAVENOUS
  Filled 2018-11-25: qty 83.34

## 2018-11-25 MED ORDER — AMIODARONE HCL IN DEXTROSE 360-4.14 MG/200ML-% IV SOLN
60.0000 mg/h | INTRAVENOUS | Status: AC
  Administered 2018-11-25: 60 mg/h via INTRAVENOUS
  Filled 2018-11-25: qty 200

## 2018-11-25 MED ORDER — MAGNESIUM SULFATE 2 GM/50ML IV SOLN
2.0000 g | Freq: Once | INTRAVENOUS | Status: AC
  Administered 2018-11-25: 2 g via INTRAVENOUS
  Filled 2018-11-25: qty 50

## 2018-11-25 NOTE — Progress Notes (Signed)
PROGRESS NOTE    Lindsey Norris  ZOX:[id number] DOB: September 16, 1926 DOA: 11/21/2018 PCP: Gaspar Garbe, MD   Brief Narrative: HPI per Dr. Midge Minium on 11/21/2018 Lindsey Norris is a 85 y.o. female with history of hypertension, hearing loss possible dementia recently admitted for UTI 3 weeks ago was brought to the ER after patient was found to be hypotensive.  Per report provided by the ER physician as patient is confused and at this time patient's care has been taken over by state since patient's husband was refusing to transfer patient to the ER patient was found to be hypotensive by patient's home health aide and EMS was called yesterday but patient has been refused transfer.  Again today patient was visited by the home health aide again found to be hypotensive at this time state has taken the custody of patient's care and patient was transferred to ER.  ED Course: In the ER patient was hypotensive with a blood pressure in the 80s was given fluid bolus.  Labs show increased creatinine from baseline of 1.48 3 weeks ago weights around 1.1 with sodium of 130 UA concerning for UTI and chest x-ray showing some infiltrates.  Blood cultures urine cultures were obtained for sepsis work-up and started on empiric antibiotics.  COVID-19 test was negative.  WBC count was 10 and hemoglobin was 13.5.  EKG shows normal sinus rhythm.  **Interim History  She was somnolent and had received Haldol and lorazepam as her agitation during a few nights ago and She cannot cannot provide a meaningful subjective history to me today but is just stating "Don't leave me" or "Help me".  Hypotension is improving.  PT OT recommending skilled nursing facility. Hospitalization complicated by intermittent bouts of SVT that has not been sustaining so Cardiology was consulted for further evaluation and Recommendations. Palliative consulted for further GOC Discussion and assistance with APS situation.   Assessment &  Plan:   Principal Problem:   Hypotension Active Problems:   Hypertension   UTI (urinary tract infection)   Hypotension likely could be from Dehydration in the setting of suspected pneumonia. Improving -Does not appear to be septic.   -However given that patient has UTI and possible pneumonia patient has been placed on empiric antibiotics and follow cultures.  -Given 1 Liter bolus in the ED and will repeat 500 mL Bolus  -Gently Hydrate with NS at 75 mL/hr and will continue for another 24 hours but this is now been stopped and patient was started on D5 normal saline +20 mEq of KCl at a rate of 75 mL's per hour given her poor appetite and p.o. intake -BP remained on the lower side but is now improved to 142/97 -Will need a PT/OT Evaluation for placement; per my discussion with social work patient cannot go anywhere until she is placed due to her being under APS custody now -APS evaluated to evaluate the home setting and the decision was to obtain an emergency court order and patient is now under state custody -Repeat chest x-ray this a.m. showed tiny pleural effusions and no change in airspace disease at the left base with atelectasis versus a pneumonia and showed mild cardiomegaly -Continue to monitor and trend blood pressures per protocol -Added Xopenex and Atrovent twice daily given her rhonchi and she is improving -Strep pneumo urinary antigen and Legionella Pneumophilia Serogp 1 Ur Ag Negative -Continue with empiric antibiotics were changed to cover enterococcus for the urine as well as the pneumonia to Augmentin for  3 days total (Day 2/3)   Hyponatremia, improved -Patient's Sodium on admission was 130 and ? In the setting of HCTZ -Given 1.5 boluses and started on maintenance IV fluid and will be continued throughout the day and renewed for D5 normal saline at 75 mL's per hour -Continue to Monitor and Trend and now sodium is 135 -Repeat CMP in AM   Enterococcus UTI versus colonization  with compounding likely dementia  -Recently had a UTI 3 weeks ago -Cx's at that time showed Multiple Species Present -Urinalysis this admission showed large leukocytes, rare bacteria, 11-20 WBCs -Urine culture still pending and was reintubated for better growth but is now showing Enterococcus faecalis with greater than 100,000 colonies -Enterococcus was pansensitive.  Change antibiotics to cover Enterococcus and Pneumonia and will change to Augmentin for 3 days -Blood cultures x2 will be repeated given possible contamination but did show Staphylococcus and gram-positive cocci -Patient is on antibiotics for Pneumonia coverage and will change to cover enterococcus and now on Augmentin for 3 Days total -Continue to Monitor and Trend Cx's and blood cultures have been repeated as above -Patient is much more awake and alert today   Left Basilar Pneumonia, poA -CXR showed "Stable cardiomediastinal silhouette. Atherosclerosis of thoracic aorta is noted. No pneumothorax is noted. No pleural effusion is noted. Right lung is clear. Mild left basilar atelectasis or infiltrate is noted. Bony thorax is unremarkable." -Repeat CXR showed "Suspected tiny pleural effusions. No change in airspace disease at the left base, atelectasis versus pneumonia Mild cardiomegaly." -Antibiotics with IV Ceftriaxone and Azithromycin changed to Augmentin for 3 more Days  -Also added Xopenex/Atrovent every 6 hours scheduled -Will order Flutter Valve, Incentive Spirometry, and Guaifenesin   AKI, improved Elevated anion gap -AG is now 7 -likely could be from dehydration and HCTZ -IVF resumed for today at D5 normal saline +20 mEq of KCl at 75 mils per hour and BUN/creatinine is now 6/0.59 -Avoid Nephrotoxic Medications, Contrast Dyes, and Hypotension if possible -Continue to monitor and trend renal function -Repeat CMP in a.m.  History of Hypertension  -Continue hold Hydrochlorothiazide due to low normal blood pressure and  dehydration and renal failure. -Patient blood pressure now 142/97  History of Depression -C/w Sertraline 25 mg po Daily   Normocytic Anemia -Patient is hemoglobin/hematocrit was 11.7/36.5 and likely had a dilutional drop from admission given her gentle IV fluid hydration; now hemoglobin/hematocrit is now 12.8/40.8 -Checked Anemia Panel showed an iron level 63, U IBC 115, TIBC 178, saturation ratio 35%, ferritin level 95, and vitamin B12 771 -Continue to monitor for signs and symptoms of bleeding; currently no overt bleeding noted -Repeat CBC in a.m.  Hyperbilirubinemia -Patient's T Bili was 1.5 -Now trended down and is now 0.7 -Continue to Monitor and Trend and Repeat CMP in AM  Hypokalemia -Patient's K+ was 3.6 this a.m. -Replete with IV K-Phos 20 mmol again -Continue to Monitor and Replete as Necessary -Repeat CMP in AM   Hypomagnesemia -Patient magnesium level this morning is 1.6 -Replete with IV mag sulfate 2 Grams -Continue to monitor replete as necessary -Repeat magnesium level in a.m.  Hypophosphatemia -Patient's phosphorus level this morning was 2.2 -Replete with IV K-Phos 20 mmol -Continue to monitor and replete as necessary -Repeat phosphorus level in a.m.  1 out of 4 positive blood culture -Possibly contaminant -Shows staphylococcal species assistant -Repeat blood cultures and if necessary will then change antibiotics in place on IV vancomycin however feel this is a contaminant; antibiotics were changed as above -  Repeat blood culture showed no growth to date at 3 days  Moderate Dementia with Behavior Disturbances -Started on Sertraline 25 mg po Daily per Neurology for Mood Stabilization -Given Haldol and Lorazepam by Night Coverage and will not give anymore -Palliative Care Consulted for GOC Discussion and Family Situation given recent APS involvement -Delirium Precautions -Safety Sitter If able   Intermittent SVT -Patient has had bursts of SVT and pulled  back down to normal sinus rhythm but has been having SVT frequently -Replete electrolytes as above -Continue to monitor carefully on telemetry -Consulted Cardiology for further evaluation recommendations and Dr. Teodoro Spray thinks this is suggestive of pulmonary vein tachycardia with brief episodes of possible atrial fibrillation. -Do not feel that she is a good candidate for chronic anticoagulation so he states that she does not make sense to put her on anything but DVT prophylaxis currently -Because of her erratic blood pressure he is choosing to use IV Amiodarone gtt for rate and rhythm control and if blood pressures get better he will try diltiazem or beta-blocker -He is repeating echocardiogram currently  DVT prophylaxis: Enoxaparin 30 mg sq q24h Code Status: FULL CODE  Family Communication: No family present at bedside but spoke to patient's husband at length this morning Disposition Plan: SNF with assistance with social worker and placement via APS  Consultants:   Palliative Care  Cardiology  Procedures:  ECHOCARDIOGRAM   Antimicrobials:  Anti-infectives (From admission, onward)   Start     Dose/Rate Route Frequency Ordered Stop   11/24/18 1700  amoxicillin-clavulanate (AUGMENTIN) 500-125 MG per tablet 500 mg     1 tablet Oral 2 times daily with meals 11/24/18 1419 11/27/18 1659   11/23/18 1800  azithromycin (ZITHROMAX) tablet 500 mg     500 mg Oral Every 24 hours 11/23/18 1616 11/26/18 1759   11/22/18 1800  cefTRIAXone (ROCEPHIN) 2 g in sodium chloride 0.9 % 100 mL IVPB  Status:  Discontinued     2 g 200 mL/hr over 30 Minutes Intravenous Every 24 hours 11/22/18 0143 11/24/18 1419   11/22/18 1800  azithromycin (ZITHROMAX) 500 mg in sodium chloride 0.9 % 250 mL IVPB  Status:  Discontinued     500 mg 250 mL/hr over 60 Minutes Intravenous Every 24 hours 11/22/18 0143 11/23/18 1614   11/21/18 1930  cefTRIAXone (ROCEPHIN) 1 g in sodium chloride 0.9 % 100 mL IVPB     1 g 200 mL/hr  over 30 Minutes Intravenous  Once 11/21/18 1917 11/21/18 2106   11/21/18 1930  azithromycin (ZITHROMAX) 500 mg in sodium chloride 0.9 % 250 mL IVPB     500 mg 250 mL/hr over 60 Minutes Intravenous  Once 11/21/18 1917 11/22/18 0005     Subjective: Seen and examined at bedside and she was a little bit agitated getting cleaned up by the nurse.  While I was in the room with her she had a bowel movement.  Complaining to the nursing and stating "help me" or "do not leave me."  Patient is pleasantly demented and unable to provide subjective history.  Continues to have intermittent bouts of SVT with heart rates in the 190s so cardiology was consulted for formal evaluation.  Palliative involved for goals of care discussion and attempting to help with the APS situation.  Vitals:   11/24/18 2128 11/24/18 2146 11/25/18 0543 11/25/18 0757  BP:  (!) 142/84 (!) 142/97   Pulse:  (!) 110 93   Resp:  18 18   Temp:  97.9 F (36.6  C) 98.2 F (36.8 C)   TempSrc:  Oral Oral   SpO2: 97% 97% 95% 96%  Weight:      Height:        Intake/Output Summary (Last 24 hours) at 11/25/2018 1259 Last data filed at 11/25/2018 0600 Gross per 24 hour  Intake 2230.22 ml  Output 700 ml  Net 1530.22 ml   Filed Weights   11/21/18 1722 11/23/18 0443  Weight: 43.4 kg 40 kg   Examination: Physical Exam:  Constitutional: Thin elderly demented Caucasian female currently unable to provide a subjective history again and appears slightly agitated being cleaned up by the nurse and mildly uncomfortable, Eyes:  Lids and conjunctivae normal, sclerae anicteric  ENMT: External Ears, Nose appear normal. Grossly normal hearing.  Neck: Appears normal, supple, no cervical masses, normal ROM, no appreciable thyromegaly; no JVD Respiratory: Diminished to auscultation bilaterally, no wheezing, rales, rhonchi or crackles. Normal respiratory effort and patient is not tachypenic. No accessory muscle use.  Cardiovascular: RRR with frequent  PAC's, no murmurs / rubs / gallops. S1 and S2 auscultated. No extremity edema. Abdomen: Soft, non-tender, non-distended. No masses palpated. No appreciable hepatosplenomegaly. Bowel sounds positive on a limited skin evaluation GU: Deferred. Musculoskeletal: No clubbing / cyanosis of digits/nails. No joint deformity upper and lower extremities.  Skin: No rashes, lesions, ulcers on a limited skin evaluation. No induration; Warm and dry.  Neurologic: CN 2-12 grossly intact with no focal deficits. Romberg sign and cerebellar reflexes not assessed.  Psychiatric: Impaired judgment and insight.  Appears very anxious and mildly agitated  Data Reviewed: I have personally reviewed following labs and imaging studies  CBC: Recent Labs  Lab 11/21/18 1747 11/22/18 0144 11/22/18 0501 11/23/18 0350 11/24/18 0423 11/25/18 0405  WBC 10.0 7.4 8.0 10.1 10.4 8.4  NEUTROABS 6.1  --   --  7.5 7.9* 5.4  HGB 13.5 11.7* 11.7* 12.2 13.9 12.8  HCT 40.8 37.0 36.5 38.9 44.1 40.8  MCV 90.5 93.2 92.6 93.7 92.3 92.9  PLT 305 221 221 225 255 251   Basic Metabolic Panel: Recent Labs  Lab 11/21/18 1747 11/22/18 0144 11/22/18 0501 11/23/18 0350 11/24/18 0423 11/25/18 0405  NA 130*  --  136 138 136 135  K 4.3  --  3.2* 3.5 3.0* 3.6  CL 92*  --  102 106 98 105  CO2 27  --  25 23 22 23   GLUCOSE 101*  --  79 102* 108* 118*  BUN 20  --  13 9 5* 6*  CREATININE 1.13* 0.83 0.81 0.70 0.53 0.59  CALCIUM 9.1  --  8.4* 8.4* 8.4* 8.2*  MG  --   --  2.1 1.6* 1.7 1.6*  PHOS  --   --  3.3 2.9 2.2* 2.3*   GFR: Estimated Creatinine Clearance: 29.5 mL/min (by C-G formula based on SCr of 0.59 mg/dL). Liver Function Tests: Recent Labs  Lab 11/21/18 1747 11/22/18 0501 11/23/18 0350 11/24/18 0423 11/25/18 0405  AST 39 23 23 22 19   ALT 13 14 15 16 12   ALKPHOS 60 49 50 60 49  BILITOT 1.5* 0.7 0.5 0.7 0.7  PROT 6.6 5.2* 5.2* 6.3* 5.3*  ALBUMIN 3.8 2.9* 3.0* 3.3* 2.9*   No results for input(s): LIPASE, AMYLASE in the  last 168 hours. No results for input(s): AMMONIA in the last 168 hours. Coagulation Profile: No results for input(s): INR, PROTIME in the last 168 hours. Cardiac Enzymes: No results for input(s): CKTOTAL, CKMB, CKMBINDEX, TROPONINI in the last 168  hours. BNP (last 3 results) No results for input(s): PROBNP in the last 8760 hours. HbA1C: No results for input(s): HGBA1C in the last 72 hours. CBG: Recent Labs  Lab 11/22/18 1209  GLUCAP 80   Lipid Profile: No results for input(s): CHOL, HDL, LDLCALC, TRIG, CHOLHDL, LDLDIRECT in the last 72 hours. Thyroid Function Tests: No results for input(s): TSH, T4TOTAL, FREET4, T3FREE, THYROIDAB in the last 72 hours. Anemia Panel: Recent Labs    11/22/18 1515  FOLATE 12.9   Sepsis Labs: Recent Labs  Lab 11/21/18 1746 11/21/18 2028  LATICACIDVEN 1.4 1.0    Recent Results (from the past 240 hour(s))  Blood culture (routine x 2)     Status: None (Preliminary result)   Collection Time: 11/21/18  5:46 PM   Specimen: BLOOD RIGHT ARM  Result Value Ref Range Status   Specimen Description   Final    BLOOD RIGHT ARM Performed at [facility], [address], [place], [place] [address]    Special Requests   Final    BOTTLES DRAWN AEROBIC AND ANAEROBIC Blood Culture adequate volume Performed at [facility], [address] [place], [place] [address]    Culture   Final    NO GROWTH 4 DAYS Performed at [facility], [address], [place], [place] [address]    Report Status PENDING  Incomplete  Blood culture (routine x 2)     Status: Abnormal   Collection Time: 11/21/18  5:47 PM   Specimen: BLOOD  Result Value Ref Range Status   Specimen Description   Final    BLOOD LEFT ANTECUBITAL Performed at [facility], [address] [place], [place] [address]    Special Requests   Final    BOTTLES DRAWN AEROBIC AND ANAEROBIC Blood Culture results may not be optimal due to an  inadequate volume of blood received in culture bottles Performed at [facility], [address] [place], [place] [address]    Culture  Setup Time   Final    GRAM POSITIVE COCCI AEROBIC BOTTLE ONLY CRITICAL RESULT CALLED TO, READ BACK BY AND VERIFIED WITH: PHARMD M BELL [id number] MLM    Culture (A)  Final    STAPHYLOCOCCUS SPECIES (COAGULASE NEGATIVE) THE SIGNIFICANCE OF ISOLATING THIS ORGANISM FROM A SINGLE SET OF BLOOD CULTURES WHEN MULTIPLE SETS ARE DRAWN IS UNCERTAIN. PLEASE NOTIFY THE MICROBIOLOGY DEPARTMENT WITHIN ONE WEEK IF SPECIATION AND SENSITIVITIES ARE REQUIRED. Performed at [facility], [address], [place], [place] [address]    Report Status 11/24/2018 FINAL  Final  Blood Culture ID Panel (Reflexed)     Status: Abnormal   Collection Time: 11/21/18  5:47 PM  Result Value Ref Range Status   Enterococcus species NOT DETECTED NOT DETECTED Final   Listeria monocytogenes NOT DETECTED NOT DETECTED Final   Staphylococcus species DETECTED (A) NOT DETECTED Final    Comment: Methicillin (oxacillin) resistant coagulase negative staphylococcus. Possible blood culture contaminant (unless isolated from more than one blood culture draw or clinical case suggests pathogenicity). No antibiotic treatment is indicated for blood  culture contaminants. CRITICAL RESULT CALLED TO, READ BACK BY AND VERIFIED WITH: PHARMD M BELL [id number] MLM    Staphylococcus aureus (BCID) NOT DETECTED NOT DETECTED Final   Methicillin resistance DETECTED (A) NOT DETECTED Final    Comment: CRITICAL RESULT CALLED TO, READ BACK BY AND VERIFIED WITH: PHARMD M BELL [id number] MLM    Streptococcus species NOT  DETECTED NOT DETECTED Final   Streptococcus agalactiae NOT DETECTED NOT DETECTED Final   Streptococcus pneumoniae NOT DETECTED NOT DETECTED Final   Streptococcus pyogenes NOT DETECTED NOT DETECTED Final   Acinetobacter baumannii NOT DETECTED NOT DETECTED Final    Enterobacteriaceae species NOT DETECTED NOT DETECTED Final   Enterobacter cloacae complex NOT DETECTED NOT DETECTED Final   Escherichia coli NOT DETECTED NOT DETECTED Final   Klebsiella oxytoca NOT DETECTED NOT DETECTED Final   Klebsiella pneumoniae NOT DETECTED NOT DETECTED Final   Proteus species NOT DETECTED NOT DETECTED Final   Serratia marcescens NOT DETECTED NOT DETECTED Final   Haemophilus influenzae NOT DETECTED NOT DETECTED Final   Neisseria meningitidis NOT DETECTED NOT DETECTED Final   Pseudomonas aeruginosa NOT DETECTED NOT DETECTED Final   Candida albicans NOT DETECTED NOT DETECTED Final   Candida glabrata NOT DETECTED NOT DETECTED Final   Candida krusei NOT DETECTED NOT DETECTED Final   Candida parapsilosis NOT DETECTED NOT DETECTED Final   Candida tropicalis NOT DETECTED NOT DETECTED Final    Comment: Performed at [facility], [address] [place], [place] [address]  Urine culture     Status: Abnormal   Collection Time: 11/21/18  8:28 PM   Specimen: Urine, Clean Catch  Result Value Ref Range Status   Specimen Description   Final    URINE, CLEAN CATCH Performed at [facility], [address], [place], [place] [address]    Special Requests   Final    NONE Performed at [facility], [address] [place], [place] [address]    Culture >=100,000 COLONIES/mL ENTEROCOCCUS FAECALIS (A)  Final   Report Status 11/24/2018 FINAL  Final   Organism ID, Bacteria ENTEROCOCCUS FAECALIS (A)  Final      Susceptibility   Enterococcus faecalis - MIC*    AMPICILLIN <=2 SENSITIVE Sensitive     LEVOFLOXACIN 0.5 SENSITIVE Sensitive     NITROFURANTOIN <=16 SENSITIVE Sensitive     VANCOMYCIN <=0.5 SENSITIVE Sensitive     * >=100,000 COLONIES/mL ENTEROCOCCUS FAECALIS  SARS Coronavirus 2 [facility] order, Performed in [facility] hospital lab) Nasopharyngeal Nasopharyngeal Swab     Status: None   Collection Time: 11/21/18  8:28 PM    Specimen: Nasopharyngeal Swab  Result Value Ref Range Status   SARS Coronavirus 2 NEGATIVE NEGATIVE Final    Comment: (NOTE) If result is NEGATIVE SARS-CoV-2 target nucleic acids are NOT DETECTED. The SARS-CoV-2 RNA is generally detectable in upper and lower  respiratory specimens during the acute phase of infection. The lowest  concentration of SARS-CoV-2 viral copies this assay can detect is 250  copies / mL. A negative result does not preclude SARS-CoV-2 infection  and should not be used as the sole basis for treatment or other  patient management decisions.  A negative result may occur with  improper specimen collection / handling, submission of specimen other  than nasopharyngeal swab, presence of viral mutation(s) within the  areas targeted by this assay, and inadequate number of viral copies  (<250 copies / mL). A negative result must be combined with clinical  observations, patient history, and epidemiological information. If result is POSITIVE SARS-CoV-2 target nucleic acids are DETECTED. The SARS-CoV-2 RNA is generally detectable in upper and lower  respiratory specimens dur ing the acute phase of infection.  Positive  results are indicative of active infection with SARS-CoV-2.  Clinical  correlation with patient history and other diagnostic information is  necessary to determine patient  infection status.  Positive results do  not rule out bacterial infection or co-infection with other viruses. If result is PRESUMPTIVE POSTIVE SARS-CoV-2 nucleic acids MAY BE PRESENT.   A presumptive positive result was obtained on the submitted specimen  and confirmed on repeat testing.  While 2019 novel coronavirus  (SARS-CoV-2) nucleic acids may be present in the submitted sample  additional confirmatory testing may be necessary for epidemiological  and / or clinical management purposes  to differentiate between  SARS-CoV-2 and other Sarbecovirus currently known to infect humans.  If  clinically indicated additional testing with an alternate test  methodology [phone] is advised. The SARS-CoV-2 RNA is generally  detectable in upper and lower respiratory sp ecimens during the acute  phase of infection. The expected result is Negative. Fact Sheet for Patients:  BoilerBrush.com.cyhttps://www.fda.gov/media/136312/download Fact Sheet for Healthcare Providers: https://pope.com/https://www.fda.gov/media/136313/download This test is not yet approved or cleared by the [place] FDA and has been authorized for detection and/or diagnosis of SARS-CoV-2 by FDA under an Emergency Use Authorization (EUA).  This EUA will remain in effect (meaning this test can be used) for the duration of the COVID-19 declaration under Section 564(b)(1) of the Act, 21 U.S.C. section 360bbb-3(b)(1), unless the authorization is terminated or revoked sooner. Performed at [facility], [address] [place], [place] [address]   Culture, blood (routine x 2)     Status: None (Preliminary result)   Collection Time: 11/22/18  5:18 PM   Specimen: BLOOD  Result Value Ref Range Status   Specimen Description   Final    BLOOD LEFT ANTECUBITAL Performed at [facility], [address] [place], [place] [address]    Special Requests   Final    BOTTLES DRAWN AEROBIC ONLY Blood Culture adequate volume Performed at [facility], [address] [place], [place] [address]    Culture   Final    NO GROWTH 3 DAYS Performed at [facility], [address] [place], [place] [address]    Report Status PENDING  Incomplete  Culture, blood (routine x 2)     Status: None (Preliminary result)   Collection Time: 11/22/18  5:23 PM   Specimen: BLOOD LEFT FOREARM  Result Value Ref Range Status   Specimen Description   Final    BLOOD LEFT FOREARM Performed at [facility], [address] [place], [place] [address]    Special Requests   Final    BOTTLES DRAWN AEROBIC ONLY Blood  Culture adequate volume Performed at [facility], [address] [place], [place] [address]    Culture   Final    NO GROWTH 3 DAYS Performed at [facility], [address] [place], [place] [address]    Report Status PENDING  Incomplete    Radiology Studies: Dg Chest Port 1 View  Result Date: 11/25/2018 CLINICAL DATA:  Shortness of breath EXAM: PORTABLE CHEST 1 VIEW COMPARISON:  November 23, 2018 FINDINGS: Probable tiny right effusion. Left retrocardiac opacity is more prominent the interval. The cardiomediastinal silhouette is stable. No pneumothorax. The lungs are otherwise clear. IMPRESSION: 1. The left retrocardiac opacity is mildly more prominent in the interval. No other changes. Electronically Signed   By: Gerome Samavid  Williams III M.D   On: 11/25/2018 08:26   Scheduled Meds:  amiodarone  150 mg Intravenous Once   amoxicillin-clavulanate  1 tablet Oral BID WC   azithromycin  500 mg Oral Q24H   enoxaparin (LOVENOX)  injection  30 mg Subcutaneous Q24H   feeding supplement  1 Container Oral BID BM   feeding supplement (ENSURE ENLIVE)  237 mL Oral BID BM   ipratropium  0.5 mg Nebulization BID   levalbuterol  0.63 mg Nebulization BID   multivitamin with minerals  1 tablet Oral Daily   pantoprazole  40 mg Oral Daily   sertraline  25 mg Oral Daily   Continuous Infusions:  amiodarone     Followed by   amiodarone     dextrose 5 % and 0.9 % NaCl with KCl 20 mEq/L 75 mL/hr at 11/25/18 0609   potassium PHOSPHATE IVPB (in mmol) 20 mmol (11/25/18 1034)    LOS: 3 days   Merlene Laughter, DO [facility] PAGER is on AMION  If 7PM-7AM, please contact night-coverage [email or web address] Password [facility] 11/25/2018, 12:59 PM

## 2018-11-25 NOTE — Progress Notes (Signed)
Patient very confused, yelling for help much of shift.  Husband did come in to visit patient, patient very glad to see husband even though she did call him her father.  Patient with minimal food intake however drank 3 Ensures this shift, ate some fruit and ice cream.

## 2018-11-25 NOTE — Consult Note (Signed)
Cardiology Consultation:   Patient ID: Lindsey Norris MRN: [id number]; DOB: 04-29-1926  Admit date: 11/21/2018 Date of Consult: 11/25/2018  Primary Care Provider: Gaspar Garbeisovec, Richard W, MD Primary Cardiologist: New (Gloriajean Okun) Primary Electrophysiologist:  None    Patient Profile:   Lindsey Norris is a 85 y.o. female without known significant CV illness who is being seen today for the evaluation of SVT at the request of Dr. Richardson ChiquitoShaikh.  History of Present Illness:   Lindsey Norris was admitted with hypotension, hypovolemia, suspected urosepsis and multiple metabolic abnormalities.  She has a history of hypertension and hyperlipidemia but no serious cardiovascular illness.  She is very hard of hearing and moderate dementia.  She is clearly disoriented and asks us to go to the office to check to see if her father is there.  Review of systems and history is therefore very limited and mostly obtained from the chart.  She is agitated and does not want to be left alone.  Her hypotension has improved and was likely mostly due to hypovolemia.  Telemetry shows extremely frequent but brief episodes of supraventricular tachycardia with fairly distinct spiky P waves, suggestive of pulmonary vein tachycardia.  Some of the episodes appear to degenerate to brief paroxysmal atrial fibrillation.  Heart rates are frequently in the 170s-180s for a few seconds.  In between the episodes of SVT she has normal sinus rhythm in the 90-100s.  Heart Pathway Score:     Past Medical History:  Diagnosis Date   Arthritis    Chronic gastritis    Colon polyp    dimnutive 2mm   COPD (chronic obstructive pulmonary disease) (HCC)    Duodenitis without hemorrhage    GERD (gastroesophageal reflux disease)    Hearing loss    Hemorrhoids, internal    & external   Hypercholesteremia    Hypertension    Osteoporosis 2013/2015/2017   2013 T score -2.5, 2015 T score - 2.4, 2017 T score -2.3 improved at both hips  from prior study   Sclerosis of the skin    LICHENS SCLEROSIS   Urticaria     Past Surgical History:  Procedure Laterality Date   APPENDECTOMY     CATARACT EXTRACTION     bilateral x 2   COLONOSCOPY     DILATION AND CURETTAGE OF UTERUS  1969   ESOPHAGOGASTRODUODENOSCOPY     NOSE SURGERY     DEVIATED SEPTUM REPAIR   REFRACTIVE SURGERY     LASER EYE SURGERY X2/ 2010 AND 2011     Home Medications:  Prior to Admission medications   Medication Sig Start Date End Date Taking? Authorizing Provider  EPINEPHrine (EPIPEN 2-PAK) 0.3 mg/0.3 mL IJ SOAJ injection Inject into thigh if needed for severe allergic reaction 02/24/14  Yes Young, Clinton D, MD  esomeprazole (NEXIUM) 40 MG capsule Take 40 mg by mouth 2 (two) times daily before a meal.   Yes [provider]  Multiple Vitamin (MULTIVITAMIN) capsule Take 1 capsule by mouth daily.     Yes [provider]  sertraline (ZOLOFT) 25 MG tablet TAKE 1 TABLET BY MOUTH EVERY DAY Patient taking differently: Take 25 mg by mouth daily.  10/23/18  Yes Penumalli, Glenford BayleyVikram R, MD  traMADol (ULTRAM) 50 MG tablet Take 50 mg by mouth every 6 (six) hours as needed for moderate pain.    Yes [provider]  triamterene-hydrochlorothiazide (DYAZIDE) 37.5-25 MG per capsule Take 1 capsule by mouth daily.    Yes [provider]  acetaminophen (  TYLENOL) 325 MG tablet Take 2 tablets (650 mg total) by mouth every 6 (six) hours as needed for mild pain (or Fever >/= 101). Patient not taking: Reported on 11/21/2018 04/27/17   Glade Lloyd, MD    Inpatient Medications: Scheduled Meds:  amiodarone  150 mg Intravenous Once   amoxicillin-clavulanate  1 tablet Oral BID WC   azithromycin  500 mg Oral Q24H   enoxaparin (LOVENOX) injection  30 mg Subcutaneous Q24H   feeding supplement  1 Container Oral BID BM   feeding supplement (ENSURE ENLIVE)  237 mL Oral BID BM   ipratropium  0.5 mg Nebulization BID   levalbuterol   0.63 mg Nebulization BID   multivitamin with minerals  1 tablet Oral Daily   pantoprazole  40 mg Oral Daily   sertraline  25 mg Oral Daily   Continuous Infusions:  amiodarone     Followed by   amiodarone     dextrose 5 % and 0.9 % NaCl with KCl 20 mEq/L 75 mL/hr at 11/25/18 0609   potassium PHOSPHATE IVPB (in mmol) 20 mmol (11/25/18 1034)   PRN Meds: acetaminophen **OR** acetaminophen, ondansetron **OR** ondansetron (ZOFRAN) IV  Allergies:    Allergies  Allergen Reactions   Sulfonamide Derivatives     REACTION: vomiting    Social History:   Social History   Socioeconomic History   Marital status: Married    Spouse name: Not on file   Number of children: 0   Years of education: Not on file   Highest education level: Not on file  Occupational History    Employer: RETIRED    Comment: RN, Arts administrator nursing school teacher  Social Needs   Financial resource strain: Not on file   Food insecurity    Worry: Not on file    Inability: Not on file   Transportation needs    Medical: Not on file    Non-medical: Not on file  Tobacco Use   Smoking status: Former Smoker    Packs/day: 0.10    Years: 4.00    Pack years: 0.40    Types: Cigarettes    Quit date: 02/27/1956    Years since quitting: 62.7   Smokeless tobacco: Never Used  Substance and Sexual Activity   Alcohol use: No    Alcohol/week: 0.0 standard drinks   Drug use: No   Sexual activity: Never    Birth control/protection: Post-menopausal    Comment: 1st intercourse 39yo-1 partner  Lifestyle   Physical activity    Days per week: Not on file    Minutes per session: Not on file   Stress: Not on file  Relationships   Social connections    Talks on phone: Not on file    Gets together: Not on file    Attends religious service: Not on file    Active member of club or organization: Not on file    Attends meetings of clubs or organizations: Not on file    Relationship status: Not on file    Intimate partner violence    Fear of current or ex partner: Not on file    Emotionally abused: Not on file    Physically abused: Not on file    Forced sexual activity: Not on file  Other Topics Concern   Not on file  Social History Narrative   Caffeine- 2 daily.  One stepson in [place], [place].  Education:  12 credits to doctorate.   Retired.  Lives with husband.  Family History:    Family History  Problem Relation Age of Onset   Hypertension Mother    Asthma Mother    Stroke Mother    Heart disease Father    Heart attack Father    Uterine cancer Paternal Aunt    Diabetes Maternal Grandfather    Stomach cancer Paternal Grandfather    Colon cancer Neg Hx    Allergic rhinitis Neg Hx    Angioedema Neg Hx    Eczema Neg Hx    Immunodeficiency Neg Hx    Urticaria Neg Hx      ROS:  Please see the history of present illness.  Very limited due to patient factors  Physical Exam/Data:   Vitals:   11/24/18 2128 11/24/18 2146 11/25/18 0543 11/25/18 0757  BP:  (!) 142/84 (!) 142/97   Pulse:  (!) 110 93   Resp:  18 18   Temp:  97.9 F (36.6 C) 98.2 F (36.8 C)   TempSrc:  Oral Oral   SpO2: 97% 97% 95% 96%  Weight:      Height:        Intake/Output Summary (Last 24 hours) at 11/25/2018 1221 Last data filed at 11/25/2018 0600 Gross per 24 hour  Intake 2230.22 ml  Output 700 ml  Net 1530.22 ml   Last 3 Weights 11/23/2018 11/21/2018 10/27/2018  Weight (lbs) 88 lb 2.9 oz 95 lb 10.9 oz 95 lb 10.9 oz  Weight (kg) 40 kg 43.4 kg 43.4 kg     Body mass index is 15.62 kg/m.  General: Cachectic, well developed, in no acute distress, but becomes very easily anxious HEENT: normal Lymph: no adenopathy Neck: no JVD Endocrine:  No thryomegaly Vascular: No carotid bruits; FA pulses 2+ bilaterally without bruits  Cardiac:  normal S1, S2; RRR; no murmur  Lungs:  clear to auscultation bilaterally, no wheezing, rhonchi or rales  Abd: soft, nontender, no hepatomegaly    Ext: no edema Musculoskeletal:  No deformities, BUE and BLE strength normal and equal Skin: warm and dry  Neuro: Not really cooperative with neurological exam but grossly nonfocal Psych: Anxious  EKG:  The EKG was personally reviewed and demonstrates: Sinus rhythm with premature atrial complexes, possible right atrial abnormality Telemetry:  Telemetry was personally reviewed and demonstrates: Background sinus rhythm/mild sinus tachycardia with extremely frequent PACs and incessant brief bursts of nonsustained and sustained atrial tachycardia/atrial fibrillation.  Appearance suggestive of pulmonary vein tachycardia  Relevant CV Studies: Echocardiogram April 26, 2017 - Left ventricle: The cavity size was normal. There was mild   concentric hypertrophy. Systolic function was vigorous. The   estimated ejection fraction was in the range of 65% to 70%. There   was dynamic obstruction at rest, with a peak velocity of 138   cm/sec and a peak gradient of 8 mm Hg. Wall motion was normal;   there were no regional wall motion abnormalities. Doppler   parameters are consistent with abnormal left ventricular   relaxation (grade 1 diastolic dysfunction). Doppler parameters   are consistent with indeterminate ventricular filling pressure. - Aortic valve: Transvalvular velocity was within the normal range.   There was no stenosis. There was no regurgitation. - Mitral valve: Transvalvular velocity was within the normal range.   There was no evidence for stenosis. There was mild regurgitation. - Left atrium: The atrium was mildly dilated. - Right ventricle: The cavity size was normal. Wall thickness was   normal. Systolic function was normal. - Atrial septum: There was increased thickness  of the septum,   consistent with lipomatous hypertrophy. No defect or patent   foramen ovale was identified. - Tricuspid valve: There was mild regurgitation. - Pulmonary arteries: Systolic pressure was within the  normal   range. PA peak pressure: 29 mm Hg (S).   Laboratory Data:  High Sensitivity Troponin:  No results for input(s): TROPONINIHS in the last 720 hours.   Chemistry Recent Labs  Lab 11/23/18 0350 11/24/18 0423 11/25/18 0405  NA 138 136 135  K 3.5 3.0* 3.6  CL 106 98 105  CO2 23 22 23   GLUCOSE 102* 108* 118*  BUN 9 5* 6*  CREATININE 0.70 0.53 0.59  CALCIUM 8.4* 8.4* 8.2*  GFRNONAA >60 >60 >60  GFRAA >60 >60 >60  ANIONGAP 9 16* 7    Recent Labs  Lab 11/23/18 0350 11/24/18 0423 11/25/18 0405  PROT 5.2* 6.3* 5.3*  ALBUMIN 3.0* 3.3* 2.9*  AST 23 22 19   ALT 15 16 12   ALKPHOS 50 60 49  BILITOT 0.5 0.7 0.7   Hematology Recent Labs  Lab 11/23/18 0350 11/24/18 0423 11/25/18 0405  WBC 10.1 10.4 8.4  RBC 4.15 4.78 4.39  HGB 12.2 13.9 12.8  HCT 38.9 44.1 40.8  MCV 93.7 92.3 92.9  MCH 29.4 29.1 29.2  MCHC 31.4 31.5 31.4  RDW 15.2 15.0 15.1  PLT 225 255 251   BNPNo results for input(s): BNP, PROBNP in the last 168 hours.  DDimer No results for input(s): DDIMER in the last 168 hours.   Radiology/Studies:  Dg Chest Port 1 View  Result Date: 11/25/2018 CLINICAL DATA:  Shortness of breath EXAM: PORTABLE CHEST 1 VIEW COMPARISON:  November 23, 2018 FINDINGS: Probable tiny right effusion. Left retrocardiac opacity is more prominent the interval. The cardiomediastinal silhouette is stable. No pneumothorax. The lungs are otherwise clear. IMPRESSION: 1. The left retrocardiac opacity is mildly more prominent in the interval. No other changes. Electronically Signed   By: Dorise Bullion III M.D   On: 11/25/2018 08:26   Dg Chest Port 1 View  Result Date: 11/23/2018 CLINICAL DATA:  Shortness of breath EXAM: PORTABLE CHEST 1 VIEW COMPARISON:  11/21/2018, 10/26/2018 FINDINGS: Suspected tiny pleural effusions. No change in patchy airspace disease at the left base. Stable enlarged cardiomediastinal silhouette with aortic atherosclerosis. No pneumothorax. IMPRESSION: 1. Suspected  tiny pleural effusions. 2. No change in airspace disease at the left base, atelectasis versus pneumonia 3. Mild cardiomegaly Electronically Signed   By: Donavan Foil M.D.   On: 11/23/2018 15:54   Dg Chest Port 1 View  Result Date: 11/21/2018 CLINICAL DATA:  Weakness. EXAM: PORTABLE CHEST 1 VIEW COMPARISON:  Radiograph of October 26, 2018. FINDINGS: Stable cardiomediastinal silhouette. Atherosclerosis of thoracic aorta is noted. No pneumothorax is noted. No pleural effusion is noted. Right lung is clear. Mild left basilar atelectasis or infiltrate is noted. Bony thorax is unremarkable. IMPRESSION: Stable mild left basilar atelectasis or infiltrate. Aortic Atherosclerosis (ICD10-I70.0). Electronically Signed   By: Marijo Conception M.D.   On: 11/21/2018 18:35    Assessment and Plan:   1. SVT: Appearance is suggestive of pulmonary vein tachycardia, some brief episodes of possible atrial fibrillation.  I do not think she is a good candidate for anticoagulation chronically, so I do not think it makes sense to put her on anticoagulants during the hospitalization (other than DVT prophylaxis).  Blood pressure remains erratic so we will use amiodarone for rate/rhythm control.  If her blood pressure gets better, can try diltiazem or  beta-blockers.  Repeat echocardiogram. 2. Aortic atherosclerosis: Noted on imaging studies but without history of coronary/vascular problems, no report of angina, no ischemic changes on ECG. 3. Moderate dementia: Evaluated by neurology earlier this year. 4. Cachexia: Hypoalbuminemia and multiple metabolic abnormalities.  Signs of gradual chronic decline.  Do not plan any aggressive/invasive intervention for her cardiac condition.      For questions or updates, please contact [facility] Please consult [email or web address] for contact info under     Signed, Thurmon Fair, MD  11/25/2018 12:22 PM

## 2018-11-25 NOTE — Consult Note (Signed)
Consultation Note Date: 11/25/2018   Patient Name: Lindsey Norris  DOB: [birth year]  MRN: [id number]  Age / Sex: 85 y.o., female  PCP: Tisovec, Fransico Him, MD Referring Physician: Kerney Elbe, DO  Reason for Consultation: Establishing goals of care  HPI/Patient Profile: 85 y.o. female  with past medical history of dementia and HTN admitted on 11/21/2018 with hypotension. UA concerning for UTI and CXR with some infiltrates. Patient thought to be dehydrated. Patient with behavior disturbances r/t dementia. Also now with bursts of SVT - starting on amiodarone drip. Patient now under emergency APS custody d/t spouse not agreeing to transfer to ED, concern of unsafe living environment. PMT consulted for [place].   Clinical Assessment and Goals of Care: I have reviewed medical records including EPIC notes, labs and imaging, received report from Dr. Alfredia Ferguson and RN - RN reports patient is completely disoriented and slightly agitated. She will not eat but is consuming a decent amount of liquids - including ensure.   I then reached out to APS - eventually spoke with [place] ([phone]). We reviewed the situation and the events that led up to APS obtaining emergency guardianship. Reeves Forth shares that APS will agree to whatever is medically recommended (as far as aggressive care vs comfort care, full code vs DNR). She shares that their concern is that the patient not return home - concern of unsafe living environment, no suitable caretaker. Plans for patient to be placed in SNF. Reeves Forth tells me it is okay to speak with husband regarding goals of care.   Attempted to reach spouse - no answer and unable to leave a voicemail. Will continue to attempt to speak with him. Hoping to establish what his wife's goals of care are and then make recommendations with APS on how to move forward with care in regards to scope of treatment and code status. Would  recommend DNR status in this frail, elderly patient with dementia if this aligns with goals of care.  Primary Decision Maker LEGAL GUARDIAN    SUMMARY OF RECOMMENDATIONS    - discussed situation with APS - patient may not return home - all other care decisions will be further discussed based off goals of care discussion with spouse - unable to get in touch with spouse - will continue attempts - suggest outpatient palliative follow at discharge to SNF   Code Status/Advance Care Planning:  Full code  Prognosis:   Unable to determine  Discharge Planning: [facility] for rehab with Palliative care service follow-up      Primary Diagnoses: Present on Admission: . Hypotension . UTI (urinary tract infection) . Hypertension   I have reviewed the medical record, interviewed the patient and family, and examined the patient. The following aspects are pertinent.  Past Medical History:  Diagnosis Date  . Arthritis   . Chronic gastritis   . Colon polyp    dimnutive 65mm  . COPD (chronic obstructive pulmonary disease) ([place])   . Duodenitis without hemorrhage   . GERD (gastroesophageal reflux disease)   . Hearing loss   . Hemorrhoids, internal    & external  . Hypercholesteremia   . Hypertension   . Osteoporosis 2013/2015/2017   2013 T score -2.5, 2015 T score - 2.4, 2017 T score -2.3 improved at both hips from prior study  . Sclerosis of the skin    LICHENS SCLEROSIS  . Urticaria    Social History   Socioeconomic History  . Marital status: Married  Spouse name: Not on file  . Number of children: 0  . Years of education: Not on file  . Highest education level: Not on file  Occupational History    Employer: RETIRED    Comment: RN, [employer] nursing school teacher  Social Needs  . Financial resource strain: Not on file  . Food insecurity    Worry: Not on file    Inability: Not on file  . Transportation needs    Medical: Not on file    Non-medical: Not on  file  Tobacco Use  . Smoking status: Former Smoker    Packs/day: 0.10    Years: 4.00    Pack years: 0.40    Types: Cigarettes    Quit date: 02/27/1956    Years since quitting: 62.7  . Smokeless tobacco: Never Used  Substance and Sexual Activity  . Alcohol use: No    Alcohol/week: 0.0 standard drinks  . Drug use: No  . Sexual activity: Never    Birth control/protection: Post-menopausal    Comment: 1st intercourse 39yo-1 partner  Lifestyle  . Physical activity    Days per week: Not on file    Minutes per session: Not on file  . Stress: Not on file  Relationships  . Social Musicianconnections    Talks on phone: Not on file    Gets together: Not on file    Attends religious service: Not on file    Active member of club or organization: Not on file    Attends meetings of clubs or organizations: Not on file    Relationship status: Not on file  Other Topics Concern  . Not on file  Social History Narrative   Caffeine- 2 Norris.  One stepson in [place], [place].  Education:  12 credits to doctorate.   Retired.  Lives with husband.     Family History  Problem Relation Age of Onset  . Hypertension Mother   . Asthma Mother   . Stroke Mother   . Heart disease Father   . Heart attack Father   . Uterine cancer Paternal Aunt   . Diabetes Maternal Grandfather   . Stomach cancer Paternal Grandfather   . Colon cancer Neg Hx   . Allergic rhinitis Neg Hx   . Angioedema Neg Hx   . Eczema Neg Hx   . Immunodeficiency Neg Hx   . Urticaria Neg Hx    Scheduled Meds: . amoxicillin-clavulanate  1 tablet Oral BID WC  . azithromycin  500 mg Oral Q24H  . enoxaparin (LOVENOX) injection  30 mg Subcutaneous Q24H  . feeding supplement  1 Container Oral BID BM  . feeding supplement (ENSURE ENLIVE)  237 mL Oral BID BM  . ipratropium  0.5 mg Nebulization BID  . levalbuterol  0.63 mg Nebulization BID  . multivitamin with minerals  1 tablet Oral Norris  . OLANZapine  2.5 mg Oral Norris  . pantoprazole  40 mg  Oral Norris  . sertraline  25 mg Oral Norris   Continuous Infusions: . amiodarone 60 mg/hr (11/25/18 1342)   Followed by  . amiodarone    . potassium PHOSPHATE IVPB (in mmol) 20 mmol (11/25/18 1034)   PRN Meds:.acetaminophen **OR** acetaminophen, ondansetron **OR** ondansetron (ZOFRAN) IV Allergies  Allergen Reactions  . Sulfonamide Derivatives     REACTION: vomiting    Vital Signs: BP (!) 136/91 (BP Location: Right Arm)   Pulse 95   Temp 98.6 F (37 C) (Oral)   Resp 18   Ht  5\' 3"  (1.6 m)   Wt 40 kg   SpO2 97%   BMI 15.62 kg/m  Pain Scale: 0-10   Pain Score: 0-No pain   SpO2: SpO2: 97 % O2 Device:SpO2: 97 % O2 Flow Rate: .O2 Flow Rate (L/min): 0 L/min  IO: Intake/output summary:   Intake/Output Summary (Last 24 hours) at 11/25/2018 1544 Last data filed at 11/25/2018 0600 Gross per 24 hour  Intake 2230.22 ml  Output 700 ml  Net 1530.22 ml    LBM: Last BM Date: 11/25/18 Baseline Weight: Weight: 43.4 kg Most recent weight: Weight: 40 kg     Palliative Assessment/Data: PPS 40%    The above conversation was completed via telephone due to the visitor restrictions during the COVID-19 pandemic. Thorough chart review and discussion with necessary members of the care team was completed as part of assessment. All issues were discussed and addressed but no physical exam was performed.  Time Total: 50 minutes Greater than 50%  of this time was spent counseling and coordinating care related to the above assessment and plan.  11/27/18, DNP, AGNP-C Palliative Medicine Team [phone] Pager: [phone]

## 2018-11-26 ENCOUNTER — Inpatient Hospital Stay (HOSPITAL_COMMUNITY): Payer: Medicare Other

## 2018-11-26 DIAGNOSIS — I471 Supraventricular tachycardia: Secondary | ICD-10-CM

## 2018-11-26 LAB — CULTURE, BLOOD (ROUTINE X 2)
Culture: NO GROWTH
Special Requests: ADEQUATE

## 2018-11-26 LAB — ECHOCARDIOGRAM COMPLETE
Height: 63 in
Weight: 1410.94 oz

## 2018-11-26 MED ORDER — HALOPERIDOL LACTATE 5 MG/ML IJ SOLN
1.0000 mg | Freq: Four times a day (QID) | INTRAMUSCULAR | Status: AC | PRN
  Administered 2018-11-27: 1 mg via INTRAVENOUS
  Filled 2018-11-26: qty 1

## 2018-11-26 MED ORDER — HALOPERIDOL LACTATE 5 MG/ML IJ SOLN: 1.0000 mg | Freq: Four times a day (QID) | INTRAMUSCULAR | Status: DC | PRN

## 2018-11-26 MED ORDER — AMIODARONE HCL 200 MG PO TABS: 200.0000 mg | ORAL_TABLET | Freq: Every day | ORAL | Status: DC

## 2018-11-26 MED ORDER — POTASSIUM CHLORIDE CRYS ER 20 MEQ PO TBCR
40.0000 meq | EXTENDED_RELEASE_TABLET | Freq: Once | ORAL | Status: AC
  Administered 2018-11-26: 40 meq via ORAL
  Filled 2018-11-26: qty 2

## 2018-11-26 MED ORDER — AMIODARONE HCL 200 MG PO TABS
200.0000 mg | ORAL_TABLET | Freq: Two times a day (BID) | ORAL | Status: DC
  Administered 2018-11-26 – 2018-12-04 (×17): 200 mg via ORAL
  Filled 2018-11-26 (×16): qty 1

## 2018-11-26 MED ORDER — MAGNESIUM SULFATE 2 GM/50ML IV SOLN
2.0000 g | Freq: Once | INTRAVENOUS | Status: AC
  Administered 2018-11-26: 2 g via INTRAVENOUS
  Filled 2018-11-26: qty 50

## 2018-11-26 NOTE — Progress Notes (Signed)
Daily Progress Note   Patient Name: Lindsey Norris       Date: 11/26/2018 DOB: Jul 17, 1926  Age: 85 y.o. MRN#: [id number] Attending Physician: Domenic Polite, MD Primary Care Physician: Haywood Pao, MD Admit Date: 11/21/2018  Reason for Consultation/Follow-up: Establishing goals of care  Subjective: HR better controlled today, on room air, disoriented x4  Length of Stay: 4  Current Medications: Scheduled Meds:   amiodarone  200 mg Oral BID   [START ON 12/10/2018] amiodarone  200 mg Oral Daily   amoxicillin-clavulanate  1 tablet Oral BID WC   enoxaparin (LOVENOX) injection  30 mg Subcutaneous Q24H   feeding supplement  1 Container Oral BID BM   feeding supplement (ENSURE ENLIVE)  237 mL Oral BID BM   ipratropium  0.5 mg Nebulization BID   levalbuterol  0.63 mg Nebulization BID   multivitamin with minerals  1 tablet Oral Daily   OLANZapine  2.5 mg Oral Daily   pantoprazole  40 mg Oral Daily   sertraline  25 mg Oral Daily    Continuous Infusions:  magnesium sulfate bolus IVPB 2 g (11/26/18 1412)    PRN Meds: acetaminophen **OR** acetaminophen, haloperidol lactate, ondansetron **OR** ondansetron (ZOFRAN) IV     Vital Signs: BP 108/86 (BP Location: Right Arm)    Pulse 81    Temp 98.7 F (37.1 C) (Oral)    Resp 17    Ht 5\' 3"  (1.6 m)    Wt 40 kg    SpO2 100%    BMI 15.62 kg/m  SpO2: SpO2: 100 % O2 Device: O2 Device: Room Air O2 Flow Rate: O2 Flow Rate (L/min): 0 L/min  Intake/output summary:   Intake/Output Summary (Last 24 hours) at 11/26/2018 1414 Last data filed at 11/26/2018 0200 Gross per 24 hour  Intake 89.85 ml  Output 800 ml  Net -710.15 ml   LBM: Last BM Date: 11/25/18 Baseline Weight: Weight: 43.4 kg Most recent weight: Weight: 40 kg         Palliative Assessment/Data: PPS 40%    Flowsheet Rows     Most Recent Value  Intake Tab  Referral Department  Hospitalist  Unit at Time of Referral  Cardiac/Telemetry Unit  Palliative Care Primary Diagnosis  Neurology  Date Notified  11/23/18  Palliative Care Type  New Palliative care  Reason for referral  Clarify Goals of Care  Date of Admission  11/21/18  Date first seen by Palliative Care  11/25/18  # of days Palliative referral response time  2 Day(s)  # of days IP prior to Palliative referral  2  Clinical Assessment  Palliative Performance Scale Score  40%  Psychosocial & Spiritual Assessment  Palliative Care Outcomes  Patient/Family meeting held?  No  Palliative Care Outcomes  Linked to palliative care logitudinal support      Patient Active Problem List   Diagnosis Date Noted   Dementia with behavioral disturbance ([employer])    Goals of care, counseling/discussion    Palliative care by specialist    Hypotension 11/21/2018   Weakness 10/27/2018   Hypokalemia 10/27/2018   UTI (urinary tract infection) 10/27/2018   Hyponatremia 10/27/2018   Seizures ([place]) 04/25/2017   Syncope 04/25/2017  Altered mental status 04/25/2017   Arthritis    Seasonal and perennial allergic rhinitis 05/09/2010   Hyperlipemia 06/11/2007   Hypertension 06/11/2007   HEMORRHOIDS 06/11/2007   COPD, MILD 06/11/2007   OSTEOARTHRITIS 06/11/2007   Personal history of colonic polyps 01/15/2007   Asthma, mild intermittent, well-controlled 01/15/2007   GERD 01/15/2007   GASTRITIS, CHRONIC 04/22/2001    Palliative Care Assessment & Plan   HPI: 85 y.o. female  with past medical history of dementia and HTN admitted on 11/21/2018 with hypotension. UA concerning for UTI and CXR with some infiltrates. Patient thought to be dehydrated. Patient with behavior disturbances r/t dementia. Also now with bursts of SVT - starting on amiodarone drip - being converted to PO amiodarone as of  9/30. Patient now under emergency APS custody d/t spouse not agreeing to transfer to ED, concern of unsafe living environment. PMT consulted for GOC.   Assessment: Yesterday I received permission from APS social worker Shaune Pascal to discuss goals of care with patient's spouse - I was unable to reach him via phone yesterday. I did get in touch with him today.   Spouse expresses his concern and grief over the situation. I attempted to provide emotional support.  I introduced Palliative Medicine as specialized medical care for people living with serious illness. It focuses on providing relief from the symptoms and stress of a serious illness. The goal is to improve quality of life for both the patient and the family.  I attempted to discuss patient's baseline status - husband shares he feels as though she was doing excellent at home. He does share about some weight loss and decline in appetite.    We discussed her current illness and what it means in the larger context of her on-going co-morbidities.   I attempted to elicit values and goals of care important to the patient. Spouse tells me what is most important to the patient is being at home and he wants her home.   He further shares that patient has established advance directives - I do not see these within epic - however, he shares that the patient would not want any attempts to prolong her life and would want to be allowed to pass away naturally. He tells me that her comfort/quality of life is more important to her and him than her length of life. He also speaks about patient saying many times that she is ready to die. He would like to avoid aggressive medical interventions. I agree that it is appropriate to focus on comfort and avoid aggressive interventions given patient's age, frailty, and dementia.   We explicitly discussed code status - described differences between full code and DNR - husbands tells me that patient would want DNR status. I  agree that this is appropriate. Discussed with Dr. Jomarie Longs who also agrees.   I spoke with APS social worker Administrator, arts and reviewed conversation with spouse - made recommendation to change code status to DNR - Shaune Pascal is going to speak with her director and follow up with me about process of changing code status.   Recommendations/Plan:  Complicated situation d/t APS involvement  Husband reports that goals of care are to focus on comfort and avoid aggressive medical interventions - he would very much like patient to be at home however plan is for SNF placement d/t APS recommendations  Per husband and reported advance directive: patient would not want resuscitation attempts - requests code status change to DNR - I have requested APS social worker  begin this process of changing code status  Suggest palliative care follow at SNF  Code Status:  Full code - have requested change to DNR per spouse/patient's wishes - medically appropriate  Prognosis:   Unable to determine  Discharge Planning:  Skilled Nursing Facility for rehab with Palliative care service follow-up  Care plan was discussed with APS social worker, hospital social worker Morrie Sheldonshley, Dr. Jomarie LongsJoseph, patient's spouse Lavina Hammanrmand Vermette  The above conversation was completed via telephone due to the visitor restrictions during the COVID-19 pandemic. Thorough chart review and discussion with necessary members of the care team was completed as part of assessment. All issues were discussed and addressed but no physical exam was performed.  Thank you for allowing the Palliative Medicine Team to assist in the care of this patient.   Total Time 40 minutes Prolonged Time Billed  no       Greater than 50%  of this time was spent counseling and coordinating care related to the above assessment and plan.  Gerlean RenShae Lee Alexys Gassett, DNP, [facility]NP[facility] Palliative Medicine Team Team Phone # [phone]  Pager [phone]

## 2018-11-26 NOTE — Progress Notes (Signed)
PROGRESS NOTE    Lindsey Norris  VOJ:[id number] DOB: 1926/07/03 DOA: 11/21/2018 PCP: Gaspar Garbe, MD   Brief Narrative: Lindsey Norris is a 85 y.o. female with history of hypertension, hearing loss possible dementia recently admitted for UTI , 3 weeks ago was brought to the ER after patient was found to be hypotensive.  Per report provided by the ER physician as patient is confused and at this time patient's care has been taken over by state since patient's husband was refusing to transfer patient to the ER, his goal was reportedly to keep her comfortable patient was found to be hypotensive by patient's home health aide and EMS was called the previous day but patient had refused transfer.  Again 9/25 patient was visited by the home health aide again found to be hypotensive at this time, APS was called, state took custody of patient's care and she was transferred to the ER ED Course: In the ER patient was hypotensive with a blood pressure in the 80s,  Labs show increased creatinine from baseline of 1.48 3 weeks ago weights around 1.1 with sodium of 130 UA concerning for UTI and chest x-ray showing some infiltrates.   COVID-19 test was negative.  -Hypotension has improved, PT OT recommending skilled nursing facility. Hospitalization complicated by intermittent bouts of SVT that has not been sustaining so Cardiology was consulted for further evaluation and Recommendations. Palliative consulted for further GOC Discussion and assistance with APS situation.   Assessment & Plan:   Hypotension likely could be from Dehydration in the setting of suspected pneumonia. Improving -Does not appear to be septic.   -However given that patient has UTI and possible pneumonia, inability to obtain meaningful history she was started on empiric antibiotics, given normal saline bolus followed by continuous infusion -Blood pressure is normalized, blood cultures are negative urine culture grew Enterococcus  faecalis -She was originally treated with IV ceftriaxone followed by Augmentin day 3  Hyponatremia, improved -Improved with hydration, HCTZ discontinued   Enterococcus UTI versus colonization with compounding likely dementia  -Recently had a UTI 3 weeks ago, cultures grew multiple species at the time -Urinalysis this admission showed large leukocytes, rare bacteria, 11-20 WBCs -Urine culture following reintubation now with Enterococcus faecalis, ceftriaxone changed to Augmentin day 3 now  Left Basilar Pneumonia, poA -CXR showed- Mild left basilar atelectasis or infiltrate  -Repeat CXR showed "Suspected tiny pleural effusions. No change in airspace disease at the left base, atelectasis versus " -Antibiotics with IV Ceftriaxone and Azithromycin changed to Augmentin for 3 more Days  -Improved and stable from the standpoint  AKI, improved Elevated anion gap -Resolved with hydration, HCTZ discontinued  History of Hypertension  -HCTZ discontinued, BP stable  History of Depression -C/w Sertraline 25 mg po Daily   Normocytic Anemia -Stable, hemoglobin is stable   Hypokalemia -Repleted   Moderate Dementia with Behavior Disturbances -Started on Sertraline 25 mg po Daily per Neurology for Mood Stabilization -Palliative Care Consulted for GOC Discussion, overall debility, malnutrition and worsening cognition  Intermittent SVT and A. fib RVR -Appreciate cardiology input, was on IV amiodarone this morning -changed to PO -Felt to be a poor candidate for anticoagulation  DVT prophylaxis: Enoxaparin 30 mg sq q24h Code Status: FULL CODE  Family Communication: No family at bedside Disposition Plan: To be determined  Consultants:   Palliative Care  Cardiology  Procedures:  ECHOCARDIOGRAM   Antimicrobials:  Anti-infectives (From admission, onward)   Start     Dose/Rate Route Frequency Ordered  Stop   11/24/18 1700  amoxicillin-clavulanate (AUGMENTIN) 500-125 MG per tablet 500  mg     1 tablet Oral 2 times daily with meals 11/24/18 1419 11/27/18 1659   11/23/18 1800  azithromycin (ZITHROMAX) tablet 500 mg     500 mg Oral Every 24 hours 11/23/18 1616 11/25/18 1740   11/22/18 1800  cefTRIAXone (ROCEPHIN) 2 g in sodium chloride 0.9 % 100 mL IVPB  Status:  Discontinued     2 g 200 mL/hr over 30 Minutes Intravenous Every 24 hours 11/22/18 0143 11/24/18 1419   11/22/18 1800  azithromycin (ZITHROMAX) 500 mg in sodium chloride 0.9 % 250 mL IVPB  Status:  Discontinued     500 mg 250 mL/hr over 60 Minutes Intravenous Every 24 hours 11/22/18 0143 11/23/18 1614   11/21/18 1930  cefTRIAXone (ROCEPHIN) 1 g in sodium chloride 0.9 % 100 mL IVPB     1 g 200 mL/hr over 30 Minutes Intravenous  Once 11/21/18 1917 11/21/18 2106   11/21/18 1930  azithromycin (ZITHROMAX) 500 mg in sodium chloride 0.9 % 250 mL IVPB     500 mg 250 mL/hr over 60 Minutes Intravenous  Once 11/21/18 1917 11/22/18 0005     Subjective: -No events overnight, remains pleasantly confused -On amiodarone drip  Vitals:   11/25/18 1300 11/25/18 2019 11/25/18 2128 11/26/18 0415  BP: (!) 136/91 102/63  104/79  Pulse: 95 89  87  Resp: 18 (!) 24  18  Temp: 98.6 F (37 C) 98.2 F (36.8 C)  98.4 F (36.9 C)  TempSrc: Oral Oral  Oral  SpO2: 97% 98% 96% 97%  Weight:      Height:        Intake/Output Summary (Last 24 hours) at 11/26/2018 1354 Last data filed at 11/26/2018 0200 Gross per 24 hour  Intake 89.85 ml  Output 800 ml  Net -710.15 ml   Filed Weights   11/21/18 1722 11/23/18 0443  Weight: 43.4 kg 40 kg   Examination: Physical Exam:  Gen: Frail thinly built elderly female, chronically ill-appearing, awake alert oriented to self only, pleasantly confused  HEENT: PERRLA, Neck supple, no JVD Lungs: Few basilar rhonchi otherwise clear CVS: S1-S2/irregularly irregular rhythm Abd: soft, Non tender, non distended, BS present Extremities: No edema Skin: no new rashes Psych, poor insight and judgment    Data Reviewed: I have personally reviewed following labs and imaging studies  CBC: Recent Labs  Lab 11/21/18 1747 11/22/18 0144 11/22/18 0501 11/23/18 0350 11/24/18 0423 11/25/18 0405  WBC 10.0 7.4 8.0 10.1 10.4 8.4  NEUTROABS 6.1  --   --  7.5 7.9* 5.4  HGB 13.5 11.7* 11.7* 12.2 13.9 12.8  HCT 40.8 37.0 36.5 38.9 44.1 40.8  MCV 90.5 93.2 92.6 93.7 92.3 92.9  PLT 305 221 221 225 255 251   Basic Metabolic Panel: Recent Labs  Lab 11/21/18 1747 11/22/18 0144 11/22/18 0501 11/23/18 0350 11/24/18 0423 11/25/18 0405  NA 130*  --  136 138 136 135  K 4.3  --  3.2* 3.5 3.0* 3.6  CL 92*  --  102 106 98 105  CO2 27  --  GLUCOSE 101*  --  79 102* 108* 118*  BUN 20  --  13 9 5* 6*  CREATININE 1.13* 0.83 0.81 0.70 0.53 0.59  CALCIUM 9.1  --  8.4* 8.4* 8.4* 8.2*  MG  --   --  2.1 1.6* 1.7 1.6*  PHOS  --   --  3.3 2.9 2.2* 2.3*   GFR: Estimated Creatinine Clearance: 29.5 mL/min (by C-G formula based on SCr of 0.59 mg/dL). Liver Function Tests: Recent Labs  Lab 11/21/18 1747 11/22/18 0501 11/23/18 0350 11/24/18 0423 11/25/18 0405  AST 39 ALT ALKPHOS 60 49 50 60 49  BILITOT 1.5* 0.7 0.5 0.7 0.7  PROT 6.6 5.2* 5.2* 6.3* 5.3*  ALBUMIN 3.8 2.9* 3.0* 3.3* 2.9*   No results for input(s): LIPASE, AMYLASE in the last 168 hours. No results for input(s): AMMONIA in the last 168 hours. Coagulation Profile: No results for input(s): INR, PROTIME in the last 168 hours. Cardiac Enzymes: No results for input(s): CKTOTAL, CKMB, CKMBINDEX, TROPONINI in the last 168 hours. BNP (last 3 results) No results for input(s): PROBNP in the last 8760 hours. HbA1C: No results for input(s): HGBA1C in the last 72 hours. CBG: Recent Labs  Lab 11/22/18 1209  GLUCAP 80   Lipid Profile: No results for input(s): CHOL, HDL, LDLCALC, TRIG, CHOLHDL, LDLDIRECT in the last 72 hours. Thyroid Function Tests: No results for input(s): TSH, T4TOTAL, FREET4,  T3FREE, THYROIDAB in the last 72 hours. Anemia Panel: No results for input(s): VITAMINB12, FOLATE, FERRITIN, TIBC, IRON, RETICCTPCT in the last 72 hours. Sepsis Labs: Recent Labs  Lab 11/21/18 1746 11/21/18 2028  LATICACIDVEN 1.4 1.0    Recent Results (from the past 240 hour(s))  Blood culture (routine x 2)     Status: None   Collection Time: 11/21/18  5:46 PM   Specimen: BLOOD RIGHT ARM  Result Value Ref Range Status   Specimen Description   Final    BLOOD RIGHT ARM Performed at [facility], [address] [place], [place] [address]    Special Requests   Final    BOTTLES DRAWN AEROBIC AND ANAEROBIC Blood Culture adequate volume Performed at [facility], [address] [place], [place] [address]    Culture   Final    NO GROWTH 5 DAYS Performed at [facility], [address], [place], [place] [address]    Report Status 11/26/2018 FINAL  Final  Blood culture (routine x 2)     Status: Abnormal   Collection Time: 11/21/18  5:47 PM   Specimen: BLOOD  Result Value Ref Range Status   Specimen Description   Final    BLOOD LEFT ANTECUBITAL Performed at [facility], [address], [place], [place] [address]    Special Requests   Final    BOTTLES DRAWN AEROBIC AND ANAEROBIC Blood Culture results may not be optimal due to an inadequate volume of blood received in culture bottles Performed at [facility], [address] [place], [place] [address]    Culture  Setup Time   Final    GRAM POSITIVE COCCI AEROBIC BOTTLE ONLY CRITICAL RESULT CALLED TO, READ BACK BY AND VERIFIED WITH: PHARMD M BELL [id number] MLM    Culture (A)  Final    STAPHYLOCOCCUS SPECIES (COAGULASE NEGATIVE) THE SIGNIFICANCE OF ISOLATING THIS ORGANISM FROM A SINGLE SET OF BLOOD CULTURES WHEN MULTIPLE SETS ARE DRAWN IS UNCERTAIN. PLEASE NOTIFY THE MICROBIOLOGY DEPARTMENT WITHIN ONE WEEK IF SPECIATION AND SENSITIVITIES ARE  REQUIRED. Performed at [facility], [address], [place], [place] [address]    Report Status 11/24/2018 FINAL  Final  Blood Culture ID Panel (Reflexed)     Status: Abnormal   Collection Time: 11/21/18  5:47 PM  Result Value Ref Range Status   Enterococcus species NOT DETECTED NOT DETECTED Final   Listeria monocytogenes NOT DETECTED NOT DETECTED Final   Staphylococcus species DETECTED (A) NOT DETECTED Final    Comment: Methicillin (oxacillin) resistant coagulase negative staphylococcus. Possible blood culture contaminant (unless isolated from more than one blood culture draw or clinical case suggests pathogenicity). No antibiotic treatment is indicated for blood  culture contaminants. CRITICAL RESULT CALLED TO, READ BACK BY AND VERIFIED WITH: PHARMD M BELL [id number] MLM    Staphylococcus aureus (BCID) NOT DETECTED NOT DETECTED Final   Methicillin resistance DETECTED (A) NOT DETECTED Final    Comment: CRITICAL RESULT CALLED TO, READ BACK BY AND VERIFIED WITH: PHARMD M BELL [id number] MLM    Streptococcus species NOT DETECTED NOT DETECTED Final   Streptococcus agalactiae NOT DETECTED NOT DETECTED Final   Streptococcus pneumoniae NOT DETECTED NOT DETECTED Final   Streptococcus pyogenes NOT DETECTED NOT DETECTED Final   Acinetobacter baumannii NOT DETECTED NOT DETECTED Final   Enterobacteriaceae species NOT DETECTED NOT DETECTED Final   Enterobacter cloacae complex NOT DETECTED NOT DETECTED Final   Escherichia coli NOT DETECTED NOT DETECTED Final   Klebsiella oxytoca NOT DETECTED NOT DETECTED Final   Klebsiella pneumoniae NOT DETECTED NOT DETECTED Final   Proteus species NOT DETECTED NOT DETECTED Final   Serratia marcescens NOT DETECTED NOT DETECTED Final   Haemophilus influenzae NOT DETECTED NOT DETECTED Final   Neisseria meningitidis NOT DETECTED NOT DETECTED Final   Pseudomonas aeruginosa NOT DETECTED NOT DETECTED Final   Candida albicans NOT DETECTED NOT DETECTED Final    Candida glabrata NOT DETECTED NOT DETECTED Final   Candida krusei NOT DETECTED NOT DETECTED Final   Candida parapsilosis NOT DETECTED NOT DETECTED Final   Candida tropicalis NOT DETECTED NOT DETECTED Final    Comment: Performed at [facility], [address], [place], [place] [address]  Urine culture     Status: Abnormal   Collection Time: 11/21/18  8:28 PM   Specimen: Urine, Clean Catch  Result Value Ref Range Status   Specimen Description   Final    URINE, CLEAN CATCH Performed at [facility], [address] [place], [place] [address]    Special Requests   Final    NONE Performed at [facility], [address] [place], [place] [address]    Culture >=100,000 COLONIES/mL ENTEROCOCCUS FAECALIS (A)  Final   Report Status 11/24/2018 FINAL  Final   Organism ID, Bacteria ENTEROCOCCUS FAECALIS (A)  Final      Susceptibility   Enterococcus faecalis - MIC*    AMPICILLIN <=2 SENSITIVE Sensitive     LEVOFLOXACIN 0.5 SENSITIVE Sensitive     NITROFURANTOIN <=16 SENSITIVE Sensitive     VANCOMYCIN <=0.5 SENSITIVE Sensitive     * >=100,000 COLONIES/mL ENTEROCOCCUS FAECALIS  SARS Coronavirus 2 [facility] order, Performed in [facility] hospital lab) Nasopharyngeal Nasopharyngeal Swab     Status: None   Collection Time: 11/21/18  8:28 PM   Specimen: Nasopharyngeal Swab  Result Value Ref Range Status   SARS Coronavirus 2 NEGATIVE NEGATIVE Final    Comment: (NOTE) If result is NEGATIVE SARS-CoV-2 target nucleic acids are NOT DETECTED. The SARS-CoV-2 RNA is generally detectable in upper and lower  respiratory specimens during the acute phase of infection. The lowest  concentration of SARS-CoV-2 viral copies this assay can detect is 250  copies / mL. A negative result does not preclude SARS-CoV-2 infection  and should not  be used as the sole basis for treatment or other  patient management decisions.  A negative result may occur with  improper  specimen collection / handling, submission of specimen other  than nasopharyngeal swab, presence of viral mutation(s) within the  areas targeted by this assay, and inadequate number of viral copies  (<250 copies / mL). A negative result must be combined with clinical  observations, patient history, and epidemiological information. If result is POSITIVE SARS-CoV-2 target nucleic acids are DETECTED. The SARS-CoV-2 RNA is generally detectable in upper and lower  respiratory specimens dur ing the acute phase of infection.  Positive  results are indicative of active infection with SARS-CoV-2.  Clinical  correlation with patient history and other diagnostic information is  necessary to determine patient infection status.  Positive results do  not rule out bacterial infection or co-infection with other viruses. If result is PRESUMPTIVE POSTIVE SARS-CoV-2 nucleic acids MAY BE PRESENT.   A presumptive positive result was obtained on the submitted specimen  and confirmed on repeat testing.  While 2019 novel coronavirus  (SARS-CoV-2) nucleic acids may be present in the submitted sample  additional confirmatory testing may be necessary for epidemiological  and / or clinical management purposes  to differentiate between  SARS-CoV-2 and other Sarbecovirus currently known to infect humans.  If clinically indicated additional testing with an alternate test  methodology [phone] is advised. The SARS-CoV-2 RNA is generally  detectable in upper and lower respiratory sp ecimens during the acute  phase of infection. The expected result is Negative. Fact Sheet for Patients:  StrictlyIdeas.no Fact Sheet for Healthcare Providers: BankingDealers.co.za This test is not yet approved or cleared by the [place] FDA and has been authorized for detection and/or diagnosis of SARS-CoV-2 by FDA under an Emergency Use Authorization (EUA).  This EUA will remain in  effect (meaning this test can be used) for the duration of the COVID-19 declaration under Section 564(b)(1) of the Act, 21 U.S.C. section 360bbb-3(b)(1), unless the authorization is terminated or revoked sooner. Performed at [facility], [place] [address], [place], [place] [address]   Culture, blood (routine x 2)     Status: None (Preliminary result)   Collection Time: 11/22/18  5:18 PM   Specimen: BLOOD  Result Value Ref Range Status   Specimen Description   Final    BLOOD LEFT ANTECUBITAL Performed at [place] [address] [place], [place] [address]    Special Requests   Final    BOTTLES DRAWN AEROBIC ONLY Blood Culture adequate volume Performed at [place] [address], [place], [place] [address]    Culture   Final    NO GROWTH 4 DAYS Performed at [facility], [place] [address], [place], [place] [address]    Report Status PENDING  Incomplete  Culture, blood (routine x 2)     Status: None (Preliminary result)   Collection Time: 11/22/18  5:23 PM   Specimen: BLOOD LEFT FOREARM  Result Value Ref Range Status   Specimen Description   Final    BLOOD LEFT FOREARM Performed at [facility], [place] [address], [place], [place] [address]    Special Requests   Final    BOTTLES DRAWN AEROBIC ONLY Blood Culture adequate volume Performed at [place] [address] [place], [place] [address]    Culture   Final    NO GROWTH 4 DAYS Performed at [facility], [place]  [address] [place], [place] [address]    Report Status PENDING  Incomplete    Radiology Studies: Dg Chest Port 1 View  Result Date: 11/25/2018 CLINICAL DATA:  Shortness of breath EXAM: PORTABLE CHEST 1 VIEW COMPARISON:  November 23, 2018 FINDINGS: Probable tiny right effusion. Left retrocardiac opacity is more prominent the interval. The cardiomediastinal silhouette is stable. No pneumothorax. The lungs are otherwise  clear. IMPRESSION: 1. The left retrocardiac opacity is mildly more prominent in the interval. No other changes. Electronically Signed   By: Gerome Samavid  Williams III M.D   On: 11/25/2018 08:26   Scheduled Meds: . amiodarone  200 mg Oral BID  . [START ON 12/10/2018] amiodarone  200 mg Oral Daily  . amoxicillin-clavulanate  1 tablet Oral BID WC  . enoxaparin (LOVENOX) injection  30 mg Subcutaneous Q24H  . feeding supplement  1 Container Oral BID BM  . feeding supplement (ENSURE ENLIVE)  237 mL Oral BID BM  . ipratropium  0.5 mg Nebulization BID  . levalbuterol  0.63 mg Nebulization BID  . multivitamin with minerals  1 tablet Oral Daily  . OLANZapine  2.5 mg Oral Daily  . pantoprazole  40 mg Oral Daily  . potassium chloride  40 mEq Oral Once  . sertraline  25 mg Oral Daily   Continuous Infusions: . magnesium sulfate bolus IVPB      LOS: 4 days   Zannie CovePreetha Hailley Byers, MD [facility]  11/26/2018, 1:54 PM

## 2018-11-26 NOTE — Progress Notes (Signed)
  Echocardiogram 2D Echocardiogram has been performed.  Jannett Celestine 11/26/2018, 3:04 PM

## 2018-11-26 NOTE — Progress Notes (Addendum)
Progress Note  Patient Name: Lindsey Norris Date of Encounter: 11/26/2018  Primary Cardiologist: Thurmon FairMihai Verdun Rackley, MD  Subjective   No complaints this morning. Seems more alert today. 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 Denies chest pain or SOB.   Inpatient Medications    Scheduled Meds: . amoxicillin-clavulanate  1 tablet Oral BID WC  . enoxaparin (LOVENOX) injection  30 mg Subcutaneous Q24H  . feeding supplement  1 Container Oral BID BM  . feeding supplement (ENSURE ENLIVE)  237 mL Oral BID BM  . ipratropium  0.5 mg Nebulization BID  . levalbuterol  0.63 mg Nebulization BID  . multivitamin with minerals  1 tablet Oral Daily  . OLANZapine  2.5 mg Oral Daily  . pantoprazole  40 mg Oral Daily  . sertraline  25 mg Oral Daily   Continuous Infusions: . amiodarone 30 mg/hr (11/26/18 0736)   PRN Meds: acetaminophen **OR** acetaminophen, ondansetron **OR** ondansetron (ZOFRAN) IV   Vital Signs    Vitals:   11/25/18 1300 11/25/18 2019 11/25/18 2128 11/26/18 0415  BP: (!) 136/91 102/63  104/79  Pulse: 95 89  87  Resp: 18 (!) 24  18  Temp: 98.6 F (37 C) 98.2 F (36.8 C)  98.4 F (36.9 C)  TempSrc: Oral Oral  Oral  SpO2: 97% 98% 96% 97%  Weight:      Height:        Intake/Output Summary (Last 24 hours) at 11/26/2018 0950 Last data filed at 11/26/2018 0200 Gross per 24 hour  Intake 209.85 ml  Output 800 ml  Net -590.15 ml   Filed Weights   11/21/18 1722 11/23/18 0443  Weight: 43.4 kg 40 kg    Telemetry    Significant improvement in bursts of SVT with rates generally in the 80s - Personally Reviewed  Physical Exam   GEN: No acute distress.   Neck: No JVD, no carotid bruits Cardiac: IRIR, no murmurs, rubs, or gallops.  Respiratory: Clear to auscultation bilaterally, no wheezes/ rales/ rhonchi GI: NABS, Soft, nontender, non-distended  MS: No edema; No deformity. Neuro:  Nonfocal, moving all extremities spontaneously, HOH Psych: Pleasantly demented  Labs     Chemistry Recent Labs  Lab 11/23/18 0350 11/24/18 0423 11/25/18 0405  NA 138 136 135  K 3.5 3.0* 3.6  CL 106 98 105  CO2 23 22 23   GLUCOSE 102* 108* 118*  BUN 9 5* 6*  CREATININE 0.70 0.53 0.59  CALCIUM 8.4* 8.4* 8.2*  PROT 5.2* 6.3* 5.3*  ALBUMIN 3.0* 3.3* 2.9*  AST 23 22 19   ALT 15 16 12   ALKPHOS 50 60 49  BILITOT 0.5 0.7 0.7  GFRNONAA >60 >60 >60  GFRAA >60 >60 >60  ANIONGAP 9 16* 7     Hematology Recent Labs  Lab 11/23/18 0350 11/24/18 0423 11/25/18 0405  WBC 10.1 10.4 8.4  RBC 4.15 4.78 4.39  HGB 12.2 13.9 12.8  HCT 38.9 44.1 40.8  MCV 93.7 92.3 92.9  MCH 29.4 29.1 29.2  MCHC 31.4 31.5 31.4  RDW 15.2 15.0 15.1  PLT 225 255 251    Cardiac EnzymesNo results for input(s): TROPONINI in the last 168 hours. No results for input(s): TROPIPOC in the last 168 hours.   BNPNo results for input(s): BNP, PROBNP in the last 168 hours.   DDimer No results for input(s): DDIMER in the last 168 hours.   Radiology    Dg Chest Port 1 View  Result Date: 11/25/2018 CLINICAL DATA:  Shortness  of breath EXAM: PORTABLE CHEST 1 VIEW COMPARISON:  November 23, 2018 FINDINGS: Probable tiny right effusion. Left retrocardiac opacity is more prominent the interval. The cardiomediastinal silhouette is stable. No pneumothorax. The lungs are otherwise clear. IMPRESSION: 1. The left retrocardiac opacity is mildly more prominent in the interval. No other changes. Electronically Signed   By: Gerome Sam III M.D   On: 11/25/2018 08:26    Cardiac Studies   Echocardiogram 04/2017: Study Conclusions  - Left ventricle: The cavity size was normal. There was mild   concentric hypertrophy. Systolic function was vigorous. The   estimated ejection fraction was in the range of 65% to 70%. There   was dynamic obstruction at rest, with a peak velocity of 138   cm/sec and a peak gradient of 8 mm Hg. Wall motion was normal;   there were no regional wall motion abnormalities. Doppler    parameters are consistent with abnormal left ventricular   relaxation (grade 1 diastolic dysfunction). Doppler parameters   are consistent with indeterminate ventricular filling pressure. - Aortic valve: Transvalvular velocity was within the normal range.   There was no stenosis. There was no regurgitation. - Mitral valve: Transvalvular velocity was within the normal range.   There was no evidence for stenosis. There was mild regurgitation. - Left atrium: The atrium was mildly dilated. - Right ventricle: The cavity size was normal. Wall thickness was   normal. Systolic function was normal. - Atrial septum: There was increased thickness of the septum,   consistent with lipomatous hypertrophy. No defect or patent   foramen ovale was identified. - Tricuspid valve: There was mild regurgitation. - Pulmonary arteries: Systolic pressure was within the normal   range. PA peak pressure: 29 mm Hg (S).  Patient Profile     85 y.o. female without known significant CV illness who is being followed by cardiology for the evaluation of SVT   Assessment & Plan    1. SVT: appearance suggestive of pulmonary vein tachycardia with some brief episodes of possible atrial fibrillation. She was started on an amiodarone gtt yesterday with significant improvement in bursts of SVT. Felt to be a poor candidate for anticoagulation.  - Will convert to po amiodarone today. Start amiodarone 200mg  BID x2 weeks, then 200mg  daily - Continue to monitor electrolytes and replete to maintain K >4, Mg >2 - repleted 40 mEq K today and 2g IV Mg today.   2. Aortic atherosclerosis: noted on imaging studies but no history of coronary/vascular problems, EKG without ischemic changes, and patient without anginal complaints. - No further cardiac work-up at this time  3. Moderate dementia: evaluated by neurology earlier this year. Appears she has had gradual chronic decline.  - Continue management per primary team    For questions  or updates, please contact [facility] Please consult [email or web address] for contact info under Cardiology/STEMI.      Signed, , PA-C  11/26/2018, 9:50 AM   [phone]  I have seen and examined the patient along with 11/28/2018, PA-C.  I have reviewed the chart, notes and new data.  I agree with PA's note.  Key new complaints: no CV complaints Key examination changes: RRR, cachectic Key new findings / data: remarkable reduction in episodes of PAT on amio, almost abolished.  PLAN: [facility] will sign off.   Medication Recommendations:  Amiodarone 200mg  BID x2 weeks, then 200mg  daily Other recommendations (labs, testing, etc):  No further cardiovascular testing Follow up as an outpatient:  Will arrange virtual visit in 30 days.   Sanda Klein, MD, [employer] [phone] 11/26/2018, 11:05 AM

## 2018-11-26 NOTE — Progress Notes (Signed)
Occupational Therapy Treatment Patient Details Name: Lindsey Norris MRN: [id number] DOB: 1926-05-21 Today's Date: 11/26/2018    History of present illness Lindsey Norris is a 85 y.o. female with history of hypertension, hearing loss possible dementia recently admitted for UTI 3 weeks ago was brought to the ER after patient was found to be hypotensive.   OT comments  Worked on grooming, sitting balance and sit to stand x 2.    Follow Up Recommendations  SNF    Equipment Recommendations  None recommended by OT    Recommendations for Other Services      Precautions / Restrictions Precautions Precautions: Fall Precaution Comments: Pt HOH, uses white board to communicate. Pt is very fearful of being left alone (per last admission) Restrictions Weight Bearing Restrictions: No       Mobility Bed Mobility               General bed mobility comments: sitting EOB with PT  Transfers   Equipment used: 2 person hand held assist   Sit to Stand: Mod assist;+2 physical assistance         General transfer comment: worked on sit to stand twice    Balance     Sitting balance-Leahy Scale: Fair Sitting balance - Comments: min guard for safety     Standing balance-Leahy Scale: Poor Standing balance comment: min to mod A for maintaining standing with legs against bed                           ADL either performed or assessed with clinical judgement   ADL       Grooming: Brushing hair;Moderate assistance                                 General ADL Comments: cues for completeness. Pt has limited shoulder ROM.  supported RUE with L.     Vision       Perception     Praxis      Cognition Arousal/Alertness: Awake/alert Behavior During Therapy: WFL for tasks assessed/performed Overall Cognitive Status: History of cognitive impairments - at baseline                                 General Comments: h/o dementia.  Very  HOH; repeatedly asked the same things.  Convinced that it was 3 am. Pt in room with window covered (in preparation for COVID negative pressure room)        Exercises     Shoulder Instructions       General Comments      Pertinent Vitals/ Pain       Pain Assessment: Faces Pain Score: 0-No pain  Home Living                                          Prior Functioning/Environment              Frequency           Progress Toward Goals  OT Goals(current goals can now be found in the care plan section)  Progress towards OT goals: Progressing toward goals(slowly)     Plan      Co-evaluation    PT/OT/SLP Co-Evaluation/Treatment:  Yes Reason for Co-Treatment: Complexity of the patient's impairments (multi-system involvement);For patient/therapist safety PT goals addressed during session: Mobility/safety with mobility OT goals addressed during session: ADL's and self-care      AM-PAC OT "6 Clicks" Daily Activity     Outcome Measure   Help from another person eating meals?: A Lot Help from another person taking care of personal grooming?: A Lot Help from another person toileting, which includes using toliet, bedpan, or urinal?: Total Help from another person bathing (including washing, rinsing, drying)?: Total Help from another person to put on and taking off regular upper body clothing?: Total Help from another person to put on and taking off regular lower body clothing?: Total 6 Click Score: 8    End of Session    OT Visit Diagnosis: Unsteadiness on feet (R26.81);Muscle weakness (generalized) (M62.81);Other abnormalities of gait and mobility (R26.89);Other symptoms and signs involving cognitive function   Activity Tolerance Patient limited by fatigue   Patient Left in bed;with call bell/phone within reach;with bed alarm set   Nurse Communication          Time: 1435-1500 OT Time Calculation (min): 25 min  Charges: OT General  Charges $OT Visit: 1 Visit OT Treatments $Self Care/Home Management : 8-22 mins  Lindsey Norris, OTR/L Acute Rehabilitation Services [phone] [facility] pager [phone] office 11/26/2018   Lindsey Norris 11/26/2018, 3:15 PM

## 2018-11-26 NOTE — TOC Progression Note (Addendum)
Transition of Care [facility]) - Progression Note    Patient Details  Name: KAMPBELL HOLAWAY MRN: [id number] Date of Birth: October 27, 1926  Transition of Care [facility]) CM/SW [place], LCSW Phone Number: 11/26/2018, 9:44 AM  Clinical Narrative:   CSW spoke with patients APS worker Camilla via phone. Per [place] For[place] stated APS would like patient to go to [place]. CSW spoke to admission coordinator and they have started authorization through insurance.  1:00Pm  Patient now has authorization through patients insurance      Expected Discharge Plan: [place] Barriers to Discharge: Continued Medical Work up  Expected Discharge Plan and Services Expected Discharge Plan: [place] In-house Referral: Clinical Social Work Discharge Planning Services: CM Consult Post Acute Care Choice: [place] Living arrangements for the past 2 months: [place] Expected Discharge Date: 11/24/18                           [place] Date [place]: 11/21/18 Time [place]: 1803 Representative spoke with at [place]: [place] (SDOH) Interventions    Readmission Risk Interventions No flowsheet data found.

## 2018-11-26 NOTE — Progress Notes (Signed)
Physical Therapy Treatment Patient Details Name: Lindsey Norris MRN: [id number] DOB: July 30, 1926 Today's Date: 11/26/2018    History of Present Illness Lindsey Norris is a 85 y.o. female with history of hypertension, hearing loss possible dementia recently admitted for UTI 3 weeks ago was brought to the ER after patient was found to be hypotensive.    PT Comments     Patient restless, asking why she is sitting up. Assisted to stand x 2. Legs weak. Continue PT.  Follow Up Recommendations  SNF     Equipment Recommendations  None recommended by PT    Recommendations for Other Services       Precautions / Restrictions Precautions Precautions: Fall Precaution Comments: Pt HOH, uses white board to communicate. Pt is very fearful of being left alone (per last admission) Restrictions Weight Bearing Restrictions: No    Mobility  Bed Mobility               General bed mobility comments: sitting EOB with PT  Transfers   Equipment used: 2 person hand held assist Transfers: Sit to/from Stand Sit to Stand: Mod assist;+2 physical assistance         General transfer comment: worked on sit to stand twice  Ambulation/Gait                 Stairs             Wheelchair Mobility    Modified Rankin (Stroke Patients Only)       Balance     Sitting balance-Leahy Scale: Fair Sitting balance - Comments: min guard for safety     Standing balance-Leahy Scale: Poor Standing balance comment: min to mod A for maintaining standing with legs against bed                            Cognition Arousal/Alertness: Awake/alert Behavior During Therapy: Anxious;Restless Overall Cognitive Status: History of cognitive impairments - at baseline                                 General Comments: h/o dementia.  Very HOH; repeatedly asked the same things.  Convinced that it was 3 am. Pt in room with window covered (in preparation for COVID  negative pressure room)      Exercises      General Comments        Pertinent Vitals/Pain Pain Assessment: Faces Pain Score: 0-No pain    Home Living                      Prior Function            PT Goals (current goals can now be found in the care plan section) Progress towards PT goals: Progressing toward goals    Frequency    Min 2X/week      PT Plan Current plan remains appropriate    Co-evaluation   Reason for Co-Treatment: Complexity of the patient's impairments (multi-system involvement);For patient/therapist safety PT goals addressed during session: Mobility/safety with mobility OT goals addressed during session: ADL's and self-care      AM-PAC PT "6 Clicks" Mobility   Outcome Measure  Help needed turning from your back to your side while in a flat bed without using bedrails?: A Lot Help needed moving from lying on your back to sitting on the side of a flat  bed without using bedrails?: A Lot Help needed moving to and from a bed to a chair (including a wheelchair)?: Total Help needed standing up from a chair using your arms (e.g., wheelchair or bedside chair)?: Total Help needed to walk in hospital room?: Total Help needed climbing 3-5 steps with a railing? : Total 6 Click Score: 8    End of Session   Activity Tolerance: Patient limited by fatigue;Patient tolerated treatment well Patient left: in bed;with call bell/phone within reach;with bed alarm set Nurse Communication: Mobility status PT Visit Diagnosis: Unsteadiness on feet (R26.81);Muscle weakness (generalized) (M62.81);Other abnormalities of gait and mobility (R26.89)     Time: 1430-1500 PT Time Calculation (min) (ACUTE ONLY): 30 min  Charges:  $Therapeutic Activity: 8-22 mins                     Tresa Endo PT Acute Rehabilitation Services Pager [phone] Office [phone]    [place], [place] 11/26/2018, 3:40 PM

## 2018-11-27 DIAGNOSIS — R0602 Shortness of breath: Secondary | ICD-10-CM

## 2018-11-27 LAB — CULTURE, BLOOD (ROUTINE X 2)
Culture: NO GROWTH
Culture: NO GROWTH
Special Requests: ADEQUATE
Special Requests: ADEQUATE

## 2018-11-27 LAB — BASIC METABOLIC PANEL
Anion gap: 6 (ref 5–15)
BUN: 10 mg/dL (ref 8–23)
CO2: 24 mmol/L (ref 22–32)
Calcium: 8.5 mg/dL — ABNORMAL LOW (ref 8.9–10.3)
Chloride: 106 mmol/L (ref 98–111)
Creatinine, Ser: 0.61 mg/dL (ref 0.44–1.00)
GFR calc Af Amer: >60 mL/min (ref >60)
GFR calc non Af Amer: >60 mL/min (ref >60)
Glucose, Bld: 93 mg/dL (ref 70–99)
Potassium: 4.1 mmol/L (ref 3.5–5.1)
Sodium: 136 mmol/L (ref 135–145)

## 2018-11-27 LAB — CBC
HCT: 35.5 % — ABNORMAL LOW (ref 36.0–46.0)
Hemoglobin: 11.1 g/dL — ABNORMAL LOW (ref 12.0–15.0)
MCH: 29.4 pg (ref 26.0–34.0)
MCHC: 31.3 g/dL (ref 30.0–36.0)
MCV: 93.9 fL (ref 80.0–100.0)
Platelets: 175 10*3/uL (ref 150–400)
RBC: 3.78 MIL/uL — ABNORMAL LOW (ref 3.87–5.11)
RDW: 15.4 % (ref 11.5–15.5)
WBC: 7.7 10*3/uL (ref 4.0–10.5)
nRBC: 0 % (ref 0.0–0.2)

## 2018-11-27 MED ORDER — HALOPERIDOL LACTATE 5 MG/ML IJ SOLN
1.0000 mg | Freq: Four times a day (QID) | INTRAMUSCULAR | Status: AC | PRN
  Administered 2018-11-29: 1 mg via INTRAVENOUS
  Filled 2018-11-27: qty 1

## 2018-11-27 NOTE — Progress Notes (Signed)
Occupational Therapy Treatment Patient Details Name: Lindsey Norris MRN: [id number] DOB: [birth year] Today's Date: 11/27/2018    History of present illness Lindsey Norris is a 85 y.o. female with history of hypertension, hearing loss possible dementia recently admitted for UTI 3 weeks ago was brought to the ER after patient was found to be hypotensive.   OT comments  Pt hollaring to get OOB.  Pt did initially participate with OT but then became agitated.  Returned to supine and settled pt.    Pt did calm down but wants to get out of here she says.   Follow Up Recommendations  SNF    Equipment Recommendations  None recommended by OT    Recommendations for Other Services      Precautions / Restrictions Precautions Precautions: Fall Precaution Comments: Pt HOH, uses white board to communicate. Pt is very fearful of being left alone (per last admission) Restrictions Weight Bearing Restrictions: No       Mobility Bed Mobility Overal bed mobility: Needs Assistance Bed Mobility: Supine to Sit;Sit to Supine Rolling: Max assist Sidelying to sit: Max assist          Transfers                 General transfer comment: did not perform    Balance Overall balance assessment: Needs assistance Sitting-balance support: Feet supported;Single extremity supported Sitting balance-Leahy Scale: Poor                                     ADL either performed or assessed with clinical judgement   ADL Overall ADL's : Needs assistance/impaired     Grooming: Wash/dry hands;Brushing hair;Wash/dry face;Minimal assistance Grooming Details (indicate cue type and reason): pt did not complete task. Pt became distracted at times and needed redirection.  Pt then threw comb in the floor                               General ADL Comments: grooming sitting EOB and then returned to supine               Cognition   Behavior During Therapy:  Anxious Overall Cognitive Status: No family/caregiver present to determine baseline cognitive functioning                                                     Pertinent Vitals/ Pain       Pain Score: 0-No pain         Frequency  Min 2X/week        Progress Toward Goals  OT Goals(current goals can now be found in the care plan section)  Progress towards OT goals: OT to reassess next treatment     Plan Discharge plan remains appropriate       AM-PAC OT "6 Clicks" Daily Activity     Outcome Measure   Help from another person eating meals?: A Little Help from another person taking care of personal grooming?: A Little Help from another person toileting, which includes using toliet, bedpan, or urinal?: Total Help from another person bathing (including washing, rinsing, drying)?: A Lot Help from another person to put on and taking off regular upper  body clothing?: A Lot Help from another person to put on and taking off regular lower body clothing?: Total 6 Click Score: 12    End of Session    OT Visit Diagnosis: Unsteadiness on feet (R26.81);Muscle weakness (generalized) (M62.81);Other abnormalities of gait and mobility (R26.89);Other symptoms and signs involving cognitive function   Activity Tolerance Treatment limited secondary to agitation   Patient Left in bed;with call bell/phone within reach;with bed alarm set   Nurse Communication Mobility status        Time: 9977-4142 OT Time Calculation (min): 15 min  Charges: OT General Charges $OT Visit: 1 Visit OT Treatments $Self Care/Home Management : 8-22 mins  Kari Baars, [place] [phone] Office- [place], Lindsey Norris 11/27/2018, 11:50 AM

## 2018-11-27 NOTE — Care Management Important Message (Signed)
Important Message  Patient Details IM Letter given to Rhea Pink SW to present to the Patient Name: MARENDA ACCARDI MRN: [id number] Date of Birth: [birth year]   Medicare Important Message Given:  Yes     [name] 11/27/2018, 12:43 PM

## 2018-11-27 NOTE — Progress Notes (Addendum)
PROGRESS NOTE    Lindsey Norris  OVF:[id number] DOB: September 13, 1926 DOA: 11/21/2018 PCP: Haywood Pao, MD   Brief Narrative: Lindsey Norris is a 85 y.o. female with history of hypertension, hearing loss possible dementia recently admitted for UTI , 3 weeks ago was brought to the ER after patient was found to be hypotensive.  Per report provided by the ER physician as patient is confused and at this time patient's care has been taken over by state since patient's husband was refusing to transfer patient to the ER, his goal was reportedly to keep her comfortable patient was found to be hypotensive by patient's home health aide and EMS was called the previous day but patient had refused transfer.  Again 9/25 patient was visited by the home health aide again found to be hypotensive at this time, APS was called, state took custody of patient's care and she was transferred to the ER ED Course: In the ER patient was hypotensive with a blood pressure in the 80s,  Labs show increased creatinine from baseline of 1.48 3 weeks ago weights around 1.1 with sodium of 130 UA concerning for UTI and chest x-ray showing some infiltrates.   COVID-19 test was negative.  -Hypotension has improved, PT OT recommending skilled nursing facility. Hospitalization complicated by intermittent bouts of SVT that has not been sustaining so Cardiology was consulted for further evaluation and Recommendations. Palliative consulted for further GOC Discussion and assistance with APS situation.   Assessment & Plan:   Hypotension likely could be from Dehydration in the setting of suspected pneumonia. Improving -Does not appear to be septic.   -However given that patient has UTI and possible pneumonia, inability to obtain meaningful history she was started on empiric antibiotics, given normal saline bolus followed by continuous infusion -Blood pressure is normalized, blood cultures are negative urine culture grew Enterococcus  faecalis -She was originally treated with IV ceftriaxone followed by Augmentin, day 4 of Abx now  Hyponatremia, improved -Improved with hydration, HCTZ discontinued   Enterococcus UTI versus colonization  -Recently had a UTI 3 weeks ago, cultures grew multiple species at the time -Urinalysis this admission showed large leukocytes, rare bacteria, 11-20 WBCs -Urine culture following reintubation now with Enterococcus faecalis, ceftriaxone changed to Augmentin day 4/5 now  Left Basilar Pneumonia, poA -CXR showed- Mild left basilar atelectasis or infiltrate  -Repeat CXR showed "Suspected tiny pleural effusions. No change in airspace disease at the left base, atelectasis versus " -Rx with IV Ceftriaxone and Azithromycin changed to Augmentin for 1-2 more Days  -Improved and stable from the standpoint  AKI, improved Elevated anion gap -Resolved with hydration, HCTZ discontinued  History of Hypertension  -HCTZ discontinued, BP stable  History of Depression -C/w Sertraline 25 mg po Daily   Normocytic Anemia -Stable, hemoglobin is stable   Hypokalemia -Repleted   Moderate Dementia with Behavior Disturbances -Started on Sertraline 25 mg po Daily per Neurology for Mood Stabilization -Palliative Care Consulted for [place] Discussion, overall debility, malnutrition and worsening cognition  Intermittent SVT and A. fib RVR -Appreciate cardiology input, was on IV amiodarone, now changed to PO -Felt to be a poor candidate for anticoagulation  DVT prophylaxis: Enoxaparin 30 mg sq q24h Code Status: FULL CODE  Family Communication: No family at bedside, APS is POA, will recommend discussion of DNR Disposition Plan: To be determined  Consultants:   Palliative Care  Cardiology  Procedures:  ECHOCARDIOGRAM   Antimicrobials:  Anti-infectives (From admission, onward)   Start  Dose/Rate Route Frequency Ordered Stop   11/24/18 1700  amoxicillin-clavulanate (AUGMENTIN) 500-125 MG per  tablet 500 mg     1 tablet Oral 2 times daily with meals 11/24/18 1419 11/27/18 0816   11/23/18 1800  azithromycin (ZITHROMAX) tablet 500 mg     500 mg Oral Every 24 hours 11/23/18 1616 11/25/18 1740   11/22/18 1800  cefTRIAXone (ROCEPHIN) 2 g in sodium chloride 0.9 % 100 mL IVPB  Status:  Discontinued     2 g 200 mL/hr over 30 Minutes Intravenous Every 24 hours 11/22/18 0143 11/24/18 1419   11/22/18 1800  azithromycin (ZITHROMAX) 500 mg in sodium chloride 0.9 % 250 mL IVPB  Status:  Discontinued     500 mg 250 mL/hr over 60 Minutes Intravenous Every 24 hours 11/22/18 0143 11/23/18 1614   11/21/18 1930  cefTRIAXone (ROCEPHIN) 1 g in sodium chloride 0.9 % 100 mL IVPB     1 g 200 mL/hr over 30 Minutes Intravenous  Once 11/21/18 1917 11/21/18 2106   11/21/18 1930  azithromycin (ZITHROMAX) 500 mg in sodium chloride 0.9 % 250 mL IVPB     500 mg 250 mL/hr over 60 Minutes Intravenous  Once 11/21/18 1917 11/22/18 0005     Subjective: -No events overnight, was upset about female nurse placing a purewick last night  Vitals:   11/26/18 2003 11/26/18 2019 11/27/18 0552 11/27/18 0826  BP: 101/62  (!) 156/97   Pulse: 78  83   Resp: 18  20   Temp: 97.8 F (36.6 C)  98.1 F (36.7 C)   TempSrc: Oral  Oral   SpO2: 96% 96% 100% 98%  Weight:      Height:        Intake/Output Summary (Last 24 hours) at 11/27/2018 1320 Last data filed at 11/27/2018 1000 Gross per 24 hour  Intake 100 ml  Output -  Net 100 ml   Filed Weights   11/21/18 1722 11/23/18 0443  Weight: 43.4 kg 40 kg   Examination:  Physical Exam:  Gen: Elderly frail chronically ill-appearing alert awake oriented to self only, pleasantly confused HEENT: PERRLA, Neck supple, no JVD Lungs: Good air movement bilaterally, CTAB CVS: RRR,No Gallops,Rubs or new Murmurs Abd: soft, Non tender, non distended, BS present Extremities: No edema Skin: no new rashes Psych, poor insight and judgment   Data Reviewed: I have personally  reviewed following labs and imaging studies  CBC: Recent Labs  Lab 11/21/18 1747  11/22/18 0501 11/23/18 0350 11/24/18 0423 11/25/18 0405 11/27/18 0424  WBC 10.0   < > 8.0 10.1 10.4 8.4 7.7  NEUTROABS 6.1  --   --  7.5 7.9* 5.4  --   HGB 13.5   < > 11.7* 12.2 13.9 12.8 11.1*  HCT 40.8   < > 36.5 38.9 44.1 40.8 35.5*  MCV 90.5   < > 92.6 93.7 92.3 92.9 93.9  PLT 305   < > 221 225 255 251 175   < > = values in this interval not displayed.   Basic Metabolic Panel: Recent Labs  Lab 11/22/18 0501 11/23/18 0350 11/24/18 0423 11/25/18 0405 11/27/18 0424  NA 136 138 136 135 136  K 3.2* 3.5 3.0* 3.6 4.1  CL 102 106 98 105 106  CO2 GLUCOSE 79 102* 108* 118* 93  BUN 13 9 5* 6* 10  CREATININE 0.81 0.70 0.53 0.59 0.61  CALCIUM 8.4* 8.4* 8.4* 8.2* 8.5*  MG 2.1 1.6* 1.7 1.6*  --  PHOS 3.3 2.9 2.2* 2.3*  --    GFR: Estimated Creatinine Clearance: 29.5 mL/min (by C-G formula based on SCr of 0.61 mg/dL). Liver Function Tests: Recent Labs  Lab 11/21/18 1747 11/22/18 0501 11/23/18 0350 11/24/18 0423 11/25/18 0405  AST 39 23 23 22 19   ALT 13 14 15 16 12   ALKPHOS 60 49 50 60 49  BILITOT 1.5* 0.7 0.5 0.7 0.7  PROT 6.6 5.2* 5.2* 6.3* 5.3*  ALBUMIN 3.8 2.9* 3.0* 3.3* 2.9*   No results for input(s): LIPASE, AMYLASE in the last 168 hours. No results for input(s): AMMONIA in the last 168 hours. Coagulation Profile: No results for input(s): INR, PROTIME in the last 168 hours. Cardiac Enzymes: No results for input(s): CKTOTAL, CKMB, CKMBINDEX, TROPONINI in the last 168 hours. BNP (last 3 results) No results for input(s): PROBNP in the last 8760 hours. HbA1C: No results for input(s): HGBA1C in the last 72 hours. CBG: Recent Labs  Lab 11/22/18 1209  GLUCAP 80   Lipid Profile: No results for input(s): CHOL, HDL, LDLCALC, TRIG, CHOLHDL, LDLDIRECT in the last 72 hours. Thyroid Function Tests: No results for input(s): TSH, T4TOTAL, FREET4, T3FREE, THYROIDAB in  the last 72 hours. Anemia Panel: No results for input(s): VITAMINB12, FOLATE, FERRITIN, TIBC, IRON, RETICCTPCT in the last 72 hours. Sepsis Labs: Recent Labs  Lab 11/21/18 1746 11/21/18 2028  LATICACIDVEN 1.4 1.0    Recent Results (from the past 240 hour(s))  Blood culture (routine x 2)     Status: None   Collection Time: 11/21/18  5:46 PM   Specimen: BLOOD RIGHT ARM  Result Value Ref Range Status   Specimen Description   Final    BLOOD RIGHT ARM Performed at [facility], [address] [place], [place] [address]    Special Requests   Final    BOTTLES DRAWN AEROBIC AND ANAEROBIC Blood Culture adequate volume Performed at [facility], [address] [place], [place] [address]    Culture   Final    NO GROWTH 5 DAYS Performed at [facility], [address] [place], [place] [address]    Report Status 11/26/2018 FINAL  Final  Blood culture (routine x 2)     Status: Abnormal   Collection Time: 11/21/18  5:47 PM   Specimen: BLOOD  Result Value Ref Range Status   Specimen Description   Final    BLOOD LEFT ANTECUBITAL Performed at [facility], [address] [place], [place] [address]    Special Requests   Final    BOTTLES DRAWN AEROBIC AND ANAEROBIC Blood Culture results may not be optimal due to an inadequate volume of blood received in culture bottles Performed at [facility], [address] [place], [place] [address]    Culture  Setup Time   Final    GRAM POSITIVE COCCI AEROBIC BOTTLE ONLY CRITICAL RESULT CALLED TO, READ BACK BY AND VERIFIED WITH: PHARMD M BELL [id number] MLM    Culture (A)  Final    STAPHYLOCOCCUS SPECIES (COAGULASE NEGATIVE) THE SIGNIFICANCE OF ISOLATING THIS ORGANISM FROM A SINGLE SET OF BLOOD CULTURES WHEN MULTIPLE SETS ARE DRAWN IS UNCERTAIN. PLEASE NOTIFY THE MICROBIOLOGY DEPARTMENT WITHIN ONE WEEK IF SPECIATION AND SENSITIVITIES ARE REQUIRED. Performed at  [facility], [address] [place], [place] [address]    Report Status 11/24/2018 FINAL  Final  Blood Culture ID Panel (Reflexed)     Status: Abnormal   Collection Time:  11/21/18  5:47 PM  Result Value Ref Range Status   Enterococcus species NOT DETECTED NOT DETECTED Final   Listeria monocytogenes NOT DETECTED NOT DETECTED Final   Staphylococcus species DETECTED (A) NOT DETECTED Final    Comment: Methicillin (oxacillin) resistant coagulase negative staphylococcus. Possible blood culture contaminant (unless isolated from more than one blood culture draw or clinical case suggests pathogenicity). No antibiotic treatment is indicated for blood  culture contaminants. CRITICAL RESULT CALLED TO, READ BACK BY AND VERIFIED WITH: PHARMD M BELL [id number] MLM    Staphylococcus aureus (BCID) NOT DETECTED NOT DETECTED Final   Methicillin resistance DETECTED (A) NOT DETECTED Final    Comment: CRITICAL RESULT CALLED TO, READ BACK BY AND VERIFIED WITH: PHARMD M BELL [id number] MLM    Streptococcus species NOT DETECTED NOT DETECTED Final   Streptococcus agalactiae NOT DETECTED NOT DETECTED Final   Streptococcus pneumoniae NOT DETECTED NOT DETECTED Final   Streptococcus pyogenes NOT DETECTED NOT DETECTED Final   Acinetobacter baumannii NOT DETECTED NOT DETECTED Final   Enterobacteriaceae species NOT DETECTED NOT DETECTED Final   Enterobacter cloacae complex NOT DETECTED NOT DETECTED Final   Escherichia coli NOT DETECTED NOT DETECTED Final   Klebsiella oxytoca NOT DETECTED NOT DETECTED Final   Klebsiella pneumoniae NOT DETECTED NOT DETECTED Final   Proteus species NOT DETECTED NOT DETECTED Final   Serratia marcescens NOT DETECTED NOT DETECTED Final   Haemophilus influenzae NOT DETECTED NOT DETECTED Final   Neisseria meningitidis NOT DETECTED NOT DETECTED Final   Pseudomonas aeruginosa NOT DETECTED NOT DETECTED Final   Candida albicans NOT DETECTED NOT DETECTED Final   Candida glabrata NOT  DETECTED NOT DETECTED Final   Candida krusei NOT DETECTED NOT DETECTED Final   Candida parapsilosis NOT DETECTED NOT DETECTED Final   Candida tropicalis NOT DETECTED NOT DETECTED Final    Comment: Performed at [facility], [address] [place], [place] [address]  Urine culture     Status: Abnormal   Collection Time: 11/21/18  8:28 PM   Specimen: Urine, Clean Catch  Result Value Ref Range Status   Specimen Description   Final    URINE, CLEAN CATCH Performed at [facility], [address] [place], [place] [address]    Special Requests   Final    NONE Performed at [facility], [address], [place], [place] [address]    Culture >=100,000 COLONIES/mL ENTEROCOCCUS FAECALIS (A)  Final   Report Status 11/24/2018 FINAL  Final   Organism ID, Bacteria ENTEROCOCCUS FAECALIS (A)  Final      Susceptibility   Enterococcus faecalis - MIC*    AMPICILLIN <=2 SENSITIVE Sensitive     LEVOFLOXACIN 0.5 SENSITIVE Sensitive     NITROFURANTOIN <=16 SENSITIVE Sensitive     VANCOMYCIN <=0.5 SENSITIVE Sensitive     * >=100,000 COLONIES/mL ENTEROCOCCUS FAECALIS  SARS Coronavirus 2 [facility] order, Performed in [facility] hospital lab) Nasopharyngeal Nasopharyngeal Swab     Status: None   Collection Time: 11/21/18  8:28 PM   Specimen: Nasopharyngeal Swab  Result Value Ref Range Status   SARS Coronavirus 2 NEGATIVE NEGATIVE Final    Comment: (NOTE) If result is NEGATIVE SARS-CoV-2 target nucleic acids are NOT DETECTED. The SARS-CoV-2 RNA is generally detectable in upper and lower  respiratory specimens during the acute phase of infection. The lowest  concentration of SARS-CoV-2 viral copies this assay can detect is 250  copies / mL. A negative result does not preclude SARS-CoV-2  infection  and should not be used as the sole basis for treatment or other  patient management decisions.  A negative result may occur with  improper specimen collection /  handling, submission of specimen other  than nasopharyngeal swab, presence of viral mutation(s) within the  areas targeted by this assay, and inadequate number of viral copies  (<250 copies / mL). A negative result must be combined with clinical  observations, patient history, and epidemiological information. If result is POSITIVE SARS-CoV-2 target nucleic acids are DETECTED. The SARS-CoV-2 RNA is generally detectable in upper and lower  respiratory specimens dur ing the acute phase of infection.  Positive  results are indicative of active infection with SARS-CoV-2.  Clinical  correlation with patient history and other diagnostic information is  necessary to determine patient infection status.  Positive results do  not rule out bacterial infection or co-infection with other viruses. If result is PRESUMPTIVE POSTIVE SARS-CoV-2 nucleic acids MAY BE PRESENT.   A presumptive positive result was obtained on the submitted specimen  and confirmed on repeat testing.  While 2019 novel coronavirus  (SARS-CoV-2) nucleic acids may be present in the submitted sample  additional confirmatory testing may be necessary for epidemiological  and / or clinical management purposes  to differentiate between  SARS-CoV-2 and other Sarbecovirus currently known to infect humans.  If clinically indicated additional testing with an alternate test  methodology [id number]) is advised. The SARS-CoV-2 RNA is generally  detectable in upper and lower respiratory sp ecimens during the acute  phase of infection. The expected result is Negative. Fact Sheet for Patients:  BoilerBrush.com.cy Fact Sheet for Healthcare Providers: https://pope.com/ This test is not yet approved or cleared by the [place] FDA and has been authorized for detection and/or diagnosis of SARS-CoV-2 by FDA under an Emergency Use Authorization (EUA).  This EUA will remain in effect (meaning this  test can be used) for the duration of the COVID-19 declaration under Section 564(b)(1) of the Act, 21 U.S.C. section 360bbb-3(b)(1), unless the authorization is terminated or revoked sooner. Performed at [facility], [address], [place], [place] [address]   Culture, blood (routine x 2)     Status: None   Collection Time: 11/22/18  5:18 PM   Specimen: BLOOD  Result Value Ref Range Status   Specimen Description   Final    BLOOD LEFT ANTECUBITAL Performed at [facility], [address], [place], [place] [address]    Special Requests   Final    BOTTLES DRAWN AEROBIC ONLY Blood Culture adequate volume Performed at [facility], [address], [place], [place] [address]    Culture   Final    NO GROWTH 5 DAYS Performed at [facility], [address] [place], [place] [address]    Report Status 11/27/2018 FINAL  Final  Culture, blood (routine x 2)     Status: None   Collection Time: 11/22/18  5:23 PM   Specimen: BLOOD LEFT FOREARM  Result Value Ref Range Status   Specimen Description   Final    BLOOD LEFT FOREARM Performed at [facility], [address], [place], [place] [address]    Special Requests   Final    BOTTLES DRAWN AEROBIC ONLY Blood Culture adequate volume Performed at [facility], [address] [place], [place] [address]    Culture   Final    NO GROWTH 5 DAYS Performed at [facility],  [address] [place], [place] [address]    Report Status 11/27/2018 FINAL  Final    Radiology Studies: No results found. Scheduled Meds: . amiodarone  200 mg Oral BID  . [START ON 12/10/2018] amiodarone  200 mg Oral Daily  . enoxaparin (LOVENOX) injection  30 mg Subcutaneous Q24H  . feeding supplement  1 Container Oral BID BM  . feeding supplement (ENSURE ENLIVE)  237 mL Oral BID BM  . ipratropium  0.5 mg Nebulization BID  . levalbuterol  0.63 mg Nebulization BID  .  multivitamin with minerals  1 tablet Oral Daily  . OLANZapine  2.5 mg Oral Daily  . pantoprazole  40 mg Oral Daily  . sertraline  25 mg Oral Daily   Continuous Infusions:   LOS: 5 days   Zannie Cove, MD [facility]  11/27/2018, 1:20 PM

## 2018-11-28 LAB — CREATININE, SERUM
Creatinine, Ser: 0.61 mg/dL (ref 0.44–1.00)
GFR calc Af Amer: >60 mL/min (ref >60)
GFR calc non Af Amer: >60 mL/min (ref >60)

## 2018-11-28 LAB — NOVEL CORONAVIRUS, NAA (HOSP ORDER, SEND-OUT TO REF LAB; TAT 18-24 HRS): SARS-CoV-2, NAA: NOT DETECTED

## 2018-11-28 NOTE — TOC Progression Note (Signed)
Transition of Care [facility]) - Progression Note    Patient Details  Name: Lindsey Norris MRN: [id number] Date of Birth: 15-Nov-1926  [facility]) CM/SW [place], LCSW Phone Number: 11/28/2018, 5:14 PM  Clinical Narrative:   CSW following patient for support and discharge needs. CSW has contacted patients guardian and the guardians supervisor several time and left voicemail's. CSW is unable to fax over DNR information to APS for them to read over because they have not responded to CSW numerous attempts to contact them. CSW's supervisor has also attempted to contact DSS with no response     Expected Discharge Plan: [place] Barriers to Discharge: Continued Medical Work up  Expected Discharge Plan and Services Expected Discharge Plan: [place] In-house Referral: Clinical Social Work Discharge Planning Services: CM Consult Post Acute Care Choice: [place] Living arrangements for the past 2 months: [place] Expected Discharge Date: 11/24/18                           [place] Date [place]: 11/21/18 Time [place]: 1803 Representative spoke with at [place]: [place] (SDOH) Interventions    Readmission Risk Interventions No flowsheet data found.

## 2018-11-28 NOTE — Progress Notes (Addendum)
PROGRESS NOTE    Lindsey Norris  ZOX:[id number] DOB: 1926/11/17 DOA: 11/21/2018 PCP: Gaspar Garbe, MD   Brief Narrative: Lindsey Norris is a 85 y.o. female with history of hypertension, hearing loss possible dementia recently admitted for UTI , 3 weeks ago was brought to the ER after patient was found to be hypotensive.  Per report provided by the ER physician as patient is confused and at this time patient's care has been taken over by state since patient's husband was refusing to transfer patient to the ER, his goal was reportedly to keep her comfortable patient was found to be hypotensive by patient's home health aide and EMS was called the previous day but patient had refused transfer.  Again 9/25 patient was visited by the home health aide again found to be hypotensive at this time, APS was called, state took custody of patient's care and she was transferred to the ER ED Course: In the ER patient was hypotensive with a blood pressure in the 80s,  Labs show increased creatinine from baseline of 1.48 3 weeks ago weights around 1.1 with sodium of 130 UA concerning for UTI and chest x-ray showing some infiltrates.   COVID-19 test was negative.  -Hypotension has improved, PT OT recommending skilled nursing facility. Hospitalization complicated by intermittent bouts of SVT and AFib RVR that has not been sustaining so Cardiology was consulted for further evaluation and Recommendations. Palliative consulted for further GOC Discussion and assistance with APS situation.   Assessment & Plan:   Ethics: -This is a 85 year old female with dementia, failure to thrive, severe malnutrition who was brought to the emergency room under APS direction, emergency guardianship was obtained by APS just prior to this hospitalization -Palliative team following, per discussion with patient's spouse, their goal is for comfort focused care at home.  Patient keeps requesting to go home to her spouse. -Given  patient's cognitive decline, I am afraid she is at risk of more complications should she go to SNF, require excessive sedation, COVID exposure, and multitude of other potential complications as a result. -Our recommendations would for consideration of DNR and ideally home with hospice services, this was recommended to pts son as well -made 5 attempts to reach APS Allyn Kenner, Rulon Sera and Janith Lima without success, left numerous voicemails.  Hypotension  -Likely hypovolemia related, also noted to have UTI,?  Pneumonia cannot be ruled out she did have some streaky infiltrates on initial x-ray clinically improved with antibiotics and fluids -Blood cultures are negative urine culture grew Enterococcus faecalis, she was originally treated with IV ceftriaxone subsequently transitioned to Augmentin day 5 of antibiotics now -Blood pressure remained stable  Hyponatremia, improved -Improved with hydration, HCTZ discontinued   Enterococcus UTI versus colonization  -Recently had a UTI 3 weeks ago, cultures grew multiple species at the time -Urinalysis this admission showed large leukocytes, rare bacteria, 11-20 WBCs -Urine culture following reintubation now with Enterococcus faecalis, ceftriaxone changed to Augmentin day5 now  Left Basilar Pneumonia, poA -CXR showed- Mild left basilar atelectasis or infiltrate  -Repeat CXR showed "Suspected tiny pleural effusions. No change in airspace disease at the left base, atelectasis versus " -Rx with IV Ceftriaxone and Azithromycin changed to Augmentin -See discussion above, improved and stable from the standpoint  AKI, improved Elevated anion gap -Resolved with hydration, HCTZ discontinued  History of Hypertension  -HCTZ discontinued, BP stable  History of Depression -C/w Sertraline 25 mg po Daily   Normocytic Anemia -Stable, hemoglobin is stable  Hypokalemia -Repleted   Moderate Dementia with Behavior Disturbances -Started on  Sertraline 25 mg po Daily per Neurology for Mood Stabilization -Palliative Care Consulted and following  Intermittent SVT and A. fib RVR -Appreciate cardiology input, was on IV amiodarone, now changed to PO -Felt to be a poor candidate for anticoagulation  DVT prophylaxis: Enoxaparin 30 mg sq q24h Code Status: FULL CODE  Family Communication: No family at bedside, APS is POA, will recommend discussion of DNR Disposition Plan: To be determined  Consultants:   Palliative Care  Cardiology  Procedures:  ECHOCARDIOGRAM   Antimicrobials:  Anti-infectives (From admission, onward)   Start     Dose/Rate Route Frequency Ordered Stop   11/24/18 1700  amoxicillin-clavulanate (AUGMENTIN) 500-125 MG per tablet 500 mg     1 tablet Oral 2 times daily with meals 11/24/18 1419 11/27/18 0816   11/23/18 1800  azithromycin (ZITHROMAX) tablet 500 mg     500 mg Oral Every 24 hours 11/23/18 1616 11/25/18 1740   11/22/18 1800  cefTRIAXone (ROCEPHIN) 2 g in sodium chloride 0.9 % 100 mL IVPB  Status:  Discontinued     2 g 200 mL/hr over 30 Minutes Intravenous Every 24 hours 11/22/18 0143 11/24/18 1419   11/22/18 1800  azithromycin (ZITHROMAX) 500 mg in sodium chloride 0.9 % 250 mL IVPB  Status:  Discontinued     500 mg 250 mL/hr over 60 Minutes Intravenous Every 24 hours 11/22/18 0143 11/23/18 1614   11/21/18 1930  cefTRIAXone (ROCEPHIN) 1 g in sodium chloride 0.9 % 100 mL IVPB     1 g 200 mL/hr over 30 Minutes Intravenous  Once 11/21/18 1917 11/21/18 2106   11/21/18 1930  azithromycin (ZITHROMAX) 500 mg in sodium chloride 0.9 % 250 mL IVPB     500 mg 250 mL/hr over 60 Minutes Intravenous  Once 11/21/18 1917 11/22/18 0005     Subjective: -No events overnight, was upset about female nurse placing a purewick last night  Vitals:   11/27/18 2213 11/28/18 0652 11/28/18 0829 11/28/18 1233  BP: 128/78 (!) 152/93  117/64  Pulse: 86 80  92  Resp: 16 18  (!) 21  Temp: 98.1 F (36.7 C) 98.3 F (36.8 C)   98.4 F (36.9 C)  TempSrc: Oral Oral  Oral  SpO2: 100% 100% 96% 100%  Weight:      Height:        Intake/Output Summary (Last 24 hours) at 11/28/2018 1407 Last data filed at 11/27/2018 2100 Gross per 24 hour  Intake 100 ml  Output -  Net 100 ml   Filed Weights   11/21/18 1722 11/23/18 0443  Weight: 43.4 kg 40 kg   Examination:  Physical Exam:  Gen: Elderly frail chronically ill-appearing alert awake oriented to self only, pleasantly confused HEENT: PERRLA, Neck supple, no JVD Lungs: Good air movement bilaterally, CTAB CVS: RRR,No Gallops,Rubs or new Murmurs Abd: soft, Non tender, non distended, BS present Extremities: No edema Skin: no new rashes Psych, poor insight and judgment   Data Reviewed: I have personally reviewed following labs and imaging studies  CBC: Recent Labs  Lab 11/21/18 1747  11/22/18 0501 11/23/18 0350 11/24/18 0423 11/25/18 0405 11/27/18 0424  WBC 10.0   < > 8.0 10.1 10.4 8.4 7.7  NEUTROABS 6.1  --   --  7.5 7.9* 5.4  --   HGB 13.5   < > 11.7* 12.2 13.9 12.8 11.1*  HCT 40.8   < > 36.5 38.9 44.1 40.8 35.5*  MCV 90.5   < > 92.6 93.7 92.3 92.9 93.9  PLT 305   < > 221 225 255 251 175   < > = values in this interval not displayed.   Basic Metabolic Panel: Recent Labs  Lab 11/22/18 0501 11/23/18 0350 11/24/18 0423 11/25/18 0405 11/27/18 0424 11/28/18 0426  NA 136 138 136 135 136  --   K 3.2* 3.5 3.0* 3.6 4.1  --   CL 102 106 98 105 106  --   CO2 25 23 22 23 24   --   GLUCOSE 79 102* 108* 118* 93  --   BUN 13 9 5* 6* 10  --   CREATININE 0.81 0.70 0.53 0.59 0.61 0.61  CALCIUM 8.4* 8.4* 8.4* 8.2* 8.5*  --   MG 2.1 1.6* 1.7 1.6*  --   --   PHOS 3.3 2.9 2.2* 2.3*  --   --    GFR: Estimated Creatinine Clearance: 29.5 mL/min (by C-G formula based on SCr of 0.61 mg/dL). Liver Function Tests: Recent Labs  Lab 11/21/18 1747 11/22/18 0501 11/23/18 0350 11/24/18 0423 11/25/18 0405  AST 39 23 23 22 19   ALT 13 14 15 16 12   ALKPHOS 60 49  50 60 49  BILITOT 1.5* 0.7 0.5 0.7 0.7  PROT 6.6 5.2* 5.2* 6.3* 5.3*  ALBUMIN 3.8 2.9* 3.0* 3.3* 2.9*   No results for input(s): LIPASE, AMYLASE in the last 168 hours. No results for input(s): AMMONIA in the last 168 hours. Coagulation Profile: No results for input(s): INR, PROTIME in the last 168 hours. Cardiac Enzymes: No results for input(s): CKTOTAL, CKMB, CKMBINDEX, TROPONINI in the last 168 hours. BNP (last 3 results) No results for input(s): PROBNP in the last 8760 hours. HbA1C: No results for input(s): HGBA1C in the last 72 hours. CBG: Recent Labs  Lab 11/22/18 1209  GLUCAP 80   Lipid Profile: No results for input(s): CHOL, HDL, LDLCALC, TRIG, CHOLHDL, LDLDIRECT in the last 72 hours. Thyroid Function Tests: No results for input(s): TSH, T4TOTAL, FREET4, T3FREE, THYROIDAB in the last 72 hours. Anemia Panel: No results for input(s): VITAMINB12, FOLATE, FERRITIN, TIBC, IRON, RETICCTPCT in the last 72 hours. Sepsis Labs: Recent Labs  Lab 11/21/18 1746 11/21/18 2028  LATICACIDVEN 1.4 1.0    Recent Results (from the past 240 hour(s))  Blood culture (routine x 2)     Status: None   Collection Time: 11/21/18  5:46 PM   Specimen: BLOOD RIGHT ARM  Result Value Ref Range Status   Specimen Description   Final    BLOOD RIGHT ARM Performed at [facility] [address] [place], [place] [address]    Special Requests   Final    BOTTLES DRAWN AEROBIC AND ANAEROBIC Blood Culture adequate volume Performed at [facility] [address], [place], [place] [address]    Culture   Final    NO GROWTH 5 DAYS Performed at [facility], [place] [address] [place], [place] [address]    Report Status 11/26/2018 FINAL  Final  Blood culture (routine x 2)     Status: Abnormal   Collection Time: 11/21/18  5:47 PM   Specimen: BLOOD  Result Value Ref Range Status   Specimen Description   Final    BLOOD LEFT ANTECUBITAL Performed at [facility] [address], [place], [place] [address]    Special Requests   Final    BOTTLES DRAWN AEROBIC AND ANAEROBIC Blood Culture results  may not be optimal due to an inadequate volume of blood received in culture bottles Performed at [facility], [address] [place], [place] [address]    Culture  Setup Time   Final    GRAM POSITIVE COCCI AEROBIC BOTTLE ONLY CRITICAL RESULT CALLED TO, READ BACK BY AND VERIFIED WITH: PHARMD M BELL [id number] MLM    Culture (A)  Final    STAPHYLOCOCCUS SPECIES (COAGULASE NEGATIVE) THE SIGNIFICANCE OF ISOLATING THIS ORGANISM FROM A SINGLE SET OF BLOOD CULTURES WHEN MULTIPLE SETS ARE DRAWN IS UNCERTAIN. PLEASE NOTIFY THE MICROBIOLOGY DEPARTMENT WITHIN ONE WEEK IF SPECIATION AND SENSITIVITIES ARE REQUIRED. Performed at [facility], [address] [place], [place] [address]    Report Status 11/24/2018 FINAL  Final  Blood Culture ID Panel (Reflexed)     Status: Abnormal   Collection Time: 11/21/18  5:47 PM  Result Value Ref Range Status   Enterococcus species NOT DETECTED NOT DETECTED Final   Listeria monocytogenes NOT DETECTED NOT DETECTED Final   Staphylococcus species DETECTED (A) NOT DETECTED Final    Comment: Methicillin (oxacillin) resistant coagulase negative staphylococcus. Possible blood culture contaminant (unless isolated from more than one blood culture draw or clinical case suggests pathogenicity). No antibiotic treatment is indicated for blood  culture contaminants. CRITICAL RESULT CALLED TO, READ BACK BY AND VERIFIED WITH: PHARMD M BELL [id number] 1616 MLM    Staphylococcus aureus (BCID) NOT DETECTED NOT DETECTED Final   Methicillin resistance DETECTED (A) NOT DETECTED Final    Comment: CRITICAL RESULT CALLED TO, READ BACK BY AND VERIFIED WITH: PHARMD M BELL [id number] 1616 MLM    Streptococcus species NOT DETECTED NOT DETECTED Final   Streptococcus agalactiae NOT DETECTED NOT DETECTED Final    Streptococcus pneumoniae NOT DETECTED NOT DETECTED Final   Streptococcus pyogenes NOT DETECTED NOT DETECTED Final   Acinetobacter baumannii NOT DETECTED NOT DETECTED Final   Enterobacteriaceae species NOT DETECTED NOT DETECTED Final   Enterobacter cloacae complex NOT DETECTED NOT DETECTED Final   Escherichia coli NOT DETECTED NOT DETECTED Final   Klebsiella oxytoca NOT DETECTED NOT DETECTED Final   Klebsiella pneumoniae NOT DETECTED NOT DETECTED Final   Proteus species NOT DETECTED NOT DETECTED Final   Serratia marcescens NOT DETECTED NOT DETECTED Final   Haemophilus influenzae NOT DETECTED NOT DETECTED Final   Neisseria meningitidis NOT DETECTED NOT DETECTED Final   Pseudomonas aeruginosa NOT DETECTED NOT DETECTED Final   Candida albicans NOT DETECTED NOT DETECTED Final   Candida glabrata NOT DETECTED NOT DETECTED Final   Candida krusei NOT DETECTED NOT DETECTED Final   Candida parapsilosis NOT DETECTED NOT DETECTED Final   Candida tropicalis NOT DETECTED NOT DETECTED Final    Comment: Performed at [facility], [address] [place], [place] [address]  Urine culture     Status: Abnormal   Collection Time: 11/21/18  8:28 PM   Specimen: Urine, Clean Catch  Result Value Ref Range Status   Specimen Description   Final    URINE, CLEAN CATCH Performed at [facility], [address], [place], [place] [address]    Special Requests   Final    NONE Performed at [facility], [address] [place], [place] [address]    Culture >=100,000 COLONIES/mL ENTEROCOCCUS FAECALIS (A)  Final   Report Status 11/24/2018 FINAL  Final   Organism ID, Bacteria ENTEROCOCCUS FAECALIS (A)  Final      Susceptibility   Enterococcus faecalis -  MIC*    AMPICILLIN <=2 SENSITIVE Sensitive     LEVOFLOXACIN 0.5 SENSITIVE Sensitive     NITROFURANTOIN <=16 SENSITIVE Sensitive     VANCOMYCIN <=0.5 SENSITIVE Sensitive     * >=100,000 COLONIES/mL ENTEROCOCCUS FAECALIS   SARS Coronavirus 2 [facility] order, Performed in [facility] hospital lab) Nasopharyngeal Nasopharyngeal Swab     Status: None   Collection Time: 11/21/18  8:28 PM   Specimen: Nasopharyngeal Swab  Result Value Ref Range Status   SARS Coronavirus 2 NEGATIVE NEGATIVE Final    Comment: (NOTE) If result is NEGATIVE SARS-CoV-2 target nucleic acids are NOT DETECTED. The SARS-CoV-2 RNA is generally detectable in upper and lower  respiratory specimens during the acute phase of infection. The lowest  concentration of SARS-CoV-2 viral copies this assay can detect is 250  copies / mL. A negative result does not preclude SARS-CoV-2 infection  and should not be used as the sole basis for treatment or other  patient management decisions.  A negative result may occur with  improper specimen collection / handling, submission of specimen other  than nasopharyngeal swab, presence of viral mutation(s) within the  areas targeted by this assay, and inadequate number of viral copies  (<250 copies / mL). A negative result must be combined with clinical  observations, patient history, and epidemiological information. If result is POSITIVE SARS-CoV-2 target nucleic acids are DETECTED. The SARS-CoV-2 RNA is generally detectable in upper and lower  respiratory specimens dur ing the acute phase of infection.  Positive  results are indicative of active infection with SARS-CoV-2.  Clinical  correlation with patient history and other diagnostic information is  necessary to determine patient infection status.  Positive results do  not rule out bacterial infection or co-infection with other viruses. If result is PRESUMPTIVE POSTIVE SARS-CoV-2 nucleic acids MAY BE PRESENT.   A presumptive positive result was obtained on the submitted specimen  and confirmed on repeat testing.  While 2019 novel coronavirus  (SARS-CoV-2) nucleic acids may be present in the submitted sample  additional confirmatory testing may be  necessary for epidemiological  and / or clinical management purposes  to differentiate between  SARS-CoV-2 and other Sarbecovirus currently known to infect humans.  If clinically indicated additional testing with an alternate test  methodology [phone]) is advised. The SARS-CoV-2 RNA is generally  detectable in upper and lower respiratory sp ecimens during the acute  phase of infection. The expected result is Negative. Fact Sheet for Patients:  BoilerBrush.com.cy Fact Sheet for Healthcare Providers: https://pope.com/ This test is not yet approved or cleared by the [place] FDA and has been authorized for detection and/or diagnosis of SARS-CoV-2 by FDA under an Emergency Use Authorization (EUA).  This EUA will remain in effect (meaning this test can be used) for the duration of the COVID-19 declaration under Section 564(b)(1) of the Act, 21 U.S.C. section 360bbb-3(b)(1), unless the authorization is terminated or revoked sooner. Performed at [facility], [address] [place], [place] [address]   Culture, blood (routine x 2)     Status: None   Collection Time: 11/22/18  5:18 PM   Specimen: BLOOD  Result Value Ref Range Status   Specimen Description   Final    BLOOD LEFT ANTECUBITAL Performed at [facility], [address], [place], [place] [address]    Special Requests   Final    BOTTLES DRAWN AEROBIC ONLY Blood Culture adequate volume Performed at [facility], [address]  [place], [place] [address]    Culture   Final    NO GROWTH 5 DAYS Performed at [facility], [address], [place], [place] [address]    Report Status 11/27/2018 FINAL  Final  Culture, blood (routine x 2)     Status: None   Collection Time: 11/22/18  5:23 PM   Specimen: BLOOD LEFT FOREARM  Result Value Ref Range Status   Specimen Description   Final    BLOOD LEFT FOREARM Performed  at [facility], [address] [place], [place] [address]    Special Requests   Final    BOTTLES DRAWN AEROBIC ONLY Blood Culture adequate volume Performed at [facility], [address] [place], [place] [address]    Culture   Final    NO GROWTH 5 DAYS Performed at [facility], [address] [place], [place] [address]    Report Status 11/27/2018 FINAL  Final  Novel Coronavirus, NAA (hospital order; send-out to ref lab)     Status: None   Collection Time: 11/27/18  7:13 AM   Specimen: Nasopharyngeal Swab; Respiratory  Result Value Ref Range Status   SARS-CoV-2, NAA NOT DETECTED NOT DETECTED Final    Comment: (NOTE) This nucleic acid amplification test was developed and its performance characteristics determined by [employer]. Nucleic acid amplification tests include PCR and TMA. This test has not been FDA cleared or approved. This test has been authorized by FDA under an Emergency Use Authorization (EUA). This test is only authorized for the duration of time the declaration that circumstances exist justifying the authorization of the emergency use of in vitro diagnostic tests for detection of SARS-CoV-2 virus and/or diagnosis of COVID-19 infection under section 564(b)(1) of the Act, 21 U.S.C. 130QMV-7(Q) (1), unless the authorization is terminated or revoked sooner. When diagnostic testing is negative, the possibility of a false negative result should be considered in the context of a patient's recent exposures and the presence of clinical signs and symptoms consistent with COVID-19. An individual without symptoms of COVID- 19 and who is not shedding SARS-CoV-2 vi rus would expect to have a negative (not detected) result in this assay. Performed At: [facility] [address] [place], [place] [id number] Jolene Schimke MD UX:[id number]    Coronavirus Source NASOPHARYNGEAL  Final    Comment: Performed at [facility], [address], [place], [place] [address]    Radiology Studies: No results found. Scheduled Meds: . amiodarone  200 mg Oral BID  . [START ON 12/10/2018] amiodarone  200 mg Oral Daily  . enoxaparin (LOVENOX) injection  30 mg Subcutaneous Q24H  . feeding supplement  1 Container Oral BID BM  . feeding supplement (ENSURE ENLIVE)  237 mL Oral BID BM  . ipratropium  0.5 mg Nebulization BID  . levalbuterol  0.63 mg Nebulization BID  . multivitamin with minerals  1 tablet Oral Daily  . OLANZapine  2.5 mg Oral Daily  . pantoprazole  40 mg Oral Daily  . sertraline  25 mg Oral Daily   Continuous Infusions:   LOS: 6 days   Zannie Cove, MD [facility]  11/28/2018, 2:07 PM

## 2018-11-28 NOTE — Progress Notes (Addendum)
Daily Progress Note   Patient Name: Lindsey Norris       Date: 11/28/2018 DOB: Oct 07, 1926  Age: 85 y.o. MRN#: [id number] Attending Physician: Zannie Cove, MD Primary Care Physician: Gaspar Garbe, MD Admit Date: 11/21/2018  Reason for Consultation/Follow-up: Establishing goals of care  Subjective: Patient is awake but disoriented x4 She is resting in bed, she states she is not supposed to be here, that it is a mistake.  She asks for someone to stay in the room with her at all times.   Length of Stay: 6  Current Medications: Scheduled Meds:  . amiodarone  200 mg Oral BID  . [START ON 12/10/2018] amiodarone  200 mg Oral Daily  . enoxaparin (LOVENOX) injection  30 mg Subcutaneous Q24H  . feeding supplement  1 Container Oral BID BM  . feeding supplement (ENSURE ENLIVE)  237 mL Oral BID BM  . ipratropium  0.5 mg Nebulization BID  . levalbuterol  0.63 mg Nebulization BID  . multivitamin with minerals  1 tablet Oral Daily  . OLANZapine  2.5 mg Oral Daily  . pantoprazole  40 mg Oral Daily  . sertraline  25 mg Oral Daily    Continuous Infusions:   PRN Meds: acetaminophen **OR** acetaminophen, haloperidol lactate, ondansetron **OR** ondansetron (ZOFRAN) IV     Vital Signs: BP 117/64 (BP Location: Right Arm)   Pulse 92   Temp 98.4 F (36.9 C) (Oral)   Resp (!) 21   Ht 5\' 3"  (1.6 m)   Wt 40 kg   SpO2 100%   BMI 15.62 kg/m  SpO2: SpO2: 100 % O2 Device: O2 Device: Room Air O2 Flow Rate: O2 Flow Rate (L/min): 0 L/min  Intake/output summary:   Intake/Output Summary (Last 24 hours) at 11/28/2018 1407 Last data filed at 11/27/2018 2100 Gross per 24 hour  Intake 100 ml  Output -  Net 100 ml   LBM: Last BM Date: 11/27/18(per RN) Baseline Weight: Weight: 43.4 kg Most recent  weight: Weight: 40 kg       Palliative Assessment/Data: PPS 40% Awake alert Disoriented No distress She is frail Regular breath sounds S1 S2 Abdomen is not tender No edema    Flowsheet Rows     Most Recent Value  Intake Tab  Referral Department  Hospitalist  Unit at Time of Referral  Cardiac/Telemetry Unit  Palliative Care Primary Diagnosis  Neurology  Date Notified  11/23/18  Palliative Care Type  New Palliative care  Reason for referral  Clarify Goals of Care  Date of Admission  11/21/18  Date first seen by Palliative Care  11/25/18  # of days Palliative referral response time  2 Day(s)  # of days IP prior to Palliative referral  2  Clinical Assessment  Palliative Performance Scale Score  40%  Psychosocial & Spiritual Assessment  Palliative Care Outcomes  Patient/Family meeting held?  No  Palliative Care Outcomes  Linked to palliative care logitudinal support      Patient Active Problem List   Diagnosis Date Noted  . Dementia with behavioral disturbance (HCC)   . Goals of care, counseling/discussion   . Palliative care by specialist   . Hypotension 11/21/2018  . Weakness  10/27/2018  . Hypokalemia 10/27/2018  . UTI (urinary tract infection) 10/27/2018  . Hyponatremia 10/27/2018  . Seizures ([place]) 04/25/2017  . Syncope 04/25/2017  . Altered mental status 04/25/2017  . Arthritis   . Seasonal and perennial allergic rhinitis 05/09/2010  . Hyperlipemia 06/11/2007  . Hypertension 06/11/2007  . HEMORRHOIDS 06/11/2007  . COPD, MILD 06/11/2007  . OSTEOARTHRITIS 06/11/2007  . Personal history of colonic polyps 01/15/2007  . Asthma, mild intermittent, well-controlled 01/15/2007  . GERD 01/15/2007  . GASTRITIS, CHRONIC 04/22/2001    Palliative Care Assessment & Plan   HPI: 85 y.o. female  with past medical history of dementia and HTN admitted on 11/21/2018 with hypotension. UA concerning for UTI and CXR with some infiltrates. Patient thought to be dehydrated.  Patient with behavior disturbances r/t dementia. Also now with bursts of SVT - starting on amiodarone drip - being converted to PO amiodarone as of 9/30. Patient now under emergency APS custody d/t spouse not agreeing to transfer to ED, concern of unsafe living environment. PMT consulted for [place].   Assessment: 1. Ongoing attempts to reach APS have all been unsuccessful. We are attempting to establish contact to provide documentation regarding our recommendations to establish DNR DNI. The patient is frail, elderly with dementia and comorbidities and will not benefit in any way with a resuscitative attempt, in my opinion.   2. Recommend home with hospice, patient will likely not thrive in unfamiliar surroundings of SNF, will not be able to participate in a rehab attempt. She has a high risk of developing new complications, including but not limited to falls, delirium, infections.   I have concurrently discussed the above with TRH MD Dr. Broadus John as well as with Dr Hilma Favors, PMT Director, along with [place] who is also assisting [place].   Dr Broadus John and I have attempted to reach out to son and husband, unable to reach them at this time. It maybe that a goals of care meeting could be set up, over the weekend, to further discuss and better understand the patient's situation, home environment and to help figure out best possible disposition.     Code Status:  Full code - have requested change to DNR per spouse/patient's wishes - medically appropriate  Prognosis:   guarded, could be less than 6 months. Patient at risk for dehydration, infection, which could shorten her prognosis significantly, at which point, she will benefit from residential hospice.   Discharge Planning:  Recommend home with hospice. See above.   Care plan was discussed with   hospital social worker Caryl Pina, Dr. Broadus John, patient and as above.   Thank you for allowing the Palliative Medicine Team to assist in the care of this patient.    Total Time 40 minutes Prolonged Time Billed  no       Greater than 50%  of this time was spent counseling and coordinating care related to the above assessment and plan.   Loistine Chance MD Palliative Medicine Team Team Phone # [phone]   --- Addendum: A family meeting was held on 11-28-2018 from 1500 to 1530 with son and husband, along with TRH MD, in the patient's room. We talked about patient's current condition, code status, concepts specific to artificial nutrition and hydration, appropriate disposition. APS perspective also discussed. Offered support and voiced understanding that this is a complex situation, reassured family that goals remain to provide goal concordant, appropriate care to patient. See above.  Loistine Chance MD

## 2018-11-28 NOTE — Progress Notes (Signed)
Physical Therapy Treatment Patient Details Name: Lindsey Norris MRN: [id number] DOB: May 15, 1926 Today's Date: 11/28/2018    History of Present Illness Lindsey Norris is a 85 y.o. female with history of hypertension, hearing loss possible dementia recently admitted for UTI 3 weeks ago was brought to the ER after patient was found to be hypotensive.    PT Comments    Pt perseverating on talking to charge RN and getting w/c to leave hospital.  Pt assisted to/from [facility] and requiring at least mod assist for mobilizing at this time.  Continue to recommend SNF upon d/c.  RN notified of session.   Follow Up Recommendations  SNF     Equipment Recommendations  None recommended by PT    Recommendations for Other Services       Precautions / Restrictions Precautions Precautions: Fall Precaution Comments: Pt HOH, uses white board to communicate. Pt is very fearful of being left alone (per last admission)    Mobility  Bed Mobility Overal bed mobility: Needs Assistance Bed Mobility: Supine to Sit;Sit to Supine     Supine to sit: Max assist Sit to supine: Max assist   General bed mobility comments: manual cues as pt hesitant to perform to mobility  Transfers Overall transfer level: Needs assistance Equipment used: 2 person hand held assist Transfers: Sit to/from [employer] Sit to Stand: Mod assist;+2 physical assistance Stand pivot transfers: Mod assist;+2 physical assistance       General transfer comment: assisted to [facility] as pt reported needing to use however then stated no she didn't and wanted w/c to leave; pt assisted safely back to bed  Ambulation/Gait                 Stairs             Wheelchair Mobility    Modified Rankin (Stroke Patients Only)       Balance Overall balance assessment: Needs assistance         Standing balance support: Bilateral upper extremity supported Standing balance-Leahy Scale: Poor Standing  balance comment: requiring UE support and external assist                            Cognition Arousal/Alertness: Awake/alert Behavior During Therapy: Anxious Overall Cognitive Status: No family/caregiver present to determine baseline cognitive functioning                                 General Comments: h/o dementia.  Very HOH; repeatedly making same comments (today perseverating on being held in hospital against her will, asking to go to charge nurse office, wants to leave)      Exercises      General Comments        Pertinent Vitals/Pain Pain Assessment: Faces Faces Pain Scale: No hurt    Home Living                      Prior Function            PT Goals (current goals can now be found in the care plan section) Progress towards PT goals: Progressing toward goals    Frequency    Min 2X/week      PT Plan Current plan remains appropriate    Co-evaluation              AM-PAC PT "6  Clicks" Mobility   Outcome Measure  Help needed turning from your back to your side while in a flat bed without using bedrails?: A Lot Help needed moving from lying on your back to sitting on the side of a flat bed without using bedrails?: A Lot Help needed moving to and from a bed to a chair (including a wheelchair)?: Total Help needed standing up from a chair using your arms (e.g., wheelchair or bedside chair)?: Total Help needed to walk in hospital room?: Total Help needed climbing 3-5 steps with a railing? : Total 6 Click Score: 8    End of Session Equipment Utilized During Treatment: Gait belt Activity Tolerance: Other (comment)(limited by cognition) Patient left: in bed;with call bell/phone within reach;with bed alarm set Nurse Communication: Mobility status PT Visit Diagnosis: Unsteadiness on feet (R26.81);Muscle weakness (generalized) (M62.81);Other abnormalities of gait and mobility (R26.89)     Time: 5732-2025 PT Time  Calculation (min) (ACUTE ONLY): 16 min  Charges:  $Therapeutic Activity: 8-22 mins                     Carmelia Bake, PT, DPT Acute Rehabilitation Services Office: [phone] Pager: [phone]  Trena Platt 11/28/2018, 3:40 PM

## 2018-11-29 MED ORDER — HALOPERIDOL LACTATE 5 MG/ML IJ SOLN
2.0000 mg | Freq: Four times a day (QID) | INTRAMUSCULAR | Status: AC | PRN
  Administered 2018-11-29: 21:00:00 2 mg via INTRAVENOUS

## 2018-11-29 MED ORDER — LEVALBUTEROL HCL 0.63 MG/3ML IN NEBU: 0.6300 mg | INHALATION_SOLUTION | Freq: Four times a day (QID) | RESPIRATORY_TRACT | Status: DC | PRN

## 2018-11-29 MED ORDER — HALOPERIDOL LACTATE 5 MG/ML IJ SOLN
INTRAMUSCULAR | Status: AC
  Administered 2018-11-29: 21:00:00 2 mg via INTRAVENOUS
  Filled 2018-11-29: qty 1

## 2018-11-29 MED ORDER — IPRATROPIUM BROMIDE 0.02 % IN SOLN: 0.5000 mg | Freq: Four times a day (QID) | RESPIRATORY_TRACT | Status: DC | PRN

## 2018-11-29 NOTE — Progress Notes (Addendum)
No changes, extensive discussion with patient's son and spouse by myself and palliative MD Dr. Rowe Pavy yesterday evening. -Continue current care, we are hopeful that patient son will be able to obtain guardianship -He is thinking of considering discharge home with hospice, with additional support -Case management consult  Domenic Polite, MD

## 2018-11-29 NOTE — TOC Progression Note (Signed)
Transition of Care [facility]) - Progression Note    Patient Details  Name: Lindsey Norris MRN: [id number] Date of Birth: 17-Nov-1926  Transition of [facility]) CM/SW Contact  [address], [place], [place] Phone Number: 11/29/2018, 5:37 PM  Clinical Narrative:     This social made efforts to contact patient's guardian Samule Ohm [phone] through the Department of Social Services  as well as the intake department [phone] to discuss patient's code status and discharge needs with no return call. The after hours line also called [phone], however had to hag up X2 due to long hold time. Patient's son at bedside (lives out of state) and apparently he will be attending a court hearing next week where he will request guardianship of patient.   Sheralyn Boatman [facility] Care Management [phone]   Expected Discharge Plan: [place] Barriers to Discharge: Continued Medical Work up  Expected Discharge Plan and Services Expected Discharge Plan: [place] In-house Referral: Clinical Social Work Discharge Planning Services: CM Consult Post Acute Care Choice: [place] Living arrangements for the past 2 months: [place] Expected Discharge Date: 11/24/18                           [facility] Agency: [facility] Date [place]: 11/21/18 Time [place]: 1803 Representative spoke with at [place]: [place] (SDOH) Interventions    Readmission Risk Interventions No flowsheet data found.

## 2018-11-29 NOTE — Plan of Care (Signed)
  Problem: Education: Goal: Knowledge of General Education information will improve Description: Including pain rating scale, medication(s)/side effects and non-pharmacologic comfort measures Outcome: Not Progressing   

## 2018-11-30 MED ORDER — ALPRAZOLAM 0.25 MG PO TABS
0.2500 mg | ORAL_TABLET | Freq: Two times a day (BID) | ORAL | Status: DC | PRN
  Administered 2018-11-30 – 2018-12-04 (×5): 0.25 mg via ORAL
  Filled 2018-11-30 (×5): qty 1

## 2018-11-30 NOTE — Progress Notes (Addendum)
PROGRESS NOTE    Lindsey Norris  ASN:[id number] DOB: January 03, 1927 DOA: 11/21/2018 PCP: Lindsey Pao, MD   Brief Narrative: Lindsey Norris is a 85 y.o. female with history of hypertension, hearing loss possible dementia recently admitted for UTI , 3 weeks ago was brought to the ER after patient was found to be hypotensive.  Per report provided by the ER physician as patient is confused and at this time patient's care has been taken over by state since patient's husband was refusing to transfer patient to the ER, his goal was reportedly to keep her comfortable patient was found to be hypotensive by patient's home health aide and EMS was called the previous day but patient had refused transfer.  Again 9/25 patient was visited by the home health aide again found to be hypotensive at this time, APS was called, state took custody of patient's care and she was transferred to the ER ED Course: In the ER patient was hypotensive with a blood pressure in the 80s,  Labs show increased creatinine from baseline of 1.48 3 weeks ago weights around 1.1 with sodium of 130 UA concerning for UTI and chest x-ray showing some infiltrates.   COVID-19 test was negative.  -Hypotension has improved, PT OT recommending skilled nursing facility. Hospitalization complicated by intermittent bouts of SVT and AFib RVR that has not been sustaining so Cardiology was consulted for further evaluation and Recommendations. Palliative consulted for further GOC Discussion and assistance with APS situation.   Assessment & Plan:   Ethics: -This is a 85 year old female with dementia, failure to thrive, severe malnutrition who was brought to the emergency room under APS direction, emergency guardianship was obtained by APS just prior to this hospitalization -Palliative team following, per discussion with patient's spouse, their goal is for comfort focused care at home.  Patient keeps requesting to go home to her spouse. -Given  patient's cognitive decline, I am afraid she is at risk of more complications should she go to SNF, require excessive sedation, COVID exposure, and multitude of other potential complications as a result. -Our recommendations would for consideration of DNR and ideally home with hospice services, this was recommended to pts son as well -made 6 attempts to reach Lindsey Norris, Lindsey Norris and Lindsey Norris without success, left numerous voicemails on Friday -Palliative MD Dr. Rowe Norris and myself had an extensive discussion patient's son and spouse 10/2, we are hopeful that patient's son will be able to obtain guardianship, he is considering home with hospice with hopefully additional support, case management consulted as well -We are attempting to contact her APS guardians as well  Hypotension  -Likely hypovolemia related, also noted to have UTI,?  Pneumonia cannot be ruled out she did have some streaky infiltrates on initial x-ray clinically improved with antibiotics and fluids -Blood cultures are negative urine culture grew Enterococcus faecalis, she was originally treated with IV ceftriaxone subsequently transitioned to Augmentin day 6 of antibiotics now, stop after tomorrow's dose -Blood pressure remained stable  Hyponatremia, improved -Improved with hydration, HCTZ discontinued   Enterococcus UTI versus colonization  -Recently had a UTI 3 weeks ago, cultures grew multiple species at the time -Urinalysis this admission showed large leukocytes, rare bacteria, 11-20 WBCs -Urine culture following reintubation now with Enterococcus faecalis, ceftriaxone changed to Augmentin day6  now  Left Basilar Pneumonia, poA -CXR showed- Mild left basilar atelectasis or infiltrate  -Repeat CXR showed "Suspected tiny pleural effusions. No change in airspace disease at the left base, atelectasis  versus " -Rx with IV Ceftriaxone and Azithromycin changed to Augmentin -See discussion above, improved and  stable from the standpoint  Moderate Dementia with Behavior Disturbances -Started on Sertraline 25 mg po Daily per Neurology for Mood Stabilization -Palliative team following  Intermittent SVT and A. fib RVR -Appreciate cardiology input, was on IV amiodarone, now changed to PO -Felt to be a poor candidate for anticoagulation  Severe protein calorie malnutrition -Supplements as tolerated  AKI, improved Elevated anion gap -Resolved with hydration, HCTZ discontinued  History of Hypertension  -HCTZ discontinued, BP stable  History of Depression -C/w Sertraline 25 mg po Daily    Normocytic Anemia -Stable, hemoglobin is stable   Hypokalemia -Repleted   DVT prophylaxis: Enoxaparin 30 mg sq q24h Code Status: FULL CODE  Family Communication: No family at bedside, called and left message for son yesterday and today Disposition Plan: To be determined  Consultants:   Palliative Care  Cardiology  Procedures:  ECHOCARDIOGRAM   Antimicrobials:  Anti-infectives (From admission, onward)   Start     Dose/Rate Route Frequency Ordered Stop   11/24/18 1700  amoxicillin-clavulanate (AUGMENTIN) 500-125 MG per tablet 500 mg     1 tablet Oral 2 times daily with meals 11/24/18 1419 11/27/18 0816   11/23/18 1800  azithromycin (ZITHROMAX) tablet 500 mg     500 mg Oral Every 24 hours 11/23/18 1616 11/25/18 1740   11/22/18 1800  cefTRIAXone (ROCEPHIN) 2 g in sodium chloride 0.9 % 100 mL IVPB  Status:  Discontinued     2 g 200 mL/hr over 30 Minutes Intravenous Every 24 hours 11/22/18 0143 11/24/18 1419   11/22/18 1800  azithromycin (ZITHROMAX) 500 mg in sodium chloride 0.9 % 250 mL IVPB  Status:  Discontinued     500 mg 250 mL/hr over 60 Minutes Intravenous Every 24 hours 11/22/18 0143 11/23/18 1614   11/21/18 1930  cefTRIAXone (ROCEPHIN) 1 g in sodium chloride 0.9 % 100 mL IVPB     1 g 200 mL/hr over 30 Minutes Intravenous  Once 11/21/18 1917 11/21/18 2106   11/21/18 1930  azithromycin  (ZITHROMAX) 500 mg in sodium chloride 0.9 % 250 mL IVPB     500 mg 250 mL/hr over 60 Minutes Intravenous  Once 11/21/18 1917 11/22/18 0005     Subjective: -Remains pleasantly confused, extremely hard of hearing which complicates communication  Vitals:   11/29/18 1306 11/29/18 1955 11/29/18 2007 11/30/18 0624  BP: 128/68  (!) 105/91 (!) 147/88  Pulse: 87  91 77  Resp: (!) Temp: 98.3 F (36.8 C)  98.2 F (36.8 C) 97.8 F (36.6 C)  TempSrc: Oral  Oral Oral  SpO2: 99% 97% 96% 100%  Weight:      Height:        Intake/Output Summary (Last 24 hours) at 11/30/2018 1233 Last data filed at 11/30/2018 0600 Gross per 24 hour  Intake 0 ml  Output 350 ml  Net -350 ml   Filed Weights   11/21/18 1722 11/23/18 0443  Weight: 43.4 kg 40 kg   Examination:  Physical Exam:  Gen: Elderly frail chronically ill-appearing female, awake alert oriented to self only, pleasantly confused HEENT: PERRLA, Neck supple, no JVD Lungs: Decreased breath sounds bases CVS: RRR,No Gallops,Rubs or new Murmurs Abd: soft, Non tender, non distended, BS present Extremities: No edema Skin: no new rashes Psych, poor insight and judgment   Data Reviewed: I have personally reviewed following labs and imaging studies  CBC: Recent Labs  Lab 11/24/18 0423 11/25/18 0405 11/27/18 0424  WBC 10.4 8.4 7.7  NEUTROABS 7.9* 5.4  --   HGB 13.9 12.8 11.1*  HCT 44.1 40.8 35.5*  MCV 92.3 92.9 93.9  PLT 255 251 175   Basic Metabolic Panel: Recent Labs  Lab 11/24/18 0423 11/25/18 0405 11/27/18 0424 11/28/18 0426  NA 136 135 136  --   K 3.0* 3.6 4.1  --   CL 98 105 106  --   CO2 --   GLUCOSE 108* 118* 93  --   BUN 5* 6* 10  --   CREATININE 0.53 0.59 0.61 0.61  CALCIUM 8.4* 8.2* 8.5*  --   MG 1.7 1.6*  --   --   PHOS 2.2* 2.3*  --   --    GFR: Estimated Creatinine Clearance: 29.5 mL/min (by C-G formula based on SCr of 0.61 mg/dL). Liver Function Tests: Recent Labs  Lab 11/24/18  0423 11/25/18 0405  AST 22 19  ALT 16 12  ALKPHOS 60 49  BILITOT 0.7 0.7  PROT 6.3* 5.3*  ALBUMIN 3.3* 2.9*   No results for input(s): LIPASE, AMYLASE in the last 168 hours. No results for input(s): AMMONIA in the last 168 hours. Coagulation Profile: No results for input(s): INR, PROTIME in the last 168 hours. Cardiac Enzymes: No results for input(s): CKTOTAL, CKMB, CKMBINDEX, TROPONINI in the last 168 hours. BNP (last 3 results) No results for input(s): PROBNP in the last 8760 hours. HbA1C: No results for input(s): HGBA1C in the last 72 hours. CBG: No results for input(s): GLUCAP in the last 168 hours. Lipid Profile: No results for input(s): CHOL, HDL, LDLCALC, TRIG, CHOLHDL, LDLDIRECT in the last 72 hours. Thyroid Function Tests: No results for input(s): TSH, T4TOTAL, FREET4, T3FREE, THYROIDAB in the last 72 hours. Anemia Panel: No results for input(s): VITAMINB12, FOLATE, FERRITIN, TIBC, IRON, RETICCTPCT in the last 72 hours. Sepsis Labs: No results for input(s): PROCALCITON, LATICACIDVEN in the last 168 hours.  Recent Results (from the past 240 hour(s))  Blood culture (routine x 2)     Status: None   Collection Time: 11/21/18  5:46 PM   Specimen: BLOOD RIGHT ARM  Result Value Ref Range Status   Specimen Description   Final    BLOOD RIGHT ARM Performed at [facility], [address], [place], [place] [address]    Special Requests   Final    BOTTLES DRAWN AEROBIC AND ANAEROBIC Blood Culture adequate volume Performed at [facility], [address], [place], [place] [address]    Culture   Final    NO GROWTH 5 DAYS Performed at [facility], [address] [place], [place] [address]    Report Status 11/26/2018 FINAL  Final  Blood culture (routine x 2)     Status: Abnormal   Collection Time: 11/21/18  5:47 PM   Specimen: BLOOD  Result Value Ref Range Status   Specimen Description   Final    BLOOD LEFT ANTECUBITAL  Performed at [facility], [address] [place], [place] [address]    Special Requests   Final    BOTTLES DRAWN AEROBIC AND ANAEROBIC Blood Culture results may not be optimal due to an inadequate volume of blood received in culture bottles Performed at [facility], [address] [place], [place] [address]    Culture  Setup Time   Final    GRAM POSITIVE COCCI AEROBIC BOTTLE ONLY  CRITICAL RESULT CALLED TO, READ BACK BY AND VERIFIED WITH: PHARMD M BELL [id number] MLM    Culture (A)  Final    STAPHYLOCOCCUS SPECIES (COAGULASE NEGATIVE) THE SIGNIFICANCE OF ISOLATING THIS ORGANISM FROM A SINGLE SET OF BLOOD CULTURES WHEN MULTIPLE SETS ARE DRAWN IS UNCERTAIN. PLEASE NOTIFY THE MICROBIOLOGY DEPARTMENT WITHIN ONE WEEK IF SPECIATION AND SENSITIVITIES ARE REQUIRED. Performed at [facility], [address] [place], [place] [address]    Report Status 11/24/2018 FINAL  Final  Blood Culture ID Panel (Reflexed)     Status: Abnormal   Collection Time: 11/21/18  5:47 PM  Result Value Ref Range Status   Enterococcus species NOT DETECTED NOT DETECTED Final   Listeria monocytogenes NOT DETECTED NOT DETECTED Final   Staphylococcus species DETECTED (A) NOT DETECTED Final    Comment: Methicillin (oxacillin) resistant coagulase negative staphylococcus. Possible blood culture contaminant (unless isolated from more than one blood culture draw or clinical case suggests pathogenicity). No antibiotic treatment is indicated for blood  culture contaminants. CRITICAL RESULT CALLED TO, READ BACK BY AND VERIFIED WITH: PHARMD M BELL [id number] MLM    Staphylococcus aureus (BCID) NOT DETECTED NOT DETECTED Final   Methicillin resistance DETECTED (A) NOT DETECTED Final    Comment: CRITICAL RESULT CALLED TO, READ BACK BY AND VERIFIED WITH: PHARMD M BELL [id number] MLM    Streptococcus species NOT DETECTED NOT DETECTED Final   Streptococcus agalactiae NOT DETECTED NOT  DETECTED Final   Streptococcus pneumoniae NOT DETECTED NOT DETECTED Final   Streptococcus pyogenes NOT DETECTED NOT DETECTED Final   Acinetobacter baumannii NOT DETECTED NOT DETECTED Final   Enterobacteriaceae species NOT DETECTED NOT DETECTED Final   Enterobacter cloacae complex NOT DETECTED NOT DETECTED Final   Escherichia coli NOT DETECTED NOT DETECTED Final   Klebsiella oxytoca NOT DETECTED NOT DETECTED Final   Klebsiella pneumoniae NOT DETECTED NOT DETECTED Final   Proteus species NOT DETECTED NOT DETECTED Final   Serratia marcescens NOT DETECTED NOT DETECTED Final   Haemophilus influenzae NOT DETECTED NOT DETECTED Final   Neisseria meningitidis NOT DETECTED NOT DETECTED Final   Pseudomonas aeruginosa NOT DETECTED NOT DETECTED Final   Candida albicans NOT DETECTED NOT DETECTED Final   Candida glabrata NOT DETECTED NOT DETECTED Final   Candida krusei NOT DETECTED NOT DETECTED Final   Candida parapsilosis NOT DETECTED NOT DETECTED Final   Candida tropicalis NOT DETECTED NOT DETECTED Final    Comment: Performed at [facility], [address] [place], [place] [address]  Urine culture     Status: Abnormal   Collection Time: 11/21/18  8:28 PM   Specimen: Urine, Clean Catch  Result Value Ref Range Status   Specimen Description   Final    URINE, CLEAN CATCH Performed at [facility], [address] [place], [place] [address]    Special Requests   Final    NONE Performed at [facility], [address] [place], [place] [address]    Culture >=100,000 COLONIES/mL ENTEROCOCCUS FAECALIS (A)  Final   Report Status 11/24/2018 FINAL  Final   Organism ID, Bacteria ENTEROCOCCUS FAECALIS (A)  Final      Susceptibility   Enterococcus faecalis - MIC*    AMPICILLIN <=2 SENSITIVE Sensitive     LEVOFLOXACIN 0.5 SENSITIVE Sensitive     NITROFURANTOIN <=16 SENSITIVE Sensitive     VANCOMYCIN <=0.5 SENSITIVE Sensitive     * >=100,000 COLONIES/mL  ENTEROCOCCUS FAECALIS  SARS Coronavirus 2 [facility]  order, Performed in [facility] hospital lab) Nasopharyngeal Nasopharyngeal Swab     Status: None   Collection Time: 11/21/18  8:28 PM   Specimen: Nasopharyngeal Swab  Result Value Ref Range Status   SARS Coronavirus 2 NEGATIVE NEGATIVE Final    Comment: (NOTE) If result is NEGATIVE SARS-CoV-2 target nucleic acids are NOT DETECTED. The SARS-CoV-2 RNA is generally detectable in upper and lower  respiratory specimens during the acute phase of infection. The lowest  concentration of SARS-CoV-2 viral copies this assay can detect is 250  copies / mL. A negative result does not preclude SARS-CoV-2 infection  and should not be used as the sole basis for treatment or other  patient management decisions.  A negative result may occur with  improper specimen collection / handling, submission of specimen other  than nasopharyngeal swab, presence of viral mutation(s) within the  areas targeted by this assay, and inadequate number of viral copies  (<250 copies / mL). A negative result must be combined with clinical  observations, patient history, and epidemiological information. If result is POSITIVE SARS-CoV-2 target nucleic acids are DETECTED. The SARS-CoV-2 RNA is generally detectable in upper and lower  respiratory specimens dur ing the acute phase of infection.  Positive  results are indicative of active infection with SARS-CoV-2.  Clinical  correlation with patient history and other diagnostic information is  necessary to determine patient infection status.  Positive results do  not rule out bacterial infection or co-infection with other viruses. If result is PRESUMPTIVE POSTIVE SARS-CoV-2 nucleic acids MAY BE PRESENT.   A presumptive positive result was obtained on the submitted specimen  and confirmed on repeat testing.  While 2019 novel coronavirus  (SARS-CoV-2) nucleic acids may be present in the submitted sample  additional  confirmatory testing may be necessary for epidemiological  and / or clinical management purposes  to differentiate between  SARS-CoV-2 and other Sarbecovirus currently known to infect humans.  If clinically indicated additional testing with an alternate test  methodology [phone]) is advised. The SARS-CoV-2 RNA is generally  detectable in upper and lower respiratory sp ecimens during the acute  phase of infection. The expected result is Negative. Fact Sheet for Patients:  BoilerBrush.com.cy Fact Sheet for Healthcare Providers: https://pope.com/ This test is not yet approved or cleared by the [place] FDA and has been authorized for detection and/or diagnosis of SARS-CoV-2 by FDA under an Emergency Use Authorization (EUA).  This EUA will remain in effect (meaning this test can be used) for the duration of the COVID-19 declaration under Section 564(b)(1) of the Act, 21 U.S.C. section 360bbb-3(b)(1), unless the authorization is terminated or revoked sooner. Performed at [facility], [address] [place], [place] [address]   Culture, blood (routine x 2)     Status: None   Collection Time: 11/22/18  5:18 PM   Specimen: BLOOD  Result Value Ref Range Status   Specimen Description   Final    BLOOD LEFT ANTECUBITAL Performed at [facility], [address], [place], [place] [address]    Special Requests   Final    BOTTLES DRAWN AEROBIC ONLY Blood Culture adequate volume Performed at [facility], [address] [place], [place] [address]    Culture   Final    NO GROWTH 5 DAYS Performed at [facility], [address], [place], [place] [address]    Report Status 11/27/2018 FINAL  Final  Culture, blood (routine x 2)  Status: None   Collection Time: 11/22/18  5:23 PM   Specimen: BLOOD LEFT FOREARM  Result Value Ref Range Status   Specimen Description   Final     BLOOD LEFT FOREARM Performed at [facility], [address] [place], [place] [address]    Special Requests   Final    BOTTLES DRAWN AEROBIC ONLY Blood Culture adequate volume Performed at [facility], [address] [place], [place] [address]    Culture   Final    NO GROWTH 5 DAYS Performed at [facility], [address] [place], [place] [address]    Report Status 11/27/2018 FINAL  Final  Novel Coronavirus, NAA (hospital order; send-out to ref lab)     Status: None   Collection Time: 11/27/18  7:13 AM   Specimen: Nasopharyngeal Swab; Respiratory  Result Value Ref Range Status   SARS-CoV-2, NAA NOT DETECTED NOT DETECTED Final    Comment: (NOTE) This nucleic acid amplification test was developed and its performance characteristics determined by [facility]. Nucleic acid amplification tests include PCR and TMA. This test has not been FDA cleared or approved. This test has been authorized by FDA under an Emergency Use Authorization (EUA). This test is only authorized for the duration of time the declaration that circumstances exist justifying the authorization of the emergency use of in vitro diagnostic tests for detection of SARS-CoV-2 virus and/or diagnosis of COVID-19 infection under section 564(b)(1) of the Act, 21 U.S.C. 914NWG-9(F360bbb-3(b) (1), unless the authorization is terminated or revoked sooner. When diagnostic testing is negative, the possibility of a false negative result should be considered in the context of a patient's recent exposures and the presence of clinical signs and symptoms consistent with COVID-19. An individual without symptoms of COVID- 19 and who is not shedding SARS-CoV-2 vi rus would expect to have a negative (not detected) result in this assay. Performed At: [facility] [address] [place], [place] [id number] Jolene SchimkeNagendra Sanjai MD QI:[id number]:[phone]    Coronavirus Source NASOPHARYNGEAL  Final    Comment:  Performed at [facility], [address] [place], [place] [address]    Radiology Studies: No results found. Scheduled Meds: . amiodarone  200 mg Oral BID  . [START ON 12/10/2018] amiodarone  200 mg Oral Daily  . enoxaparin (LOVENOX) injection  30 mg Subcutaneous Q24H  . feeding supplement  1 Container Oral BID BM  . feeding supplement (ENSURE ENLIVE)  237 mL Oral BID BM  . multivitamin with minerals  1 tablet Oral Daily  . OLANZapine  2.5 mg Oral Daily  . pantoprazole  40 mg Oral Daily  . sertraline  25 mg Oral Daily   Continuous Infusions:   LOS: 8 days   Zannie CovePreetha Diann Bangerter, MD [facility]  11/30/2018, 12:33 PM

## 2018-12-01 NOTE — Progress Notes (Addendum)
Manufacturing engineer [facility]) Hospital Liaison: RN Note  Notified by Purcell Mouton, TOC/CMRN, of patient/family request for [facility] services at home after discharge. Chart and patient information reviewed by [facility] physician and Hospice eligibility has been confirmed.    Writer spoke with son Gwenlyn Fudge) via telephone to initiate education related to hospice philosophy, services and team approach to care. Son agreeable to go home with hospice this week once he sets up a private home health sitter in the home. Made son aware that [facility] volunteer support and some in person services have been effected by COVID and [place] may use telephonic support at times. Patient son verbalized understanding of information given.  Per discussion, plan is for discharge to home by PTAR on Tuesday or Wednesday of this week.  Please send signed and completed DNR form home if family decides to change code status from [place].    Patient will need prescriptions for discharge comfort medications.  DME needs have been discussed. Patient currently has the following equipment in the home: walker and wheelchair. Patient son requests the following DME for delivery to the home: full rail hospital bed with over the bed table and a bedside commode.  The [facility] DME specialist has been notified and will contact [employer] to arrange delivery to the home. Home address has been verified and is correct in the chart. Patient son Gwenlyn Fudge is the family member to contact to arrange time of delivery. [facility] aware of the above.   Please fax completed discharge summary to [facility] at [phone] when final.   Please notify [facility] when patient is ready to leave the unit at discharge. (Call [phone] between 8:30 and 5pm. Call [phone] after 5pm.) [facility] information and contact numbers given to patient son Gwenlyn Fudge during this visit.  Above information shared with [facility] TOC/CMRN.  Please call with hospice related questions.  Thank you for this  referral,  Gar Ponto, RN [facility] Liaison [phone] [facility] Liaisons are listed on AMION)

## 2018-12-01 NOTE — Progress Notes (Signed)
PROGRESS NOTE    Lindsey Norris  ZOX:[id number] DOB: 1926/06/21 DOA: 11/21/2018 PCP: Gaspar Garbe, MD   Brief Narrative: Lindsey Norris is a 85 y.o. female with history of hypertension, hearing loss possible dementia recently admitted for UTI , 3 weeks ago was brought to the ER after patient was found to be hypotensive.  Per report provided by the ER physician as patient is confused and at this time patient's care has been taken over by state since patient's husband was refusing to transfer patient to the ER, his goal was reportedly to keep her comfortable patient was found to be hypotensive by patient's home health aide and EMS was called the previous day but patient had refused transfer.  Again 9/25 patient was visited by the home health aide again found to be hypotensive at this time, APS was called, state took custody of patient's care and she was transferred to the ER ED Course: In the ER patient was hypotensive with a blood pressure in the 80s,  Labs show increased creatinine from baseline of 1.48 3 weeks ago weights around 1.1 with sodium of 130 UA concerning for UTI and chest x-ray showing some infiltrates.   COVID-19 test was negative.  -Hypotension has improved, PT OT recommending skilled nursing facility. Hospitalization complicated by intermittent bouts of SVT and AFib RVR that has not been sustaining so Cardiology was consulted for further evaluation and Recommendations. Palliative consulted for further GOC Discussion and assistance with APS situation.  -Initially plan was for possible discharge home with Hospice and caregiver support however patient son has changed his mind, he is hoping to obtain guardianship and he wishes the patient to be a full code and have a feeding tube if indicated, pending APS approval  Assessment & Plan:   Ethics: -This is a 85 year old female with dementia, failure to thrive, severe malnutrition who was brought to the emergency room under APS  direction, emergency guardianship was obtained by APS just prior to this hospitalization -Palliative team following, originally the goal of care was comfort focused, patient keeps requesting to go home to her spouse. -Given patient's cognitive decline, I am afraid she is at risk of more complications should she go to SNF, require excessive sedation, COVID exposure, and multitude of other potential complications as a result. -Our recommendations would for consideration of DNR and ideally home with hospice services, this was recommended to pts son as well -Palliative MD Dr. Linna Darner and myself had an extensive discussion patient's son and spouse 10/2, we are hopeful that patient's son will be able to obtain guardianship, he was considering home with hospice with hopefully additional support on Friday however it appears that his goals are different now, per recent email sent to staff RN this weekend son wishes pt to be FUll Code and full scope of treatment, including feeding tube if indicated -Will plan on discharge home with home health services with palliative care follow-up and caregiver support as arranged by family  Hypotension  -Likely hypovolemia related, also noted to have UTI,?  Pneumonia cannot be ruled out she did have some streaky infiltrates on initial x-ray clinically improved with antibiotics and fluids -Blood cultures are negative urine culture grew Enterococcus faecalis, she was originally treated with IV ceftriaxone subsequently transitioned to Augmentin completed 7 days of antibiotics, now discontinued -BP remained stable  Hyponatremia, improved -Improved with hydration, HCTZ discontinued   Enterococcus UTI versus colonization  -Recently had a UTI 3 weeks ago, now UA was abnormal on this  admission, -Urine culture following reintubation now with Enterococcus faecalis, antibiotics completed as above  Left Basilar Pneumonia, poA -CXR showed- Mild left basilar atelectasis or infiltrate   --Rx with IV Ceftriaxone and Azithromycin changed to Augmentin -See discussion above, improved and stable from the standpoint, antibiotics completed  Moderate Dementia with Behavior Disturbances -Started on Sertraline 25 mg po Daily per Neurology for Mood Stabilization -Palliative team following  Intermittent SVT and A. fib RVR -Appreciate cardiology input, was on IV amiodarone, now changed to PO -Felt to be a poor candidate for anticoagulation  Severe protein calorie malnutrition -Supplements as tolerated  AKI, improved Elevated anion gap -Resolved with hydration, HCTZ discontinued  History of Hypertension  -HCTZ discontinued, BP stable  History of Depression -C/w Sertraline 25 mg po Daily    Normocytic Anemia -Stable, hemoglobin is stable   Hypokalemia -Repleted   DVT prophylaxis: Enoxaparin 30 mg sq q24h Code Status: FULL CODE  Family Communication: No family at bedside, called and left message for son 10/3 and 10/4 Disposition Plan: Home with home health services, pending APS approval with caregiver support  Consultants:   Palliative Care  Cardiology  Procedures:  ECHOCARDIOGRAM   Antimicrobials:  Anti-infectives (From admission, onward)   Start     Dose/Rate Route Frequency Ordered Stop   11/24/18 1700  amoxicillin-clavulanate (AUGMENTIN) 500-125 MG per tablet 500 mg     1 tablet Oral 2 times daily with meals 11/24/18 1419 11/27/18 0816   11/23/18 1800  azithromycin (ZITHROMAX) tablet 500 mg     500 mg Oral Every 24 hours 11/23/18 1616 11/25/18 1740   11/22/18 1800  cefTRIAXone (ROCEPHIN) 2 g in sodium chloride 0.9 % 100 mL IVPB  Status:  Discontinued     2 g 200 mL/hr over 30 Minutes Intravenous Every 24 hours 11/22/18 0143 11/24/18 1419   11/22/18 1800  azithromycin (ZITHROMAX) 500 mg in sodium chloride 0.9 % 250 mL IVPB  Status:  Discontinued     500 mg 250 mL/hr over 60 Minutes Intravenous Every 24 hours 11/22/18 0143 11/23/18 1614   11/21/18 1930   cefTRIAXone (ROCEPHIN) 1 g in sodium chloride 0.9 % 100 mL IVPB     1 g 200 mL/hr over 30 Minutes Intravenous  Once 11/21/18 1917 11/21/18 2106   11/21/18 1930  azithromycin (ZITHROMAX) 500 mg in sodium chloride 0.9 % 250 mL IVPB     500 mg 250 mL/hr over 60 Minutes Intravenous  Once 11/21/18 1917 11/22/18 0005     Subjective: -Remains pleasantly confused, hollering to go home -Says that she needs to see her dad who is 86, I tried to remind her that she is 85 years old herself  Vitals:   11/30/18 1457 11/30/18 2203 12/01/18 0502 12/01/18 1205  BP:  (!) 101/56 117/62   Pulse:  95 90   Resp:  16 18   Temp:  98.2 F (36.8 C) 98.8 F (37.1 C)   TempSrc:  Oral Oral   SpO2: 97% 97% 96%   Weight:    38.1 kg  Height:        Intake/Output Summary (Last 24 hours) at 12/01/2018 1422 Last data filed at 11/30/2018 2344 Gross per 24 hour  Intake -  Output 100 ml  Net -100 ml   Filed Weights   11/21/18 1722 11/23/18 0443 12/01/18 1205  Weight: 43.4 kg 40 kg 38.1 kg   Examination:  Physical Exam:  Gen: Elderly frail, chronically ill female, very hard of hearing, awake alert oriented to  self only, remains pleasantly confused HEENT: PERRLA, Neck supple, no JVD Lungs: Clear anteriorly CVS: RRR,No Gallops,Rubs or new Murmurs Abd: soft, Non tender, non distended, BS present Extremities: No edema, extremely cachectic Skin: no new rashes Psych, poor insight and judgment   Data Reviewed: I have personally reviewed following labs and imaging studies  CBC: Recent Labs  Lab 11/25/18 0405 11/27/18 0424  WBC 8.4 7.7  NEUTROABS 5.4  --   HGB 12.8 11.1*  HCT 40.8 35.5*  MCV 92.9 93.9  PLT 251 175   Basic Metabolic Panel: Recent Labs  Lab 11/25/18 0405 11/27/18 0424 11/28/18 0426  NA 135 136  --   K 3.6 4.1  --   CL 105 106  --   CO2 23 24  --   GLUCOSE 118* 93  --   BUN 6* 10  --   CREATININE 0.59 0.61 0.61  CALCIUM 8.2* 8.5*  --   MG 1.6*  --   --   PHOS 2.3*  --   --     GFR: Estimated Creatinine Clearance: 28.1 mL/min (by C-G formula based on SCr of 0.61 mg/dL). Liver Function Tests: Recent Labs  Lab 11/25/18 0405  AST 19  ALT 12  ALKPHOS 49  BILITOT 0.7  PROT 5.3*  ALBUMIN 2.9*   No results for input(s): LIPASE, AMYLASE in the last 168 hours. No results for input(s): AMMONIA in the last 168 hours. Coagulation Profile: No results for input(s): INR, PROTIME in the last 168 hours. Cardiac Enzymes: No results for input(s): CKTOTAL, CKMB, CKMBINDEX, TROPONINI in the last 168 hours. BNP (last 3 results) No results for input(s): PROBNP in the last 8760 hours. HbA1C: No results for input(s): HGBA1C in the last 72 hours. CBG: No results for input(s): GLUCAP in the last 168 hours. Lipid Profile: No results for input(s): CHOL, HDL, LDLCALC, TRIG, CHOLHDL, LDLDIRECT in the last 72 hours. Thyroid Function Tests: No results for input(s): TSH, T4TOTAL, FREET4, T3FREE, THYROIDAB in the last 72 hours. Anemia Panel: No results for input(s): VITAMINB12, FOLATE, FERRITIN, TIBC, IRON, RETICCTPCT in the last 72 hours. Sepsis Labs: No results for input(s): PROCALCITON, LATICACIDVEN in the last 168 hours.  Recent Results (from the past 240 hour(s))  Blood culture (routine x 2)     Status: None   Collection Time: 11/21/18  5:46 PM   Specimen: BLOOD RIGHT ARM  Result Value Ref Range Status   Specimen Description   Final    BLOOD RIGHT ARM Performed at [facility], [address] [place], [place] [address]    Special Requests   Final    BOTTLES DRAWN AEROBIC AND ANAEROBIC Blood Culture adequate volume Performed at [facility], [address] [place], [place] [address]    Culture   Final    NO GROWTH 5 DAYS Performed at [facility], [address] [place], [place] [address]    Report Status 11/26/2018 FINAL  Final  Blood culture (routine x 2)     Status: Abnormal   Collection Time: 11/21/18  5:47 PM    Specimen: BLOOD  Result Value Ref Range Status   Specimen Description   Final    BLOOD LEFT ANTECUBITAL Performed at [facility], [address], [place], [place] [address]    Special Requests   Final    BOTTLES DRAWN AEROBIC AND ANAEROBIC Blood Culture results may not be optimal due to an inadequate volume of blood received in culture bottles  Performed at [facility], [address] [place], [place] [address]    Culture  Setup Time   Final    GRAM POSITIVE COCCI AEROBIC BOTTLE ONLY CRITICAL RESULT CALLED TO, READ BACK BY AND VERIFIED WITH: PHARMD M BELL [id number] MLM    Culture (A)  Final    STAPHYLOCOCCUS SPECIES (COAGULASE NEGATIVE) THE SIGNIFICANCE OF ISOLATING THIS ORGANISM FROM A SINGLE SET OF BLOOD CULTURES WHEN MULTIPLE SETS ARE DRAWN IS UNCERTAIN. PLEASE NOTIFY THE MICROBIOLOGY DEPARTMENT WITHIN ONE WEEK IF SPECIATION AND SENSITIVITIES ARE REQUIRED. Performed at [facility], [address] [place], [place] [address]    Report Status 11/24/2018 FINAL  Final  Blood Culture ID Panel (Reflexed)     Status: Abnormal   Collection Time: 11/21/18  5:47 PM  Result Value Ref Range Status   Enterococcus species NOT DETECTED NOT DETECTED Final   Listeria monocytogenes NOT DETECTED NOT DETECTED Final   Staphylococcus species DETECTED (A) NOT DETECTED Final    Comment: Methicillin (oxacillin) resistant coagulase negative staphylococcus. Possible blood culture contaminant (unless isolated from more than one blood culture draw or clinical case suggests pathogenicity). No antibiotic treatment is indicated for blood  culture contaminants. CRITICAL RESULT CALLED TO, READ BACK BY AND VERIFIED WITH: PHARMD M BELL [id number] MLM    Staphylococcus aureus (BCID) NOT DETECTED NOT DETECTED Final   Methicillin resistance DETECTED (A) NOT DETECTED Final    Comment: CRITICAL RESULT CALLED TO, READ BACK BY AND VERIFIED WITH: PHARMD M BELL [id number]  MLM    Streptococcus species NOT DETECTED NOT DETECTED Final   Streptococcus agalactiae NOT DETECTED NOT DETECTED Final   Streptococcus pneumoniae NOT DETECTED NOT DETECTED Final   Streptococcus pyogenes NOT DETECTED NOT DETECTED Final   Acinetobacter baumannii NOT DETECTED NOT DETECTED Final   Enterobacteriaceae species NOT DETECTED NOT DETECTED Final   Enterobacter cloacae complex NOT DETECTED NOT DETECTED Final   Escherichia coli NOT DETECTED NOT DETECTED Final   Klebsiella oxytoca NOT DETECTED NOT DETECTED Final   Klebsiella pneumoniae NOT DETECTED NOT DETECTED Final   Proteus species NOT DETECTED NOT DETECTED Final   Serratia marcescens NOT DETECTED NOT DETECTED Final   Haemophilus influenzae NOT DETECTED NOT DETECTED Final   Neisseria meningitidis NOT DETECTED NOT DETECTED Final   Pseudomonas aeruginosa NOT DETECTED NOT DETECTED Final   Candida albicans NOT DETECTED NOT DETECTED Final   Candida glabrata NOT DETECTED NOT DETECTED Final   Candida krusei NOT DETECTED NOT DETECTED Final   Candida parapsilosis NOT DETECTED NOT DETECTED Final   Candida tropicalis NOT DETECTED NOT DETECTED Final    Comment: Performed at [facility], [address] [place], [place] [address]  Urine culture     Status: Abnormal   Collection Time: 11/21/18  8:28 PM   Specimen: Urine, Clean Catch  Result Value Ref Range Status   Specimen Description   Final    URINE, CLEAN CATCH Performed at [facility], [address] [place], [place] [address]    Special Requests   Final    NONE Performed at [facility], [address] [place], [place] [address]    Culture >=100,000 COLONIES/mL ENTEROCOCCUS FAECALIS (A)  Final   Report Status 11/24/2018 FINAL  Final   Organism ID, Bacteria ENTEROCOCCUS FAECALIS (A)  Final      Susceptibility   Enterococcus faecalis - MIC*    AMPICILLIN <=2 SENSITIVE Sensitive     LEVOFLOXACIN 0.5 SENSITIVE  Sensitive      NITROFURANTOIN <=16 SENSITIVE Sensitive     VANCOMYCIN <=0.5 SENSITIVE Sensitive     * >=100,000 COLONIES/mL ENTEROCOCCUS FAECALIS  SARS Coronavirus 2 [facility] order, Performed in [facility] hospital lab) Nasopharyngeal Nasopharyngeal Swab     Status: None   Collection Time: 11/21/18  8:28 PM   Specimen: Nasopharyngeal Swab  Result Value Ref Range Status   SARS Coronavirus 2 NEGATIVE NEGATIVE Final    Comment: (NOTE) If result is NEGATIVE SARS-CoV-2 target nucleic acids are NOT DETECTED. The SARS-CoV-2 RNA is generally detectable in upper and lower  respiratory specimens during the acute phase of infection. The lowest  concentration of SARS-CoV-2 viral copies this assay can detect is 250  copies / mL. A negative result does not preclude SARS-CoV-2 infection  and should not be used as the sole basis for treatment or other  patient management decisions.  A negative result may occur with  improper specimen collection / handling, submission of specimen other  than nasopharyngeal swab, presence of viral mutation(s) within the  areas targeted by this assay, and inadequate number of viral copies  (<250 copies / mL). A negative result must be combined with clinical  observations, patient history, and epidemiological information. If result is POSITIVE SARS-CoV-2 target nucleic acids are DETECTED. The SARS-CoV-2 RNA is generally detectable in upper and lower  respiratory specimens dur ing the acute phase of infection.  Positive  results are indicative of active infection with SARS-CoV-2.  Clinical  correlation with patient history and other diagnostic information is  necessary to determine patient infection status.  Positive results do  not rule out bacterial infection or co-infection with other viruses. If result is PRESUMPTIVE POSTIVE SARS-CoV-2 nucleic acids MAY BE PRESENT.   A presumptive positive result was obtained on the submitted specimen  and confirmed on repeat testing.  While  2019 novel coronavirus  (SARS-CoV-2) nucleic acids may be present in the submitted sample  additional confirmatory testing may be necessary for epidemiological  and / or clinical management purposes  to differentiate between  SARS-CoV-2 and other Sarbecovirus currently known to infect humans.  If clinically indicated additional testing with an alternate test  methodology [phone]) is advised. The SARS-CoV-2 RNA is generally  detectable in upper and lower respiratory sp ecimens during the acute  phase of infection. The expected result is Negative. Fact Sheet for Patients:  StrictlyIdeas.no Fact Sheet for Healthcare Providers: BankingDealers.co.za This test is not yet approved or cleared by the [place] FDA and has been authorized for detection and/or diagnosis of SARS-CoV-2 by FDA under an Emergency Use Authorization (EUA).  This EUA will remain in effect (meaning this test can be used) for the duration of the COVID-19 declaration under Section 564(b)(1) of the Act, 21 U.S.C. section 360bbb-3(b)(1), unless the authorization is terminated or revoked sooner. Performed at [facility], [place] [address] [place], [place] [address]   Culture, blood (routine x 2)     Status: None   Collection Time: 11/22/18  5:18 PM   Specimen: BLOOD  Result Value Ref Range Status   Specimen Description   Final    BLOOD LEFT ANTECUBITAL Performed at [facility] [address], [place], [place] [address]    Special Requests   Final    BOTTLES DRAWN AEROBIC ONLY Blood Culture adequate volume Performed at [place] [address] [place], [place] [address]    Culture   Final    NO GROWTH  5 DAYS Performed at [facility], [address] [place], [place] [address]    Report Status 11/27/2018 FINAL  Final  Culture, blood (routine x 2)     Status: None   Collection Time: 11/22/18  5:23  PM   Specimen: BLOOD LEFT FOREARM  Result Value Ref Range Status   Specimen Description   Final    BLOOD LEFT FOREARM Performed at [facility], [address], [place], [place] [address]    Special Requests   Final    BOTTLES DRAWN AEROBIC ONLY Blood Culture adequate volume Performed at [facility], [address] [place], [place] [address]    Culture   Final    NO GROWTH 5 DAYS Performed at [facility], [address] [place], [place] [address]    Report Status 11/27/2018 FINAL  Final  Novel Coronavirus, NAA (hospital order; send-out to ref lab)     Status: None   Collection Time: 11/27/18  7:13 AM   Specimen: Nasopharyngeal Swab; Respiratory  Result Value Ref Range Status   SARS-CoV-2, NAA NOT DETECTED NOT DETECTED Final    Comment: (NOTE) This nucleic acid amplification test was developed and its performance characteristics determined by [employer]. Nucleic acid amplification tests include PCR and TMA. This test has not been FDA cleared or approved. This test has been authorized by FDA under an Emergency Use Authorization (EUA). This test is only authorized for the duration of time the declaration that circumstances exist justifying the authorization of the emergency use of in vitro diagnostic tests for detection of SARS-CoV-2 virus and/or diagnosis of COVID-19 infection under section 564(b)(1) of the Act, 21 U.S.C. 702OVZ-8(H) (1), unless the authorization is terminated or revoked sooner. When diagnostic testing is negative, the possibility of a false negative result should be considered in the context of a patient's recent exposures and the presence of clinical signs and symptoms consistent with COVID-19. An individual without symptoms of COVID- 19 and who is not shedding SARS-CoV-2 vi rus would expect to have a negative (not detected) result in this assay. Performed At: [facility] [address] [place], [place]  [id number] Jolene Schimke MD OI:[id number]    Coronavirus Source NASOPHARYNGEAL  Final    Comment: Performed at [facility], [address] [place], [place] [address]    Radiology Studies: No results found. Scheduled Meds: . amiodarone  200 mg Oral BID  . [START ON 12/10/2018] amiodarone  200 mg Oral Daily  . enoxaparin (LOVENOX) injection  30 mg Subcutaneous Q24H  . feeding supplement  1 Container Oral BID BM  . feeding supplement (ENSURE ENLIVE)  237 mL Oral BID BM  . multivitamin with minerals  1 tablet Oral Daily  . OLANZapine  2.5 mg Oral Daily  . pantoprazole  40 mg Oral Daily  . sertraline  25 mg Oral Daily   Continuous Infusions:   LOS: 9 days   Zannie Cove, MD [facility]  12/01/2018, 2:22 PM

## 2018-12-01 NOTE — TOC Progression Note (Signed)
Transition of Care [facility]) - Progression Note    Patient Details  Name: Lindsey Norris MRN: [id number] Date of Birth: 08-29-1926  [facility] CM/SW Contact  Purcell Mouton, RN Phone Number: 12/01/2018, 3:20 PM  Clinical Narrative:    Spoke with pt's son Gwenlyn Fudge [phone] concerning discharge plans. Explained to so HH, each disciple, [facility], [place], Hospice and that is as far as he wanted to go. Son continued to state that he would call [facility] for information on what was covered in pt's plan. Gwenlyn Fudge states that he want Hospice and selected [facility] [place]. Referral given to Judeen Hammans, RN with [facility].   Expected Discharge Plan: [place] Barriers to Discharge: Continued Medical Work up  Expected Discharge Plan and Services Expected Discharge Plan: [place] In-house Referral: Clinical Social Work Discharge Planning Services: CM Consult Post Acute Care Choice: [place] Living arrangements for the past 2 months: [place] Expected Discharge Date: 11/24/18                           [facility] Agency: [facility] Date [place]: 11/21/18 Time [place]: 1803 Representative spoke with at [place]: [place] (SDOH) Interventions    Readmission Risk Interventions No flowsheet data found.

## 2018-12-01 NOTE — Care Management Important Message (Signed)
Important Message  Patient Details IM Letter given to [name] RN to present to the Patient Name: Lindsey Norris MRN: [id number] Date of Birth: 20-Dec-1926   Medicare Important Message Given:  Yes     [name] 12/01/2018, 10:49 AM

## 2018-12-01 NOTE — Progress Notes (Signed)
Daily Progress Note   Patient Name: Lindsey Norris       Date: 12/01/2018 DOB: 1926/08/23  Age: 85 y.o. MRN#: [id number] Attending Physician: Zannie Cove, MD Primary Care Physician: Gaspar Garbe, MD Admit Date: 11/21/2018  Reason for Consultation/Follow-up: Establishing goals of care  Subjective: Patient is awake but disoriented x4 She is resting in bed, she has been moved closer to the nurses station, she is in no distress, she was stating that her parents are dying and was confused over the weekend.    Length of Stay: 9  Current Medications: Scheduled Meds:  . amiodarone  200 mg Oral BID  . [START ON 12/10/2018] amiodarone  200 mg Oral Daily  . enoxaparin (LOVENOX) injection  30 mg Subcutaneous Q24H  . feeding supplement  1 Container Oral BID BM  . feeding supplement (ENSURE ENLIVE)  237 mL Oral BID BM  . multivitamin with minerals  1 tablet Oral Daily  . OLANZapine  2.5 mg Oral Daily  . pantoprazole  40 mg Oral Daily  . sertraline  25 mg Oral Daily    Continuous Infusions:   PRN Meds: acetaminophen **OR** acetaminophen, ALPRAZolam, ipratropium, levalbuterol, ondansetron **OR** ondansetron (ZOFRAN) IV     Vital Signs: BP 117/62 (BP Location: Right Arm)   Pulse 90   Temp 98.8 F (37.1 C) (Oral)   Resp 18   Ht 5\' 3"  (1.6 m)   Wt 40 kg   SpO2 96%   BMI 15.62 kg/m  SpO2: SpO2: 96 % O2 Device: O2 Device: Room Air O2 Flow Rate: O2 Flow Rate (L/min): 0 L/min  Intake/output summary:   Intake/Output Summary (Last 24 hours) at 12/01/2018 1206 Last data filed at 11/30/2018 2344 Gross per 24 hour  Intake -  Output 350 ml  Net -350 ml   LBM: Last BM Date: 11/30/18 Baseline Weight: Weight: 43.4 kg Most recent weight: Weight: 40 kg       Palliative Assessment/Data: PPS  40% Awake alert Disoriented No distress She is frail Regular breath sounds S1 S2 Abdomen is not tender No edema    Flowsheet Rows     Most Recent Value  Intake Tab  Referral Department  Hospitalist  Unit at Time of Referral  Cardiac/Telemetry Unit  Palliative Care Primary Diagnosis  Neurology  Date Notified  11/23/18  Palliative Care Type  New Palliative care  Reason for referral  Clarify Goals of Care  Date of Admission  11/21/18  Date first seen by Palliative Care  11/25/18  # of days Palliative referral response time  2 Day(s)  # of days IP prior to Palliative referral  2  Clinical Assessment  Palliative Performance Scale Score  40%  Psychosocial & Spiritual Assessment  Palliative Care Outcomes  Patient/Family meeting held?  No  Palliative Care Outcomes  Linked to palliative care logitudinal support      Patient Active Problem List   Diagnosis Date Noted  . Dementia with behavioral disturbance (HCC)   . Goals of care, counseling/discussion   . Palliative care by specialist   . Hypotension 11/21/2018  . Weakness 10/27/2018  . Hypokalemia 10/27/2018  . UTI (urinary tract infection) 10/27/2018  . Hyponatremia 10/27/2018  .  Seizures (Portal) 04/25/2017  . Syncope 04/25/2017  . Altered mental status 04/25/2017  . Arthritis   . Seasonal and perennial allergic rhinitis 05/09/2010  . Hyperlipemia 06/11/2007  . Hypertension 06/11/2007  . HEMORRHOIDS 06/11/2007  . COPD, MILD 06/11/2007  . OSTEOARTHRITIS 06/11/2007  . Personal history of colonic polyps 01/15/2007  . Asthma, mild intermittent, well-controlled 01/15/2007  . GERD 01/15/2007  . GASTRITIS, CHRONIC 04/22/2001    Palliative Care Assessment & Plan   HPI: 85 y.o. female  with past medical history of dementia and HTN admitted on 11/21/2018 with hypotension. UA concerning for UTI and CXR with some infiltrates. Patient thought to be dehydrated. Patient with behavior disturbances r/t dementia. Also now with  bursts of SVT - starting on amiodarone drip - being converted to PO amiodarone as of 9/30. Patient now under emergency APS custody d/t spouse not agreeing to transfer to ED, concern of unsafe living environment. PMT consulted for [place].   Assessment: 1. Continue to recommend  establishing DNR DNI. The patient is frail, elderly with dementia and comorbidities and will not benefit in any way with a resuscitative attempt, in my opinion. This was discussed extensively in a family meeting with son and husband on 11-28-2018. Awaiting paperwork from APS attending and non attending physician attestation of the patient's condition and for DNR. APS communicated with TRH MD this am.   2. Recommend home with hospice, patient will likely not thrive in unfamiliar surroundings of SNF, will not be able to participate in a rehab attempt. She has a high risk of developing new complications, including but not limited to falls, delirium, infections. In family meeting on 11-28-2018, son and husband had wished to seek more information from care management regarding arranging for private duty sitters, patient's long term care insurance etc.   Discussed with Dr. Broadus John.        Code Status:  Full code - have requested change to DNR per spouse/patient's wishes - medically appropriate  Prognosis:   guarded, could be less than 6 months. Patient at risk for dehydration, infection, which could shorten her prognosis significantly, at which point, she will benefit from residential hospice.   Discharge Planning:  Recommend home with hospice. See above.   Care plan was discussed with    Dr. Broadus John   Thank you for allowing the Palliative Medicine Team to assist in the care of this patient.   Total Time 25 minutes Prolonged Time Billed  no       Greater than 50%  of this time was spent counseling and coordinating care related to the above assessment and plan.   Loistine Chance MD Palliative Medicine Team Team Phone #  [phone]

## 2018-12-01 NOTE — TOC Progression Note (Signed)
Transition of Care [facility]) - Progression Note    Patient Details  Name: Lindsey Norris MRN: [id number] Date of Birth: 08-Nov-1926  Transition of Care [facility]) CM/SW Contact  Purcell Mouton, RN Phone Number: 12/01/2018, 10:59 AM  Clinical Narrative:   Spoke with guardian Ms Ouida Sills with SS# [phone] cell number. Also had called Samule Ohm.  Expected Discharge Plan: [place] Barriers to Discharge: Continued Medical Work up  Expected Discharge Plan and Services Expected Discharge Plan: [place] In-house Referral: Clinical Social Work Discharge Planning Services: CM Consult Post Acute Care Choice: [place] Living arrangements for the past 2 months: [place] Expected Discharge Date: 11/24/18                           [facility] Agency: [facility] Date [place]: 11/21/18 Time [place]: 1803 Representative spoke with at [place]: [place] (SDOH) Interventions    Readmission Risk Interventions No flowsheet data found.

## 2018-12-01 NOTE — Progress Notes (Signed)
Nutrition Follow-up  DOCUMENTATION CODES:   Underweight  INTERVENTION:  - continue Boost Breeze BID and Ensure BID. - continue to encourage PO intakes and floor staff to assist with meals.  * weigh patient today.   NUTRITION DIAGNOSIS:   Increased nutrient needs related to acute illness as evidenced by estimated needs. -ongoing  GOAL:   Patient will meet greater than or equal to 90% of their needs - minimally met on average  MONITOR:   PO intake, Supplement acceptance, Labs, Weight trends  ASSESSMENT:   85 y.o. female with history of HTN, hearing loss, and possible dementia. She was recently admitted for UTI three weeks ago. She presented to the ED on 9/26 after being found to be hypotensive by Home Health aide. She was found to be hypotensive in the ED. UA was concerning for UTI and CXR showed some infiltrates.  Patient has not been weighed since admission (9/27). Per flow sheet, she is a/o to self only. She has been eating 10-50% of meals since admission. Patient is very [place]. Nurse was at bedside at the time of RD visit. Patient was upset about not being able to go home and refused to talk with RD. Nurse reports that patient was very hungry this AM and did very well with her breakfast, no chewing or swallowing issues. Nurse did have to feed patient.   Per notes: - FTT/severe malnutrition - plan for comfort-focused care at home after d/c--remains full code at this time - hypotension--improved - hyponatremia--improved - UTI - PNA - moderate dementia with behavioral disturbances - AKI--improved   Labs reviewed; Ca: 8.5 mg/dl. Medications reviewed; daily multivitamin with minerals, 40 mg oral protonix/day.     NUTRITION - FOCUSED PHYSICAL EXAM:  unable to complete at this time.   Diet Order:   Diet Order            Diet Heart Room service appropriate? No; Fluid consistency: Thin  Diet effective now              EDUCATION NEEDS:   No education needs have  been identified at this time  Skin:  Skin Assessment: Reviewed RN Assessment  Last BM:  10/4  Height:   Ht Readings from Last 1 Encounters:  11/21/18 5' 3"  (1.6 m)    Weight:   Wt Readings from Last 1 Encounters:  11/23/18 40 kg    Ideal Body Weight:  52.3 kg  BMI:  Body mass index is 15.62 kg/m.  Estimated Nutritional Needs:   Kcal:  1400-1600 kcal  Protein:  65-75 grams  Fluid:  >/= 1.6 L/day      Jarome Matin, MS, RD, LDN, [facility] Inpatient Clinical Dietitian Pager # [phone] After hours/weekend pager # [phone]

## 2018-12-02 NOTE — Progress Notes (Signed)
PROGRESS NOTE    Lindsey Norris  ZOX:[id number] DOB: 1926-08-23 DOA: 11/21/2018 PCP: Gaspar Garbe, MD   Brief Narrative: Lindsey Norris is a 85 y.o. female with history of hypertension, hearing loss, dementia recently admitted for UTI , 3 weeks ago was brought to the ER after patient was found to be hypotensive.   patient's care has been taken over by state as patient's husband was refusing to transfer her to the ER.  Again 9/25 patient was visited by the home health aide again found to be hypotensive at this time, APS was called, state took custody of patient's care and she was transferred to the ER ED Course: In the ER patient was hypotensive with a blood pressure in the 80s,  Labs show increased creatinine from baseline of 1.48, 3 weeks ago weights around 1.1 with sodium of 130 UA concerning for UTI and chest x-ray showing some infiltrates.   COVID-19 test was negative.  -Hypotension has improved, hospitalization complicated by intermittent bouts of SVT and AFib RVR that has not been sustaining so Cardiology was consulted for further evaluation and Recommendations. Palliative consulted for further GOC Discussion and assistance with APS situation.  -Initially plan was for possible discharge home with Hospice and caregiver support, son is not fully on board with hospice philosophy but at this time plan is for home with hospice, additional 24/7 caregiver support,  patient son wishes the patient to be a full code at this time, hospice team can continue to work with family on these discussions post discharge  Assessment & Plan:   Ethics: -This is a 85 year old female with dementia, failure to thrive, severe malnutrition who was brought to the emergency room under APS direction, emergency guardianship was obtained by APS just prior to this hospitalization -Palliative team following, originally the goal of care was comfort focused, patient keeps requesting to go home to her spouse -Given  patient's cognitive decline, I am afraid she is at risk of more complications should she go to SNF, require excessive sedation, COVID exposure, and multitude of other potential complications as a result. -Our recommendations would for consideration of DNR and ideally home with hospice services, this was recommended to pts son as well -Palliative MD Dr. Linna Darner and myself had an extensive discussion patient's son and spouse 10/2, we are hopeful that patient's son will be able to obtain guardianship, he was considering home with hospice with additional caregiver support, since then pts son sent an email to patient's staff RN requesting full code (this is in shadow chart) -Since then patient's son has already discussed with case management and hospice of [place], home hospice has been arranged, currently awaiting set up of additional caregiver support for 24/7 supervision, support, will need ongoing conversations with hospice of [place].  Recommendations discussed with EPS as well, they are okay with patient going home with family as long as supervision, caregiver support can be arranged  Hypotension  -Likely hypovolemia related, also noted to have UTI,?  Pneumonia cannot be ruled out she did have some streaky infiltrates on initial x-ray clinically improved with antibiotics and fluids -Blood cultures are negative urine culture grew Enterococcus faecalis, she was originally treated with IV ceftriaxone subsequently transitioned to Augmentin completed 7 days of antibiotics, now discontinued -BP remained stable  Hyponatremia, improved -Improved with hydration, HCTZ discontinued   Enterococcus UTI versus colonization  -Recently had a UTI 3 weeks ago, now UA was abnormal on this admission, -Urine culture following reintubation now with Enterococcus faecalis,  antibiotics completed as above  Left Basilar Pneumonia, poA -CXR showed- Mild left basilar atelectasis or infiltrate  --Rx with IV Ceftriaxone  and Azithromycin changed to Augmentin -See discussion above, improved and stable from the standpoint, antibiotics completed  Moderate Dementia with Behavior Disturbances -Started on Sertraline 25 mg po Daily per Neurology for Mood Stabilization -Palliative team following, plan for home with hospice of [facility] and 24/7 caregiver support  Intermittent SVT and A. fib RVR -Appreciate cardiology input, was on IV amiodarone, now changed to PO -Felt to be a poor candidate for anticoagulation  Severe protein calorie malnutrition -Supplements as tolerated  AKI, improved Elevated anion gap -Resolved with hydration, HCTZ discontinued  History of Hypertension  -HCTZ discontinued, BP stable  History of Depression -C/w Sertraline 25 mg po Daily    Normocytic Anemia -Stable, hemoglobin is stable   Hypokalemia -Repleted   DVT prophylaxis: Enoxaparin 30 mg sq q24h Code Status: FULL CODE  Family Communication: No family at bedside, called and left message for son 10/3 and 10/4 Disposition Plan: Home with hospice, pending arrangement of 24/7 support per family  Consultants:   Palliative Care  Cardiology  Procedures:  ECHOCARDIOGRAM   Antimicrobials:  Anti-infectives (From admission, onward)   Start     Dose/Rate Route Frequency Ordered Stop   11/24/18 1700  amoxicillin-clavulanate (AUGMENTIN) 500-125 MG per tablet 500 mg     1 tablet Oral 2 times daily with meals 11/24/18 1419 11/27/18 0816   11/23/18 1800  azithromycin (ZITHROMAX) tablet 500 mg     500 mg Oral Every 24 hours 11/23/18 1616 11/25/18 1740   11/22/18 1800  cefTRIAXone (ROCEPHIN) 2 g in sodium chloride 0.9 % 100 mL IVPB  Status:  Discontinued     2 g 200 mL/hr over 30 Minutes Intravenous Every 24 hours 11/22/18 0143 11/24/18 1419   11/22/18 1800  azithromycin (ZITHROMAX) 500 mg in sodium chloride 0.9 % 250 mL IVPB  Status:  Discontinued     500 mg 250 mL/hr over 60 Minutes Intravenous Every 24 hours 11/22/18 0143  11/23/18 1614   11/21/18 1930  cefTRIAXone (ROCEPHIN) 1 g in sodium chloride 0.9 % 100 mL IVPB     1 g 200 mL/hr over 30 Minutes Intravenous  Once 11/21/18 1917 11/21/18 2106   11/21/18 1930  azithromycin (ZITHROMAX) 500 mg in sodium chloride 0.9 % 250 mL IVPB     500 mg 250 mL/hr over 60 Minutes Intravenous  Once 11/21/18 1917 11/22/18 0005     Subjective: -Remains pleasantly confused, hollering out to staff, she constantly requires somebody to be in the room with her, as long as staff members leave her room she starts screaming and hollering out, keeps requesting to be sent home  Vitals:   12/01/18 1205 12/01/18 1513 12/01/18 2033 12/02/18 0626  BP:  114/71 106/60 126/61  Pulse:  90 80 77  Resp:  14 16 16   Temp:  98 F (36.7 C) 98.8 F (37.1 C) 98.2 F (36.8 C)  TempSrc:  Oral Oral Oral  SpO2:  97% 96% 95%  Weight: 38.1 kg     Height:        Intake/Output Summary (Last 24 hours) at 12/02/2018 1312 Last data filed at 12/02/2018 1100 Gross per 24 hour  Intake 144 ml  Output -  Net 144 ml   Filed Weights   11/21/18 1722 11/23/18 0443 12/01/18 1205  Weight: 43.4 kg 40 kg 38.1 kg   Examination:  Physical Exam:  Gen: Elderly frail  chronically ill female extremely hard of hearing, awake alert oriented to self only, pleasantly confused, talks about going home to her dad HEENT: PERRLA, Neck supple, no JVD Lungs: Clear anteriorly CVS: RRR,No Gallops,Rubs or new Murmurs Abd: soft, Non tender, non distended, BS present Extremities: No edema Skin: no new rashes Psych, poor insight and judgment   Data Reviewed: I have personally reviewed following labs and imaging studies  CBC: Recent Labs  Lab 11/27/18 0424  WBC 7.7  HGB 11.1*  HCT 35.5*  MCV 93.9  PLT 301   Basic Metabolic Panel: Recent Labs  Lab 11/27/18 0424 11/28/18 0426  NA 136  --   K 4.1  --   CL 106  --   CO2 24  --   GLUCOSE 93  --   BUN 10  --   CREATININE 0.61 0.61  CALCIUM 8.5*  --    GFR:  Estimated Creatinine Clearance: 28.1 mL/min (by C-G formula based on SCr of 0.61 mg/dL). Liver Function Tests: No results for input(s): AST, ALT, ALKPHOS, BILITOT, PROT, ALBUMIN in the last 168 hours. No results for input(s): LIPASE, AMYLASE in the last 168 hours. No results for input(s): AMMONIA in the last 168 hours. Coagulation Profile: No results for input(s): INR, PROTIME in the last 168 hours. Cardiac Enzymes: No results for input(s): CKTOTAL, CKMB, CKMBINDEX, TROPONINI in the last 168 hours. BNP (last 3 results) No results for input(s): PROBNP in the last 8760 hours. HbA1C: No results for input(s): HGBA1C in the last 72 hours. CBG: No results for input(s): GLUCAP in the last 168 hours. Lipid Profile: No results for input(s): CHOL, HDL, LDLCALC, TRIG, CHOLHDL, LDLDIRECT in the last 72 hours. Thyroid Function Tests: No results for input(s): TSH, T4TOTAL, FREET4, T3FREE, THYROIDAB in the last 72 hours. Anemia Panel: No results for input(s): VITAMINB12, FOLATE, FERRITIN, TIBC, IRON, RETICCTPCT in the last 72 hours. Sepsis Labs: No results for input(s): PROCALCITON, LATICACIDVEN in the last 168 hours.  Recent Results (from the past 240 hour(s))  Culture, blood (routine x 2)     Status: None   Collection Time: 11/22/18  5:18 PM   Specimen: BLOOD  Result Value Ref Range Status   Specimen Description   Final    BLOOD LEFT ANTECUBITAL Performed at [facility] [address], [place], [place] [address]    Special Requests   Final    BOTTLES DRAWN AEROBIC ONLY Blood Culture adequate volume Performed at [facility] [address] [place], [place] [address]    Culture   Final    NO GROWTH 5 DAYS Performed at [facility], [place] [address], [place], [place] [address]    Report Status 11/27/2018 FINAL  Final  Culture, blood (routine x 2)     Status: None   Collection Time: 11/22/18  5:23 PM   Specimen: BLOOD LEFT FOREARM  Result  Value Ref Range Status   Specimen Description   Final    BLOOD LEFT FOREARM Performed at [facility], [place] [address], [place], [place] [address]    Special Requests   Final    BOTTLES DRAWN AEROBIC ONLY Blood Culture adequate volume Performed at [facility] [address], [place], [place] [address]    Culture   Final    NO GROWTH 5 DAYS Performed at [facility], [place] [address], [place],  [address]    Report Status 11/27/2018 FINAL  Final  Novel Coronavirus, NAA (  hospital order; send-out to ref lab)     Status: None   Collection Time: 11/27/18  7:13 AM   Specimen: Nasopharyngeal Swab; Respiratory  Result Value Ref Range Status   SARS-CoV-2, NAA NOT DETECTED NOT DETECTED Final    Comment: (NOTE) This nucleic acid amplification test was developed and its performance characteristics determined by [facility]. Nucleic acid amplification tests include PCR and TMA. This test has not been FDA cleared or approved. This test has been authorized by FDA under an Emergency Use Authorization (EUA). This test is only authorized for the duration of time the declaration that circumstances exist justifying the authorization of the emergency use of in vitro diagnostic tests for detection of SARS-CoV-2 virus and/or diagnosis of COVID-19 infection under section 564(b)(1) of the Act, 21 U.S.C. 161WRU-0(A360bbb-3(b) (1), unless the authorization is terminated or revoked sooner. When diagnostic testing is negative, the possibility of a false negative result should be considered in the context of a patient's recent exposures and the presence of clinical signs and symptoms consistent with COVID-19. An individual without symptoms of COVID- 19 and who is not shedding SARS-CoV-2 vi rus would expect to have a negative (not detected) result in this assay. Performed At: [facility] [place] [address] [place], [place] [id number] Jolene SchimkeNagendra Sanjai MD YN:[id number]:[phone]     Coronavirus Source NASOPHARYNGEAL  Final    Comment: Performed at [facility], [address] [place], [place] [address]    Radiology Studies: No results found. Scheduled Meds: . amiodarone  200 mg Oral BID  . [START ON 12/10/2018] amiodarone  200 mg Oral Daily  . enoxaparin (LOVENOX) injection  30 mg Subcutaneous Q24H  . feeding supplement  1 Container Oral BID BM  . feeding supplement (ENSURE ENLIVE)  237 mL Oral BID BM  . multivitamin with minerals  1 tablet Oral Daily  . OLANZapine  2.5 mg Oral Daily  . pantoprazole  40 mg Oral Daily  . sertraline  25 mg Oral Daily   Continuous Infusions:   LOS: 10 days   Lindsey CovePreetha Samone Guhl, MD [facility]  12/02/2018, 1:12 PM

## 2018-12-02 NOTE — Progress Notes (Signed)
Daily Progress Note   Patient Name: Lindsey Norris       Date: 12/02/2018 DOB: [birth year]  Age: 85 y.o. MRN#: [id number] Attending Physician: Zannie Cove, MD Primary Care Physician: Gaspar Garbe, MD Admit Date: 11/21/2018  Reason for Consultation/Follow-up: Establishing goals of care  Subjective: Patient is awake but disoriented x4.  Calling out for help at times.  She is not able to participate in conversations regarding goals and is currently under APS emergency guardianship.  Son reported to have hearing scheduled for the end of the week.  Discussed case at length with Dr. Jomarie Longs as well as case management.  Working to arrange discharge with coordination of APS, patient son, and possibly home hospice.  Concern remains having adequate supervision and help for [name] 24 hours per day.     Length of Stay: 10  Current Medications: Scheduled Meds:  . amiodarone  200 mg Oral BID  . [START ON 12/10/2018] amiodarone  200 mg Oral Daily  . enoxaparin (LOVENOX) injection  30 mg Subcutaneous Q24H  . feeding supplement  1 Container Oral BID BM  . feeding supplement (ENSURE ENLIVE)  237 mL Oral BID BM  . multivitamin with minerals  1 tablet Oral Daily  . OLANZapine  2.5 mg Oral Daily  . pantoprazole  40 mg Oral Daily  . sertraline  25 mg Oral Daily    Continuous Infusions:   PRN Meds: acetaminophen **OR** acetaminophen, ALPRAZolam, ipratropium, levalbuterol, ondansetron **OR** ondansetron (ZOFRAN) IV     Vital Signs: BP 126/61 (BP Location: Right Arm)   Pulse 77   Temp 98.2 F (36.8 C) (Oral)   Resp 16   Ht 5\' 3"  (1.6 m)   Wt 38.1 kg   SpO2 95%   BMI 14.88 kg/m  SpO2: SpO2: 95 % O2 Device: O2 Device: Room Air O2 Flow Rate: O2 Flow Rate (L/min): 0 L/min  Intake/output  summary:   Intake/Output Summary (Last 24 hours) at 12/02/2018 1146 Last data filed at 12/02/2018 1100 Gross per 24 hour  Intake 144 ml  Output -  Net 144 ml   LBM: Last BM Date: 12/01/18 Baseline Weight: Weight: 43.4 kg Most recent weight: Weight: 38.1 kg       Palliative Assessment/Data: PPS 40% Awake alert Disoriented She is frail Regular breath sounds S1 S2 Abdomen is not tender No edema    Flowsheet Rows     Most Recent Value  Intake Tab  Referral Department  Hospitalist  Unit at Time of Referral  Cardiac/Telemetry Unit  Palliative Care Primary Diagnosis  Neurology  Date Notified  11/23/18  Palliative Care Type  New Palliative care  Reason for referral  Clarify Goals of Care  Date of Admission  11/21/18  Date first seen by Palliative Care  11/25/18  # of days Palliative referral response time  2 Day(s)  # of days IP prior to Palliative referral  2  Clinical Assessment  Palliative Performance Scale Score  40%  Psychosocial & Spiritual Assessment  Palliative Care Outcomes  Patient/Family meeting held?  No  Palliative Care Outcomes  Linked to palliative care logitudinal support      Patient Active Problem List   Diagnosis Date  Noted  . Dementia with behavioral disturbance ([place])   . Goals of care, counseling/discussion   . Palliative care by specialist   . Hypotension 11/21/2018  . Weakness 10/27/2018  . Hypokalemia 10/27/2018  . UTI (urinary tract infection) 10/27/2018  . Hyponatremia 10/27/2018  . Seizures ([place]) 04/25/2017  . Syncope 04/25/2017  . Altered mental status 04/25/2017  . Arthritis   . Seasonal and perennial allergic rhinitis 05/09/2010  . Hyperlipemia 06/11/2007  . Hypertension 06/11/2007  . HEMORRHOIDS 06/11/2007  . COPD, MILD 06/11/2007  . OSTEOARTHRITIS 06/11/2007  . Personal history of colonic polyps 01/15/2007  . Asthma, mild intermittent, well-controlled 01/15/2007  . GERD 01/15/2007  . GASTRITIS, CHRONIC 04/22/2001     Palliative Care Assessment & Plan   HPI: 85 y.o. female  with past medical history of dementia and HTN admitted on 11/21/2018 with hypotension. UA concerning for UTI and CXR with some infiltrates. Patient thought to be dehydrated. Patient with behavior disturbances r/t dementia. Also now with bursts of SVT - starting on amiodarone drip - being converted to PO amiodarone as of 9/30. Patient now under emergency APS custody d/t spouse not agreeing to transfer to ED, concern of unsafe living environment. PMT consulted for [place].   Assessment: 1. Recommend continuing work to establish DNR DNI.  [name] has multiple comorbidities including dementia and very frail health.  She would not benefit from resuscitative attempt in the event of cardiac or respiratory arrest.  Paperwork with this recommendation has been sent to APS for review.   2.  Ms. [name] would benefit for plan to transition home with hospice if it can be arranged that she have 24-hour in-home supervision/care.  I agree with prior assessment that she will likely not thrive in unfamiliar surroundings of SNF but there is continued concern in my discussion with care management regarding the ability to arrange for private duty sitters (not able to pay out-of-pocket, question if patient has long term care insurance etc).  ?  If need to revisit possibility of SNF if adequate help cannot be arranged for home.  Discussed with Dr. Broadus John and case management.      Code Status:  Full code -I support that change to DNR is medically appropriate  Prognosis:   guarded, likely less than 6 months. Patient at risk for dehydration, infection, which could shorten her prognosis significantly, at which point, she will benefit from residential hospice.   Discharge Planning:  Recommend home with hospice if it can be arranged.  If not, may need to consider SNF.  Currently awaiting input from patient son and APS caseworker.   Care plan was discussed with    Dr.  Broadus John   Thank you for allowing the Palliative Medicine Team to assist in the care of this patient.   Total Time 40 minutes Prolonged Time Billed  no       Greater than 50%  of this time was spent counseling and coordinating care related to the above assessment and plan.  Micheline Rough, MD [place] Team [phone]

## 2018-12-02 NOTE — TOC Progression Note (Signed)
Transition of Care [facility]) - Progression Note    Patient Details  Name: CIJI BOSTON MRN: [id number] Date of Birth: 11-29-1926  Transition [facility]) CM/SW Contact  Purcell Mouton, RN Phone Number: 12/02/2018, 4:33 PM  Clinical Narrative:    Spoke with Alveda Reasons, RN [phone] concerning pt going home with 24/7 services. Information shared with her [place] [facility]'s number. Hopefully pt may be able to discharge in AM. Will continue to follow.    Expected Discharge Plan: [place] Barriers to Discharge: Continued Medical Work up  Expected Discharge Plan and Services Expected Discharge Plan: [place] In-house Referral: Clinical Social Work Discharge Planning Services: CM Consult Post Acute Care Choice: [place] Living arrangements for the past 2 months: [place] Expected Discharge Date: 11/24/18                           [facility] Agency: [facility] Date [place]: 11/21/18 Time [place]: 1803 Representative spoke with at [place]: [place] (SDOH) Interventions    Readmission Risk Interventions No flowsheet data found.

## 2018-12-02 NOTE — TOC Progression Note (Signed)
Transition of Care [facility]) - Progression Note    Patient Details  Name: SHENIKA QUINT MRN: [id number] Date of Birth: May 05, 1926  Transition of Care [facility]) CM/SW Contact  Purcell Mouton, RN Phone Number: 12/02/2018, 12:17 PM  Clinical Narrative:    Spoke with pt's son again this morning concerning discharge plans. Pt will discharge home with Hospice and 24/7 care/son will call today to put in place. Also pt's father is looking for Long term Set designer. Son Gwenlyn Fudge refused SNF at this present time.    Expected Discharge Plan: [place] Barriers to Discharge: Continued Medical Work up  Expected Discharge Plan and Services Expected Discharge Plan: [place] In-house Referral: Clinical Social Work Discharge Planning Services: CM Consult Post Acute Care Choice: [place] Living arrangements for the past 2 months: [place] Expected Discharge Date: 11/24/18                           [facility] Agency: [facility] Date [place]: 11/21/18 Time [place]: 1803 Representative spoke with at [place]: [place] (SDOH) Interventions    Readmission Risk Interventions No flowsheet data found.

## 2018-12-03 MED ORDER — OLANZAPINE 2.5 MG PO TABS: 2.5000 mg | ORAL_TABLET | Freq: Every day | ORAL | 0 refills | Status: AC

## 2018-12-03 MED ORDER — ENSURE ENLIVE PO LIQD: 237.0000 mL | Freq: Two times a day (BID) | ORAL | 12 refills | Status: AC

## 2018-12-03 MED ORDER — ONDANSETRON HCL 4 MG PO TABS: 4.0000 mg | ORAL_TABLET | Freq: Four times a day (QID) | ORAL | 0 refills | Status: AC | PRN

## 2018-12-03 MED ORDER — AMIODARONE HCL 200 MG PO TABS: ORAL_TABLET | ORAL | 0 refills | Status: AC

## 2018-12-03 MED ORDER — ALPRAZOLAM 0.25 MG PO TABS: 0.2500 mg | ORAL_TABLET | Freq: Two times a day (BID) | ORAL | 0 refills | Status: AC | PRN

## 2018-12-03 MED ORDER — TRAMADOL HCL 50 MG PO TABS: 50.0000 mg | ORAL_TABLET | Freq: Four times a day (QID) | ORAL | 0 refills | Status: AC | PRN

## 2018-12-03 NOTE — TOC Progression Note (Signed)
Transition of Care [facility]) - Progression Note    Patient Details  Name: Lindsey Norris MRN: [id number] Date of Birth: 1927-01-14  Transition of Care [facility]) CM/SW Contact  Purcell Mouton, RN Phone Number: 12/03/2018, 3:41 PM  Clinical Narrative:    Spoke with [place] guardianship planner concerning discharge plan. Ms Ouida Sills states that it was approved for pt to return home with [facility] and [place] tomorrow am. Spoke with Lattie Haw, RN with [facility] concerning sitters, pt will need to be home at 12 noon for sitters to start. Spoke with Bevely Palmer, RN with [facility]/hospice, RN will be at the home at 2 PM.  RN and MD are aware of plan for discharge in AM. A call was made to pt's son VM left, waiting on return call.   Expected Discharge Plan: [place] Barriers to Discharge: Continued Medical Work up  Expected Discharge Plan and Services Expected Discharge Plan: [place] In-house Referral: Clinical Social Work Discharge Planning Services: CM Consult Post Acute Care Choice: [place] Living arrangements for the past 2 months: [place] Expected Discharge Date: 11/24/18                           [facility] Agency: [facility] Date [place]: 11/21/18 Time [place]: 1803 Representative spoke with at [place]: [place] (SDOH) Interventions    Readmission Risk Interventions No flowsheet data found.

## 2018-12-03 NOTE — Progress Notes (Signed)
Occupational Therapy Treatment Patient Details Name: Lindsey Norris MRN: [id number] DOB: Apr 11, 1926 Today's Date: 12/03/2018    History of present illness Lindsey Norris is a 85 y.o. female with history of hypertension, hearing loss possible dementia recently admitted for UTI 3 weeks ago was brought to the ER after patient was found to be hypotensive.   OT comments  Worked on grooming while sitting EOB, and sit to stand, 2 trials. Lunch came and pt wanted to eat.  Full set up, then pt fed herself.  Follow Up Recommendations  Supervision/Assistance - 24 hour;SNF(possible home hospice per chart)    Equipment Recommendations  None recommended by OT    Recommendations for Other Services      Precautions / Restrictions Precautions Precautions: Fall Precaution Comments: Pt HOH, uses white board to communicate. Pt is very fearful of being left alone (per last admission) Restrictions Weight Bearing Restrictions: No       Mobility Bed Mobility         Supine to sit: Total assist;+2 for physical assistance Sit to supine: Max assist;+2 for safety/equipment      Transfers   Equipment used: 2 person hand held assist   Sit to Stand: Max assist;+2 physical assistance         General transfer comment: x 2 trials    Balance       Sitting balance - Comments: min guard for safety                                   ADL either performed or assessed with clinical judgement   ADL   Eating/Feeding: Set up;Bed level   Grooming: Brushing hair;Minimal assistance;Sitting                                 General ADL Comments: grooming from EOB; worked on sit to stand.       Vision       Perception     Praxis      Cognition Arousal/Alertness: Awake/alert Behavior During Therapy: Anxious Overall Cognitive Status: No family/caregiver present to determine baseline cognitive functioning                                  General Comments: h/o dementia.  Very HOH; repeatedly making same comments (today perseverating on being held in hospital against her will, asking to go to charge nurse office, wants to leave)        Exercises     Shoulder Instructions       General Comments      Pertinent Vitals/ Pain       Pain Assessment: Faces Faces Pain Scale: No hurt  Home Living                                          Prior Functioning/Environment              Frequency  Min 2X/week        Progress Toward Goals  OT Goals(current goals can now be found in the care plan section)  Progress towards OT goals: Progressing toward goals(slowly)     Plan      Co-evaluation  PT/OT/SLP Co-Evaluation/Treatment: Yes Reason for Co-Treatment: Complexity of the patient's impairments (multi-system involvement) PT goals addressed during session: Mobility/safety with mobility OT goals addressed during session: ADL's and self-care      AM-PAC OT "6 Clicks" Daily Activity     Outcome Measure   Help from another person eating meals?: A Little Help from another person taking care of personal grooming?: A Little Help from another person toileting, which includes using toliet, bedpan, or urinal?: Total Help from another person bathing (including washing, rinsing, drying)?: A Lot Help from another person to put on and taking off regular upper body clothing?: A Lot Help from another person to put on and taking off regular lower body clothing?: Total 6 Click Score: 12    End of Session    OT Visit Diagnosis: Unsteadiness on feet (R26.81);Muscle weakness (generalized) (M62.81);Other abnormalities of gait and mobility (R26.89);Other symptoms and signs involving cognitive function   Activity Tolerance Patient tolerated treatment well   Patient Left in bed;with call bell/phone within reach;with bed alarm set   Nurse Communication          Time: 3329-5188 OT Time Calculation  (min): 21 min  Charges: OT General Charges $OT Visit: 1 Visit(no charge; co tx)  Marica Otter, OTR/L Acute Rehabilitation Services [phone] [facility] pager [phone] office 12/03/2018   Deyna Carbon 12/03/2018, 12:21 PM

## 2018-12-03 NOTE — Discharge Summary (Addendum)
Physician Discharge Summary  Lindsey Norris ZOX:[id number] DOB: 13-Mar-1926 DOA: 11/21/2018  PCP: Gaspar Garbe, MD  Admit date: 11/21/2018 Discharge date: 12/03/2018  Admitted From: Home Disposition: Home with Hospice Services  Recommendations for Outpatient Follow-up:  1. Follow up with PCP in 1-2 weeks 2. Follow up care per Hospice Protocol and with [facility] sitters   Home Health: Yes  Equipment/Devices: None    Discharge Condition: Guarded  CODE STATUS: FULL CODE Diet recommendation: Heart Healthy Diet   Brief/Interim Summary: Lindsey Norris a 85 y.o.femalewithhistory of hypertension, hearing loss, dementia recently admitted for UTI , 3 weeks ago was brought to the ER after patient was found to be hypotensive.  patient's care has been taken over by state as patient's husband was refusing to transfer her to the ER. Again 9/25 patient was visited by the home health aide again found to be hypotensive at this time, APS was called, state took custody of patient's care and she was transferred to the ER ED Course:In the ER patient was hypotensive with a blood pressure in the 80s, Labs show increased creatinine from baseline of 1.48, 3 weeks ago weights around 1.1 with sodium of 130 UA concerning for UTI and chest x-ray showing some infiltrates. COVID-19 test was negative.   Hypotension has improved, hospitalization complicated by intermittent bouts of SVT and AFib RVR that has not been sustaining so Cardiology was consulted for further evaluation and Recommendations. Palliative consulted for further GOC Discussion and assistance with APS situation.   Initially plan was for possible discharge home with Hospice and caregiver support and son wasnot fully on board with hospice philosophy but at this time plan is for home with hospice, additional 24/7 caregiver support now that it is set up and son is agreeable. Patient's son wishes the patient to be a full code at  this time, hospice team can continue to work with family on these discussions post discharge. She was deemed stable to be D/C'd home with Hospice Services.   ADDENDUM 12/04/2018: Patient stable to be discharged today as unfortunately services could not be set up yesterday. No acute changes and patient will go home with Hospice and 24/7 Sitters from [place]. Case Management updated and stated she has everything ready for Discharge.   Discharge Diagnoses:  Principal Problem:   Hypotension Active Problems:   Hypertension   UTI (urinary tract infection)   Dementia with behavioral disturbance (HCC)   Goals of care, counseling/discussion   Palliative care by specialist  Ethics -This is a 85 year old female with dementia, failure to thrive, severe malnutrition who was brought to the emergency room under APS direction, emergency guardianship was obtained by APS just prior to this hospitalization -Palliative team following, originally the goal of care was comfort focused, patient keeps requesting to go home to her spouse -Given patient's cognitive decline, I am in agreement with Dr. Conan Bowens and am afraid she is at risk of more complications should she go to SNF, require excessive sedation, COVID exposure, and multitude of other potential complications as a result. -Our recommendations would for consideration of DNR and ideally home with hospice services, this was recommended to pts son as well -Palliative MD Dr. Linna Darner and Dr. Jomarie Longs had an extensive discussion patient's son and spouse 10/2, we are hopeful that patient's son will be able to obtain guardianship, he was considering home with hospice with additional caregiver support, since then pts son sent an email to patient's staff RN requesting full code (this  is in shadow chart) -Since then patient's son has already discussed with case management and [facility] of [place], home hospice has been arranged, currently awaiting set up of additional caregiver  support for 24/7 supervision, support, will need ongoing conversations with [facility] [place].  Recommendations discussed with APS as well, they are okay with patient going home with family as long as supervision, caregiver support can be arranged and this was done today per Case Management   Hypotension, improved  -Likely hypovolemia related, also noted to have UTI,?  Pneumonia cannot be ruled out she did have some streaky infiltrates on initial x-ray clinically improved with antibiotics and fluids -Blood cultures are negative urine culture grew Enterococcus faecalis, she was originally treated with IV ceftriaxone subsequently transitioned to Augmentin completed 7 days of antibiotics, now discontinued -BP remained stable and is on the lower side at 103/67  Hyponatremia, improved -Improved with hydration, HCTZ discontinued   Enterococcus UTI versus colonization  -Recently had a UTI 3 weeks ago, now UA was abnormal on this admission, -Urine culture following reintubation now with Enterococcus faecalis, antibiotics completed as above  Left Basilar Pneumonia, poA -CXR showed- Mild left basilar atelectasis or infiltrate  --Rx with IV Ceftriaxone and Azithromycin changed to Augmentin -See discussion above, improved and stable from the standpoint, antibiotics completed  Moderate Dementia with Behavior Disturbances -Started on Sertraline 25 mg po Daily per Neurology for Mood Stabilization; Also started on Olanzapine this visit -Palliative team following, plan for home with [facility] and 24/7 caregiver support  Intermittent SVT and A. fib RVR -Appreciate cardiology input, was on IV amiodarone, now changed to PO and will continue Taper as an outpatient  -Felt to be a poor candidate for anticoagulation  Severe protein calorie malnutrition -Supplements as tolerated  AKI, improved Elevated anion gap -Resolved with hydration, HCTZ discontinued and will stop at  discharge -Follow Labs as an outpatient   History of Hypertension  -HCTZ discontinued, BP stable  History of Depression -C/w Sertraline 25 mg po Daily    Normocytic Anemia -Stable, hemoglobin is stable and Hgb/Hct value was 11.1/35.5 -Continue to monitor for signs and symptoms of bleeding; currently no overt bleeding noted -Repeat CBC in outpatient setting  Hypokalemia -Repleted and last value checked was 4.1 -Follow-up CMP in outpatient setting  Discharge Instructions  Allergies as of 12/03/2018      Reactions   Sulfonamide Derivatives    REACTION: vomiting      Medication List    STOP taking these medications   triamterene-hydrochlorothiazide 37.5-25 MG capsule Commonly known as: DYAZIDE     TAKE these medications   acetaminophen 325 MG tablet Commonly known as: TYLENOL Take 2 tablets (650 mg total) by mouth every 6 (six) hours as needed for mild pain (or Fever >/= 101).   ALPRAZolam 0.25 MG tablet Commonly known as: XANAX Take 1 tablet (0.25 mg total) by mouth 2 (two) times daily as needed for anxiety.   amiodarone 200 MG tablet Commonly known as: PACERONE Take 1 tablet (200 mg total) by mouth 2 (two) times daily for 7 days, THEN 1 tablet (200 mg total) daily. Start taking on: December 03, 2018   EPINEPHrine 0.3 mg/0.3 mL Soaj injection Commonly known as: EpiPen 2-Pak Inject into thigh if needed for severe allergic reaction   esomeprazole 40 MG capsule Commonly known as: NEXIUM Take 40 mg by mouth 2 (two) times daily before a meal.   feeding supplement (ENSURE ENLIVE) Liqd Take 237 mLs by mouth 2 (two)  times daily between meals.   multivitamin capsule Take 1 capsule by mouth daily.   OLANZapine 2.5 MG tablet Commonly known as: ZYPREXA Take 1 tablet (2.5 mg total) by mouth daily. Start taking on: December 04, 2018   ondansetron 4 MG tablet Commonly known as: ZOFRAN Take 1 tablet (4 mg total) by mouth every 6 (six) hours as needed for nausea.    sertraline 25 MG tablet Commonly known as: ZOLOFT TAKE 1 TABLET BY MOUTH EVERY DAY   traMADol 50 MG tablet Commonly known as: ULTRAM Take 1 tablet (50 mg total) by mouth every 6 (six) hours as needed for moderate pain.      Contact information for after-discharge care    Destination    [facility] .   Service: Skilled Nursing Contact information: [place] [phone]             Allergies  Allergen Reactions  . Sulfonamide Derivatives     REACTION: vomiting   Consultations:  Palliative Care Medicine  Cardiology  Procedures/Studies: Dg Chest Port 1 View  Result Date: 11/25/2018 CLINICAL DATA:  Shortness of breath EXAM: PORTABLE CHEST 1 VIEW COMPARISON:  November 23, 2018 FINDINGS: Probable tiny right effusion. Left retrocardiac opacity is more prominent the interval. The cardiomediastinal silhouette is stable. No pneumothorax. The lungs are otherwise clear. IMPRESSION: 1. The left retrocardiac opacity is mildly more prominent in the interval. No other changes. Electronically Signed   By: Dorise Bullion III M.D   On: 11/25/2018 08:26   Dg Chest Port 1 View  Result Date: 11/23/2018 CLINICAL DATA:  Shortness of breath EXAM: PORTABLE CHEST 1 VIEW COMPARISON:  11/21/2018, 10/26/2018 FINDINGS: Suspected tiny pleural effusions. No change in patchy airspace disease at the left base. Stable enlarged cardiomediastinal silhouette with aortic atherosclerosis. No pneumothorax. IMPRESSION: 1. Suspected tiny pleural effusions. 2. No change in airspace disease at the left base, atelectasis versus pneumonia 3. Mild cardiomegaly Electronically Signed   By: Donavan Foil M.D.   On: 11/23/2018 15:54   Dg Chest Port 1 View  Result Date: 11/21/2018 CLINICAL DATA:  Weakness. EXAM: PORTABLE CHEST 1 VIEW COMPARISON:  Radiograph of October 26, 2018. FINDINGS: Stable cardiomediastinal silhouette. Atherosclerosis of thoracic aorta is noted. No  pneumothorax is noted. No pleural effusion is noted. Right lung is clear. Mild left basilar atelectasis or infiltrate is noted. Bony thorax is unremarkable. IMPRESSION: Stable mild left basilar atelectasis or infiltrate. Aortic Atherosclerosis (ICD10-I70.0). Electronically Signed   By: Marijo Conception M.D.   On: 11/21/2018 18:35    Subjective: Seen and examined at bedside and was screaming and upset and wanted to go home. Pleasantly demented and was using racial slurs against me. No family present at bedside. Case Management stated Alvis Lemmings to provide 24/7 HomeCare sitter. No other concerns or complaints at this time.   Discharge Exam: Vitals:   12/03/18 0332 12/03/18 1351  BP: (!) 115/92 103/67  Pulse: 89 83  Resp: 20 18  Temp: 98.1 F (36.7 C) (!) 97.5 F (36.4 C)  SpO2: 99% 99%   Vitals:   12/02/18 1709 12/02/18 2109 12/03/18 0332 12/03/18 1351  BP: 113/73 (!) 102/54 (!) 115/92 103/67  Pulse: 85 76 89 83  Resp: 19 18 20 18   Temp: 98.3 F (36.8 C) 98.7 F (37.1 C) 98.1 F (36.7 C) (!) 97.5 F (36.4 C)  TempSrc: Oral Axillary Axillary Oral  SpO2: 98% 99% 99% 99%  Weight:      Height:  General: Pt is alert, awake, not in acute distress Cardiovascular: RRR, S1/S2 +, no rubs, no gallops Respiratory: CTA bilaterally, no wheezing, no rhonchi Abdominal: Soft, NT, ND, bowel sounds + Extremities: no edema, no cyanosis  The results of significant diagnostics from this hospitalization (including imaging, microbiology, ancillary and laboratory) are listed below for reference.    Microbiology: Recent Results (from the past 240 hour(s))  Novel Coronavirus, NAA (hospital order; send-out to ref lab)     Status: None   Collection Time: 11/27/18  7:13 AM   Specimen: Nasopharyngeal Swab; Respiratory  Result Value Ref Range Status   SARS-CoV-2, NAA NOT DETECTED NOT DETECTED Final    Comment: (NOTE) This nucleic acid amplification test was developed and its performance characteristics  determined by [employer]. Nucleic acid amplification tests include PCR and TMA. This test has not been FDA cleared or approved. This test has been authorized by FDA under an Emergency Use Authorization (EUA). This test is only authorized for the duration of time the declaration that circumstances exist justifying the authorization of the emergency use of in vitro diagnostic tests for detection of SARS-CoV-2 virus and/or diagnosis of COVID-19 infection under section 564(b)(1) of the Act, 21 U.S.C. 914NWG-9(F) (1), unless the authorization is terminated or revoked sooner. When diagnostic testing is negative, the possibility of a false negative result should be considered in the context of a patient's recent exposures and the presence of clinical signs and symptoms consistent with COVID-19. An individual without symptoms of COVID- 19 and who is not shedding SARS-CoV-2 vi rus would expect to have a negative (not detected) result in this assay. Performed At: [facility] [address] [place], [place] [id number] Jolene Schimke MD QI:[id number]    Coronavirus Source NASOPHARYNGEAL  Final    Comment: Performed at [facility], [address], [place], [place] [address]    Labs: BNP (last 3 results) Recent Labs    10/26/18 2029  BNP 66.9   Basic Metabolic Panel: Recent Labs  Lab 11/27/18 0424 11/28/18 0426  NA 136  --   K 4.1  --   CL 106  --   CO2 24  --   GLUCOSE 93  --   BUN 10  --   CREATININE 0.61 0.61  CALCIUM 8.5*  --    Liver Function Tests: No results for input(s): AST, ALT, ALKPHOS, BILITOT, PROT, ALBUMIN in the last 168 hours. No results for input(s): LIPASE, AMYLASE in the last 168 hours. No results for input(s): AMMONIA in the last 168 hours. CBC: Recent Labs  Lab 11/27/18 0424  WBC 7.7  HGB 11.1*  HCT 35.5*  MCV 93.9  PLT 175   Cardiac Enzymes: No results for input(s): CKTOTAL, CKMB, CKMBINDEX, TROPONINI in the  last 168 hours. BNP: Invalid input(s): POCBNP CBG: No results for input(s): GLUCAP in the last 168 hours. D-Dimer No results for input(s): DDIMER in the last 72 hours. Hgb A1c No results for input(s): HGBA1C in the last 72 hours. Lipid Profile No results for input(s): CHOL, HDL, LDLCALC, TRIG, CHOLHDL, LDLDIRECT in the last 72 hours. Thyroid function studies No results for input(s): TSH, T4TOTAL, T3FREE, THYROIDAB in the last 72 hours.  Invalid input(s): FREET3 Anemia work up No results for input(s): VITAMINB12, FOLATE, FERRITIN, TIBC, IRON, RETICCTPCT in the last 72 hours. Urinalysis    Component Value Date/Time   COLORURINE YELLOW 11/21/2018 2028   APPEARANCEUR CLEAR 11/21/2018 2028   LABSPEC 1.010 11/21/2018 2028   PHURINE 7.0 11/21/2018  2028   GLUCOSEU NEGATIVE 11/21/2018 2028   HGBUR NEGATIVE 11/21/2018 2028   BILIRUBINUR NEGATIVE 11/21/2018 2028   KETONESUR NEGATIVE 11/21/2018 2028   PROTEINUR NEGATIVE 11/21/2018 2028   UROBILINOGEN 0.2 08/12/2012 1116   NITRITE NEGATIVE 11/21/2018 2028   LEUKOCYTESUR LARGE (A) 11/21/2018 2028   Sepsis Labs Invalid input(s): PROCALCITONIN,  WBC,  LACTICIDVEN Microbiology Recent Results (from the past 240 hour(s))  Novel Coronavirus, NAA (hospital order; send-out to ref lab)     Status: None   Collection Time: 11/27/18  7:13 AM   Specimen: Nasopharyngeal Swab; Respiratory  Result Value Ref Range Status   SARS-CoV-2, NAA NOT DETECTED NOT DETECTED Final    Comment: (NOTE) This nucleic acid amplification test was developed and its performance characteristics determined by [employer]. Nucleic acid amplification tests include PCR and TMA. This test has not been FDA cleared or approved. This test has been authorized by FDA under an Emergency Use Authorization (EUA). This test is only authorized for the duration of time the declaration that circumstances exist justifying the authorization of the emergency use of in vitro  diagnostic tests for detection of SARS-CoV-2 virus and/or diagnosis of COVID-19 infection under section 564(b)(1) of the Act, 21 U.S.C. 546EVO-3(J) (1), unless the authorization is terminated or revoked sooner. When diagnostic testing is negative, the possibility of a false negative result should be considered in the context of a patient's recent exposures and the presence of clinical signs and symptoms consistent with COVID-19. An individual without symptoms of COVID- 19 and who is not shedding SARS-CoV-2 vi rus would expect to have a negative (not detected) result in this assay. Performed At: [facility] [address] [place], [place] [id number] Jolene Schimke MD HB:[id number]    Coronavirus Source NASOPHARYNGEAL  Final    Comment: Performed at [facility], [address], [place], [place] [address]   Time coordinating discharge: 35 minutes  SIGNED:  Merlene Laughter, DO [facility] 12/03/2018, 3:27 PM Pager is on AMION  If 7PM-7AM, please contact night-coverage [email or web address] Password [email or web address]

## 2018-12-03 NOTE — Progress Notes (Signed)
Physical Therapy Treatment Patient Details Name: Lindsey Norris MRN: [id number] DOB: Dec 09, 1926 Today's Date: 12/03/2018    History of Present Illness Lindsey Norris is a 85 y.o. female with history of hypertension, hearing loss possible dementia recently admitted for UTI 3 weeks ago was brought to the ER after patient was found to be hypotensive.    PT Comments    Patient eager to mobilize when therapists arrived stating "I don't care what we do...anything is better than nothing". She continues to require max assist +2 for bed mobility and to perform sit<>stand transfers with HHA from EOB. Patient was able to maintain balance while sitting EOB with min guard while performing self care to brush hair. Once lunch arrived pt eager to eat and required total assist +2 to return to supine and reposition in bed to be able to eat meal. Pt also required assistance with meal set up however was able to feed herself. Acute PT will continue to follow and progress mobility as able.   Follow Up Recommendations  SNF     Equipment Recommendations  None recommended by PT    Recommendations for Other Services       Precautions / Restrictions Precautions Precautions: Fall Precaution Comments: Pt HOH, uses white board to communicate. Pt is very fearful of being left alone (per last admission) Restrictions Weight Bearing Restrictions: No    Mobility  Bed Mobility Overal bed mobility: Needs Assistance Bed Mobility: Supine to Sit;Sit to Supine     Supine to sit: Total assist;+2 for physical assistance Sit to supine: +2 for physical assistance;Total assist   General bed mobility comments: verbal cues to encourage pt to sequence reaching and LE mobility for bed mob; pt not initiating movement and required max assist +2 to sit EOB  Transfers   Equipment used: 2 person hand held assist Transfers: Sit to/from Stand Sit to Stand: Max assist;+2 physical assistance         General transfer  comment: sit<>stand from EOB x2 with 2 person HHA and max assist to support in standing. pt initiated some anterior weight shift and LE extension to stand but unable to extend hips and knees fully to achieve full extension.  Ambulation/Gait                 Stairs             Wheelchair Mobility    Modified Rankin (Stroke Patients Only)       Balance   Sitting-balance support: Feet supported;Single extremity supported Sitting balance-Leahy Scale: Poor Sitting balance - Comments: min guard for safety   Standing balance support: Bilateral upper extremity supported Standing balance-Leahy Scale: Poor Standing balance comment: requiring UE support and external assist           Cognition Arousal/Alertness: Awake/alert Behavior During Therapy: Anxious Overall Cognitive Status: No family/caregiver present to determine baseline cognitive functioning        General Comments: h/o dementia.  Very HOH; repeatedly making same comments (today perseverating on being held in hospital against her will, asking to go to charge nurse office, wants to leave)      Exercises      General Comments General comments (skin integrity, edema, etc.): pt eager to mobilize with therapy      Pertinent Vitals/Pain Pain Assessment: Faces Faces Pain Scale: No hurt           PT Goals (current goals can now be found in the care plan section) Acute Rehab PT  Goals Patient Stated Goal: none stated PT Goal Formulation: Patient unable to participate in goal setting Time For Goal Achievement: 12/07/18 Potential to Achieve Goals: Fair Progress towards PT goals: Progressing toward goals    Frequency    Min 2X/week      PT Plan Current plan remains appropriate    Co-evaluation PT/OT/SLP Co-Evaluation/Treatment: Yes Reason for Co-Treatment: Complexity of the patient's impairments (multi-system involvement) PT goals addressed during session: Mobility/safety with mobility OT goals  addressed during session: ADL's and self-care      AM-PAC PT "6 Clicks" Mobility   Outcome Measure  Help needed turning from your back to your side while in a flat bed without using bedrails?: A Lot Help needed moving from lying on your back to sitting on the side of a flat bed without using bedrails?: Total Help needed moving to and from a bed to a chair (including a wheelchair)?: Total Help needed standing up from a chair using your arms (e.g., wheelchair or bedside chair)?: Total Help needed to walk in hospital room?: Total Help needed climbing 3-5 steps with a railing? : Total 6 Click Score: 7    End of Session Equipment Utilized During Treatment: Gait belt Activity Tolerance: Patient tolerated treatment well Patient left: in bed;with call bell/phone within reach;with bed alarm set Nurse Communication: Mobility status PT Visit Diagnosis: Unsteadiness on feet (R26.81);Muscle weakness (generalized) (M62.81);Other abnormalities of gait and mobility (R26.89)     Time: 2956-2130 PT Time Calculation (min) (ACUTE ONLY): 18 min  Charges:  $Therapeutic Activity: 8-22 mins                     Kipp Brood, PT, DPT, [facility] Physical Therapist with [facility]  12/03/2018 5:19 PM

## 2018-12-03 NOTE — Progress Notes (Signed)
Manufacturing engineer [facility]) Hospital Liaison:RN Note  Confirmed delivery of DME.   Farrel Gordon, RN, [place] Hospital Liaison [phone]

## 2018-12-03 NOTE — Plan of Care (Signed)
  Problem: Clinical Measurements: Goal: Ability to maintain clinical measurements within normal limits will improve Outcome: Completed/Met Goal: Cardiovascular complication will be avoided Outcome: Completed/Met   Problem: Activity: Goal: Risk for activity intolerance will decrease Outcome: Completed/Met   Problem: Nutrition: Goal: Adequate nutrition will be maintained Outcome: Completed/Met   Problem: Coping: Goal: Level of anxiety will decrease Outcome: Progressing   Problem: Safety: Goal: Ability to remain free from injury will improve Outcome: Completed/Met   Problem: Skin Integrity: Goal: Risk for impaired skin integrity will decrease Outcome: Completed/Met

## 2018-12-03 NOTE — Progress Notes (Signed)
Daily Progress Note   Patient Name: Lindsey Norris       Date: 12/03/2018 DOB: [birth year]  Age: 85 y.o. MRN#: [id number] Attending Physician: Kerney Elbe, DO Primary Care Physician: Haywood Pao, MD Admit Date: 11/21/2018  Reason for Consultation/Follow-up: Establishing goals of care  Subjective: Patient is awake but disoriented x4.  Continues to call out for help at times.  She is not able to participate in conversations regarding goals and is currently under APS emergency guardianship.  Son reported to have hearing scheduled for the end of the week.  Discussed again with Dr. Alfredia Ferguson as well as case management.  Working to arrange discharge with coordination of APS, patient son, and home hospice.  Concern has been having adequate supervision and help for Ms. Irby 24 hours per day, but it now appears that family is going to pay for private duty sitters at her home.     Length of Stay: 11  Current Medications: Scheduled Meds:  . amiodarone  200 mg Oral BID  . [START ON 12/10/2018] amiodarone  200 mg Oral Daily  . enoxaparin (LOVENOX) injection  30 mg Subcutaneous Q24H  . feeding supplement  1 Container Oral BID BM  . feeding supplement (ENSURE ENLIVE)  237 mL Oral BID BM  . multivitamin with minerals  1 tablet Oral Daily  . OLANZapine  2.5 mg Oral Daily  . pantoprazole  40 mg Oral Daily  . sertraline  25 mg Oral Daily    Continuous Infusions:   PRN Meds: acetaminophen **OR** acetaminophen, ALPRAZolam, ipratropium, levalbuterol, ondansetron **OR** ondansetron (ZOFRAN) IV     Vital Signs: BP 103/67 (BP Location: Right Arm)   Pulse 83   Temp (!) 97.5 F (36.4 C) (Oral)   Resp 18   Ht 5\' 3"  (1.6 m)   Wt 38.1 kg   SpO2 99%   BMI 14.88 kg/m  SpO2: SpO2: 99 % O2 Device: O2  Device: Room Air O2 Flow Rate: O2 Flow Rate (L/min): 0 L/min  Intake/output summary:   Intake/Output Summary (Last 24 hours) at 12/03/2018 2136 Last data filed at 12/03/2018 1849 Gross per 24 hour  Intake 480 ml  Output -  Net 480 ml   LBM: Last BM Date: 12/02/18 Baseline Weight: Weight: 43.4 kg Most recent weight: Weight: 38.1 kg       Palliative Assessment/Data: PPS 40% Awake alert Disoriented She is frail Regular breath sounds S1 S2 Abdomen is not tender No edema    Flowsheet Rows     Most Recent Value  Intake Tab  Referral Department  Hospitalist  Unit at Time of Referral  Cardiac/Telemetry Unit  Palliative Care Primary Diagnosis  Neurology  Date Notified  11/23/18  Palliative Care Type  New Palliative care  Reason for referral  Clarify Goals of Care  Date of Admission  11/21/18  Date first seen by Palliative Care  11/25/18  # of days Palliative referral response time  2 Day(s)  # of days IP prior to Palliative referral  2  Clinical Assessment  Palliative Performance Scale Score  40%  Psychosocial & Spiritual Assessment  Palliative Care Outcomes  Patient/Family meeting held?  No  Palliative Care Outcomes  Linked to palliative care logitudinal support      Patient Active Problem List   Diagnosis Date Noted  . Dementia with behavioral disturbance (HCC)   . Goals of care, counseling/discussion   . Palliative care by specialist   . Hypotension 11/21/2018  . Weakness 10/27/2018  . Hypokalemia 10/27/2018  . UTI (urinary tract infection) 10/27/2018  . Hyponatremia 10/27/2018  . Seizures (HCC) 04/25/2017  . Syncope 04/25/2017  . Altered mental status 04/25/2017  . Arthritis   . Seasonal and perennial allergic rhinitis 05/09/2010  . Hyperlipemia 06/11/2007  . Hypertension 06/11/2007  . HEMORRHOIDS 06/11/2007  . COPD, MILD 06/11/2007  . OSTEOARTHRITIS 06/11/2007  . Personal history of colonic polyps 01/15/2007  . Asthma, mild intermittent,  well-controlled 01/15/2007  . GERD 01/15/2007  . GASTRITIS, CHRONIC 04/22/2001    Palliative Care Assessment & Plan   HPI: 85 y.o. female  with past medical history of dementia and HTN admitted on 11/21/2018 with hypotension. UA concerning for UTI and CXR with some infiltrates. Patient thought to be dehydrated. Patient with behavior disturbances r/t dementia. Also now with bursts of SVT - starting on amiodarone drip - being converted to PO amiodarone as of 9/30. Patient now under emergency APS custody d/t spouse not agreeing to transfer to ED, concern of unsafe living environment. PMT consulted for GOC.   Assessment: 1. Recommend continuing work to establish DNR DNI.  Ms. Brownley has multiple comorbidities including dementia and very frail health.  She would not benefit from resuscitative attempt in the event of cardiac or respiratory arrest.  Paperwork with this recommendation has been sent to APS for review.  This can continue to be addressed by hospice if she discharges home with hospice care.  2.  I believe that the care plan that would be best for Ms. Schroepfer would be plan to transition home with hospice so long as it can be arranged that she have 24-hour in-home supervision/care. It is unlikely she will thrive in unfamiliar surroundings of SNF, particularly in light of her difficulties in the hospital with confusion.  Discussed again with care management and now appears family will be able to arrange for round the clock private duty sitters.  If this is the case, I would certainly recommend plan for transition home with hospice services.    Code Status:  Full code -I support that change to DNR is medically appropriate  Prognosis:   guarded, likely less than 6 months. Patient at risk for dehydration, infection, which could shorten her prognosis significantly, at which point, she will benefit from residential hospice.   Discharge Planning:  Recommend home with hospice if it can be arranged.   I believe that this would be in her best interest as long as 24 hour care can be arranged.  If not, may need to consider SNF.  Await input from patient son and APS caseworker.   Care plan was discussed with    Dr. Marland Mcalpine and CM  Thank you for allowing the Palliative Medicine Team to assist in the care of this patient.   Total Time 20 minutes Prolonged Time Billed  no       Greater than 50%  of this time was spent counseling and coordinating care related to the above assessment and plan.  Romie Minus, MD [facility] Palliative Medicine Team [phone]

## 2018-12-04 ENCOUNTER — Encounter: Payer: Self-pay | Admitting: Gynecology

## 2018-12-04 NOTE — TOC Progression Note (Signed)
Transition of Care [facility]) - Progression Note    Patient Details  Name: Lindsey Norris MRN: [id number] Date of Birth: March 07, 1926  Transition of [facility]) CM/SW Contact  Purcell Mouton, RN Phone Number: 12/04/2018, 10:51 AM  Clinical Narrative:    Pt stable at discharge. Son Andrew notified, [place] APS, Lattie Haw with [employer] all notified that pt was discharge going home via [facility].    Expected Discharge Plan: [place] Barriers to Discharge: Continued Medical Work up  Expected Discharge Plan and Services Expected Discharge Plan: [place] In-house Referral: Clinical Social Work Discharge Planning Services: CM Consult Post Acute Care Choice: [place] Living arrangements for the past 2 months: [place] Expected Discharge Date: 12/04/18                         HH Arranged: RN [facility] Agency: [facility] [place]) Date [place]: 12/04/18 Time [place]: 1030 Representative spoke with at [place]: Bevely Palmer  and Lattie Haw with [place] private duty, [facility]   Social Determinants of Health (SDOH) Interventions    Readmission Risk Interventions No flowsheet data found.

## 2018-12-04 NOTE — Plan of Care (Signed)
  Problem: Coping: Goal: Level of anxiety will decrease Outcome: Completed/Met

## 2018-12-04 NOTE — Progress Notes (Signed)
Pt discharged home today per Dr. Alfredia Ferguson. Pt's IV site D/C'd and WDL. Pt's VSS. AVS packet with medication list, discharge instructions and new prescriptions given to PTAR, as well as all of pt's belongings. Pt left floor via stretcher accompanied by PTAR. Son, Ora Bollig., called and made aware of patient's departure by CM, Cookie.

## 2019-03-05 ENCOUNTER — Other Ambulatory Visit: Payer: Self-pay | Admitting: Diagnostic Neuroimaging

## 2019-09-22 IMAGING — CT CT ABD-PELV W/ CM
2 of 4 series · 14 of 46 positions shown, 16 images · IV contrast (APPLIED)
Comparison: None.

CLINICAL DATA: Diffuse abdominal pain and diarrhea.

EXAM:
CT ABDOMEN AND PELVIS WITH CONTRAST
TECHNIQUE: Multidetector CT imaging of the abdomen and pelvis was performed
using the standard protocol following bolus administration of
intravenous contrast.
CONTRAST:  80mL YBTNZR-COO IOPAMIDOL (YBTNZR-COO) INJECTION 61%

[Series 3: abdomen 5.0 · axial · 0.64mm/px · z∈[+760,+1100]mm · 11 of 78 slices shown, 13 images]
[im 5/78  soft-tissue]
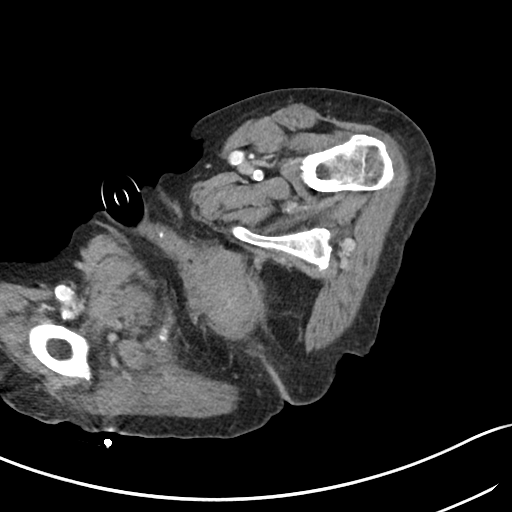
[im 5/78  bone]
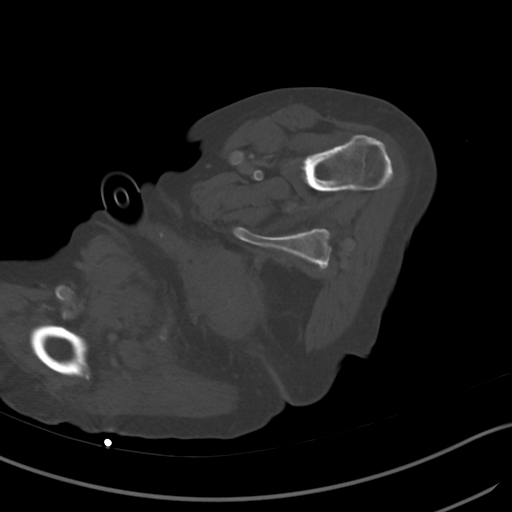
[im 14/78  soft-tissue]
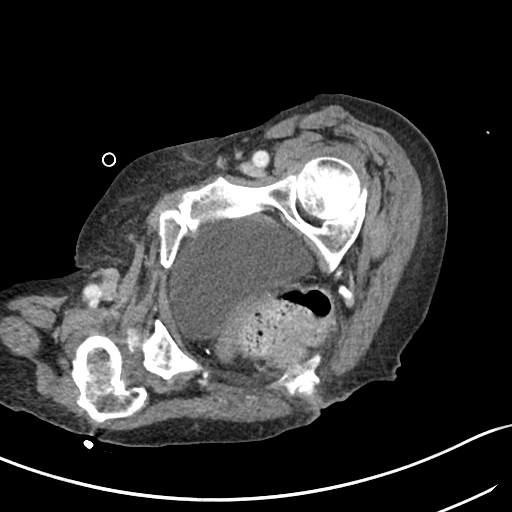
[im 19/78  soft-tissue]
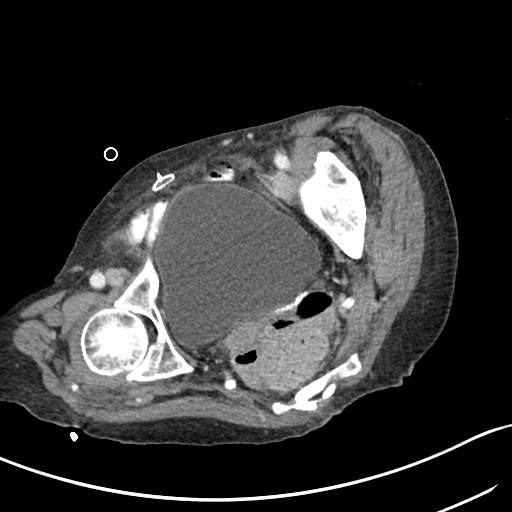
[im 28/78  soft-tissue]
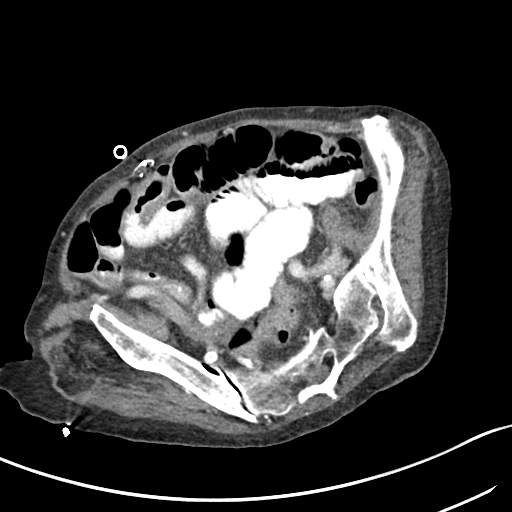
[im 32/78  soft-tissue]
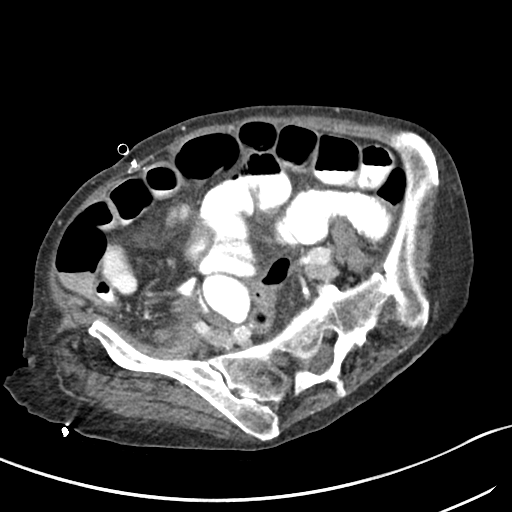
[im 41/78  soft-tissue]
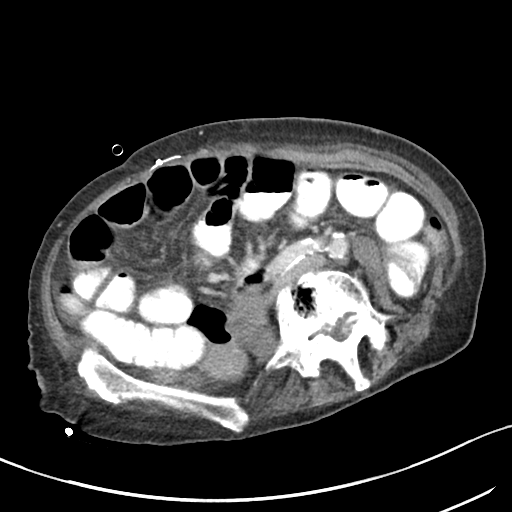
[im 46/78  soft-tissue]
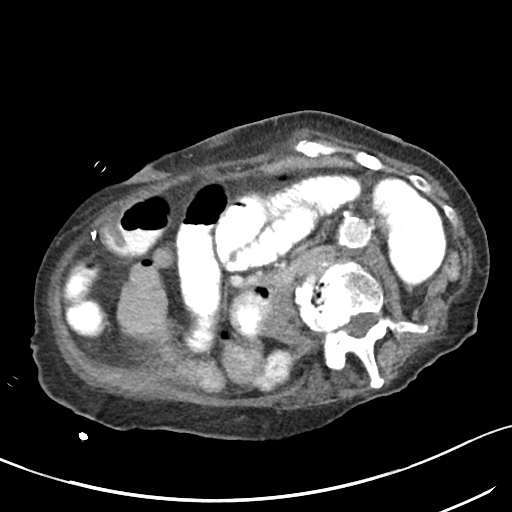
[im 50/78  soft-tissue]
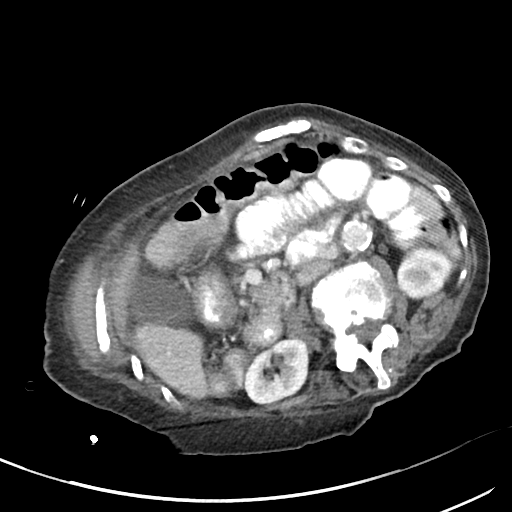
[im 59/78  soft-tissue]
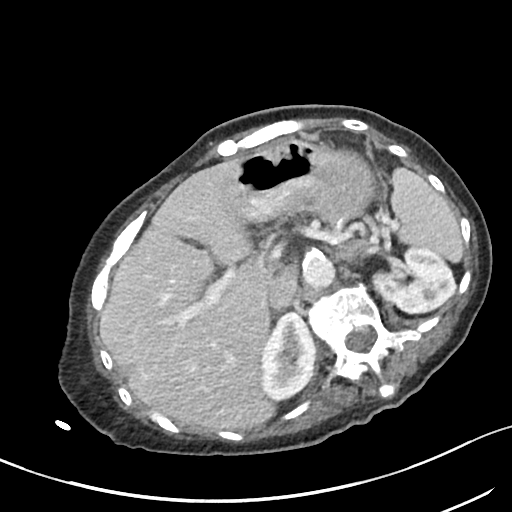
[im 59/78  bone]
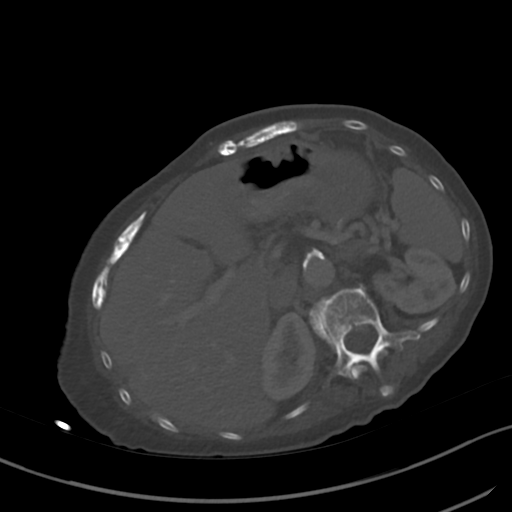
[im 64/78  soft-tissue]
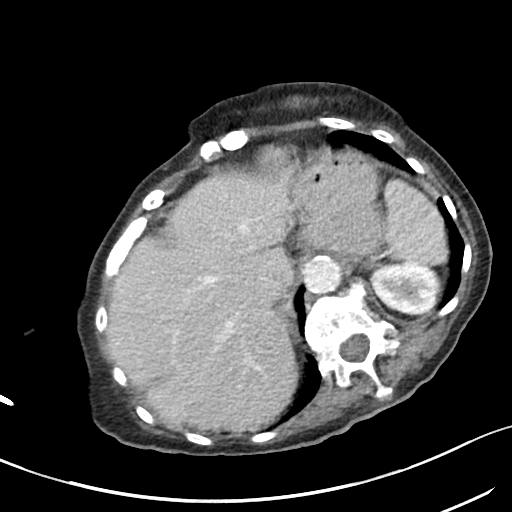
[im 73/78  soft-tissue]
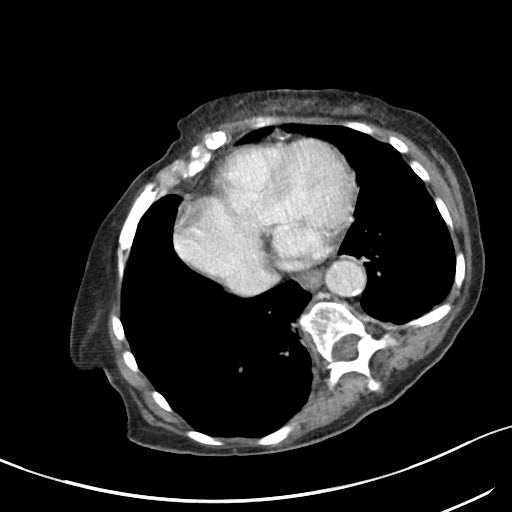

[Series 6: abdomen 3.0 mpr cor · coronal · 0.56mm/px · 3 of 73 slices shown]
[im 25/73  soft-tissue]
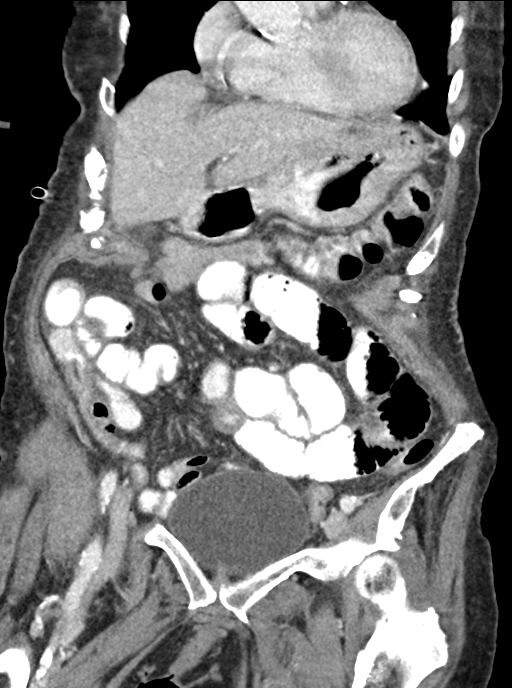
[im 33/73  soft-tissue]
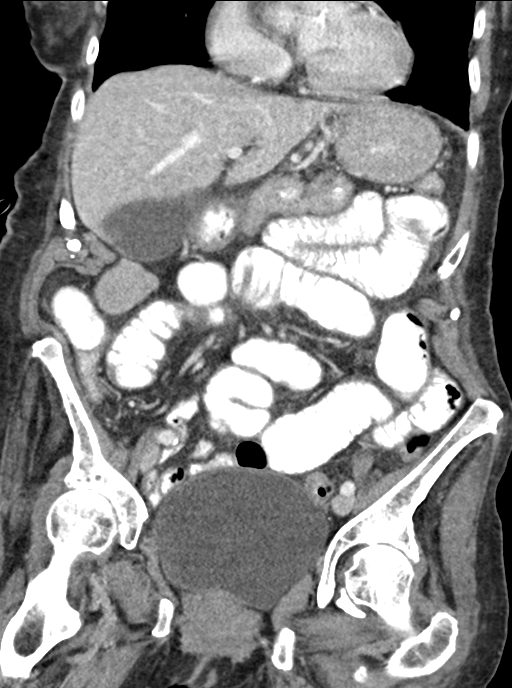
[im 41/73  soft-tissue]
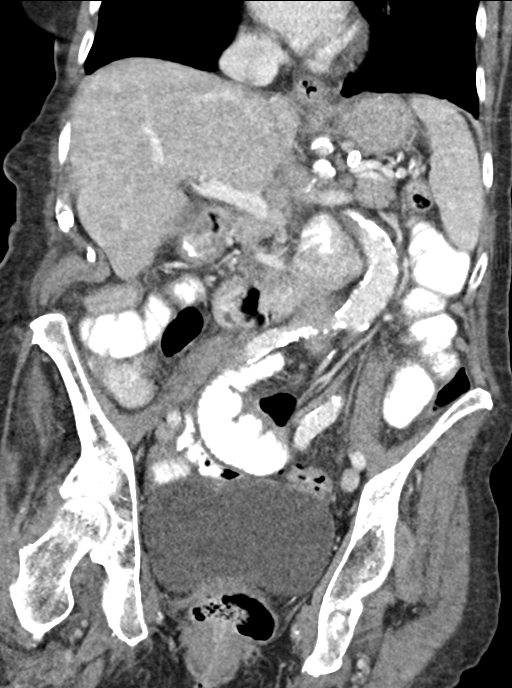

[14 of 46 positions shown; findings below may reference images not displayed]

FINDINGS: Examination is degraded secondary to patient positioning and motion

Lower chest: Limited visualization of lower thorax demonstrates
minimal dependent subsegmental atelectasis. No focal airspace
opacities. No pleural effusion.

Borderline cardiomegaly. Coronary artery calcifications. No
pericardial effusion.

Hepatobiliary: Normal hepatic contour. Punctate subcentimeter
hypoattenuating lesion within the lateral segment of left lobe of
the liver (image 18, series 3) is too small to accurately
characterize though favored to represent a hepatic cyst.

The gallbladder is distended with potential minimal amount of
gallbladder wall thickening though this potentially could be
artifactual due to patient motion. No radiopaque gallstones. No
intra or extrahepatic biliary ductal dilatation. No ascites.

Pancreas: Normal appearance of the pancreas

Spleen: Normal appearance of the spleen

Adrenals/Urinary Tract: There is symmetric enhancement of the
bilateral kidneys. No renal stones on this postcontrast examination.
No discrete renal lesions. No urine obstruction or perinephric
stranding.

Mild thickening the left adrenal gland without discrete nodule.
Normal appearance of the right adrenal gland. Normal appearance of
the urinary bladder given distension.

Stomach/Bowel: The cecum is noted to be located within the right mid
hemiabdomen. Moderate to large colonic stool burden, primarily
within the rectal vault. Ingested enteric contrast extends to the
level of the distal small bowel. Normal appearance of the terminal
ileum. The appendix is not visualized, however there is no pericecal
inflammatory change.

Vascular/Lymphatic: Slightly irregular probably calcified
atherosclerotic plaque within a tortuous but normal caliber
abdominal aorta. The branch vessels of the abdominal aorta appear
patent on this non arterial phase examination.

No bulky retroperitoneal, mesenteric, pelvic or inguinal
lymphadenopathy.

Reproductive: Normal appearance of the pelvic organs for age. No
discrete adnexal lesion. No free fluid in the pelvic cul-de-sac.

Other: Midline metallic sutures without evidence of ventral wall
hernia.

Musculoskeletal: Moderate (approximate 50%) compression deformity
involving the superior endplate of T12 without associated fracture
line. Mild (approximately 25%) compression deformity involving the
superior endplate of L1. Grade 1 anterolisthesis of L5 upon S1
without associated pars defects. Moderate to severe multilevel
lumbar spine DDD. Moderate presumably degenerative scoliotic
curvature of the thoracolumbar spine with dominant caudal component
convex the left.
IMPRESSION: 1. Degraded examination secondary to patient positioning and motion.
2. Gallbladder is distended with potential minimal amount of
gallbladder wall thickening though this could be artifactual due to
patient motion. No radiopaque gallstones. If acute cholecystitis is
of clinical concern, further evaluation right upper quadrant
abdominal ultrasound could be performed as indicated.
3. Otherwise, no definite explanation for patient's abdominal pain
and diarrhea. Specifically, no evidence of enteric or urinary
obstruction.
4.  Aortic Atherosclerosis ([id number].M).

## 2019-09-27 DEATH — deceased

## 2021-02-16 IMAGING — DX PORTABLE CHEST - 1 VIEW
1 series · 1 of 1 positions shown · non-contrast
Comparison: None.

CLINICAL DATA: Weakness

EXAM:
PORTABLE CHEST 1 VIEW

[chest ap]
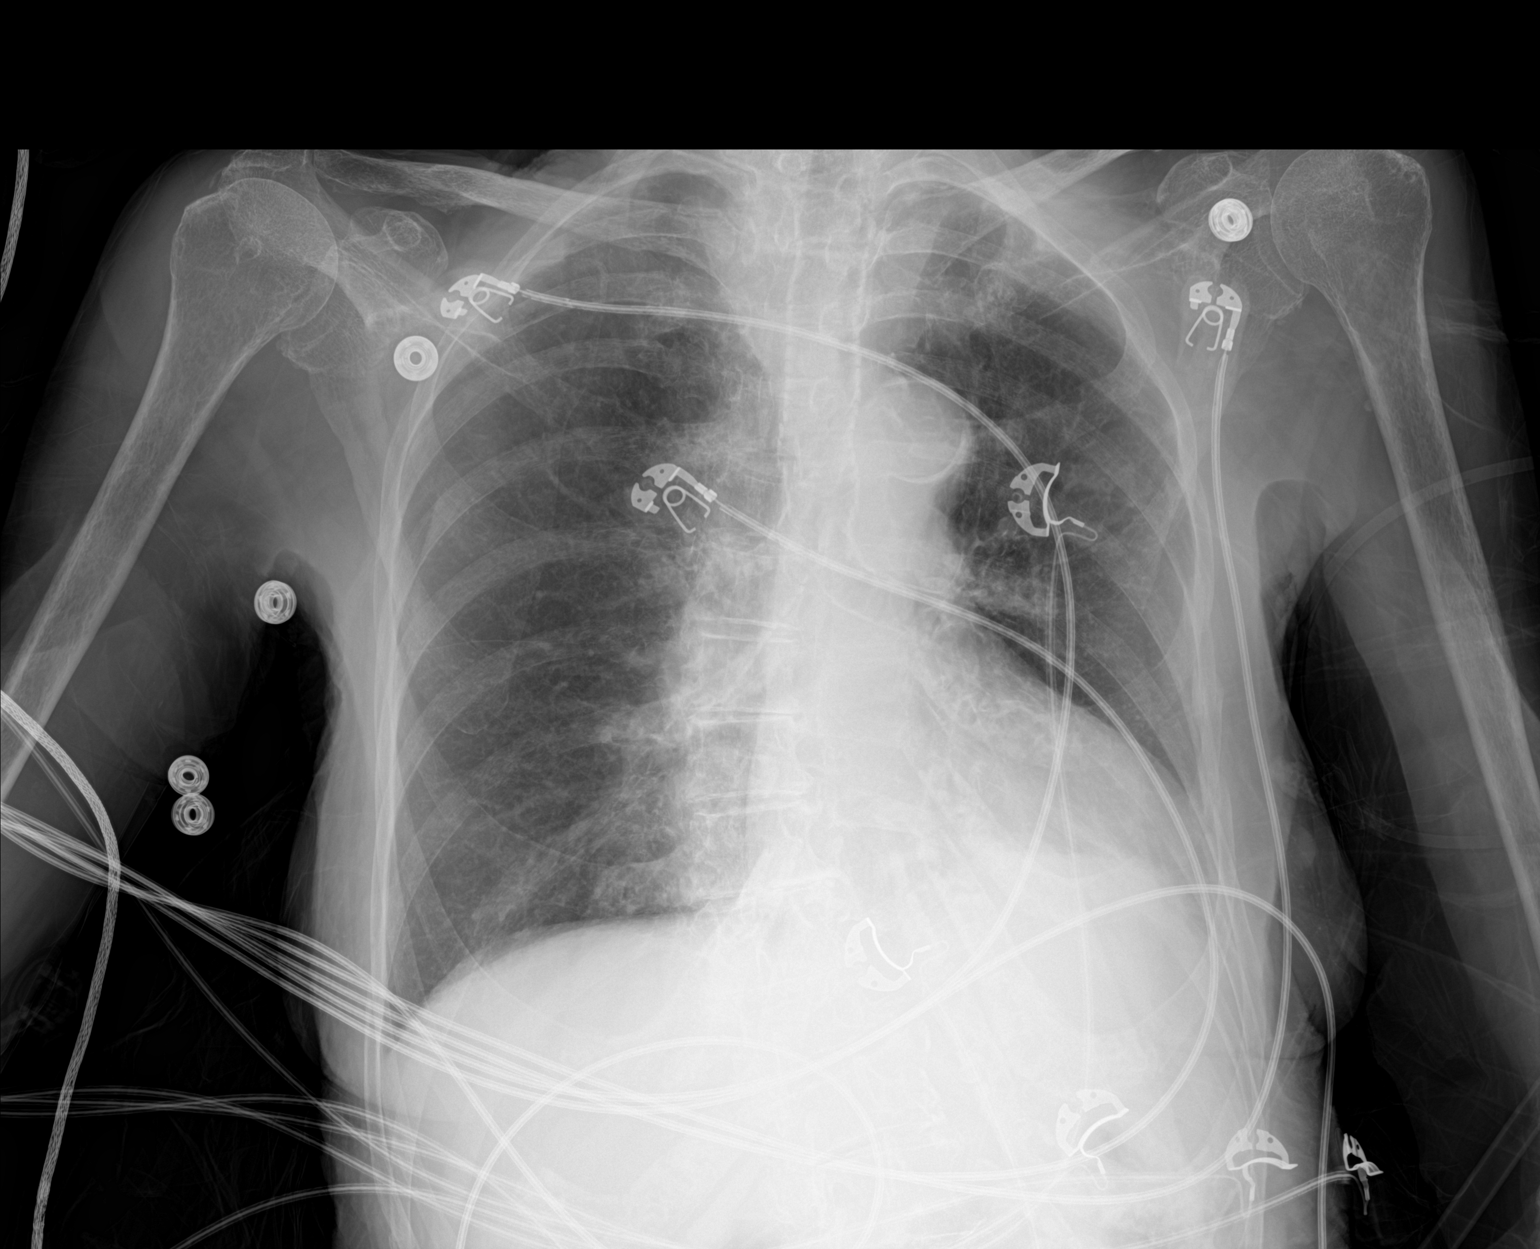

[1 of 1 positions shown; findings below may reference images not displayed]

FINDINGS: Heart size is mildly enlarged. Aortic calcifications are noted.
There is a left basilar airspace opacity. No pneumothorax. There
appears to be a trace left-sided pleural effusion. There appear to
be acute left-sided rib fractures as well as old healed left-sided
rib fractures.
IMPRESSION: 1. Acute appearing fracture involving the sixth rib anterior
laterally on the left. There are additional old healed left-sided
rib fractures.
2. No pneumothorax.
3. Left basilar airspace opacity which may represent an infiltrate
or atelectasis in combination with a trace to small left-sided
pleural effusion.
4. Cardiomegaly.

## 2021-03-14 IMAGING — DX DG CHEST 1V PORT
1 series · 1 of 1 positions shown · non-contrast
Comparison: Radiograph October 26, 2018.

CLINICAL DATA: Weakness.

EXAM:
PORTABLE CHEST 1 VIEW

[chest ap]
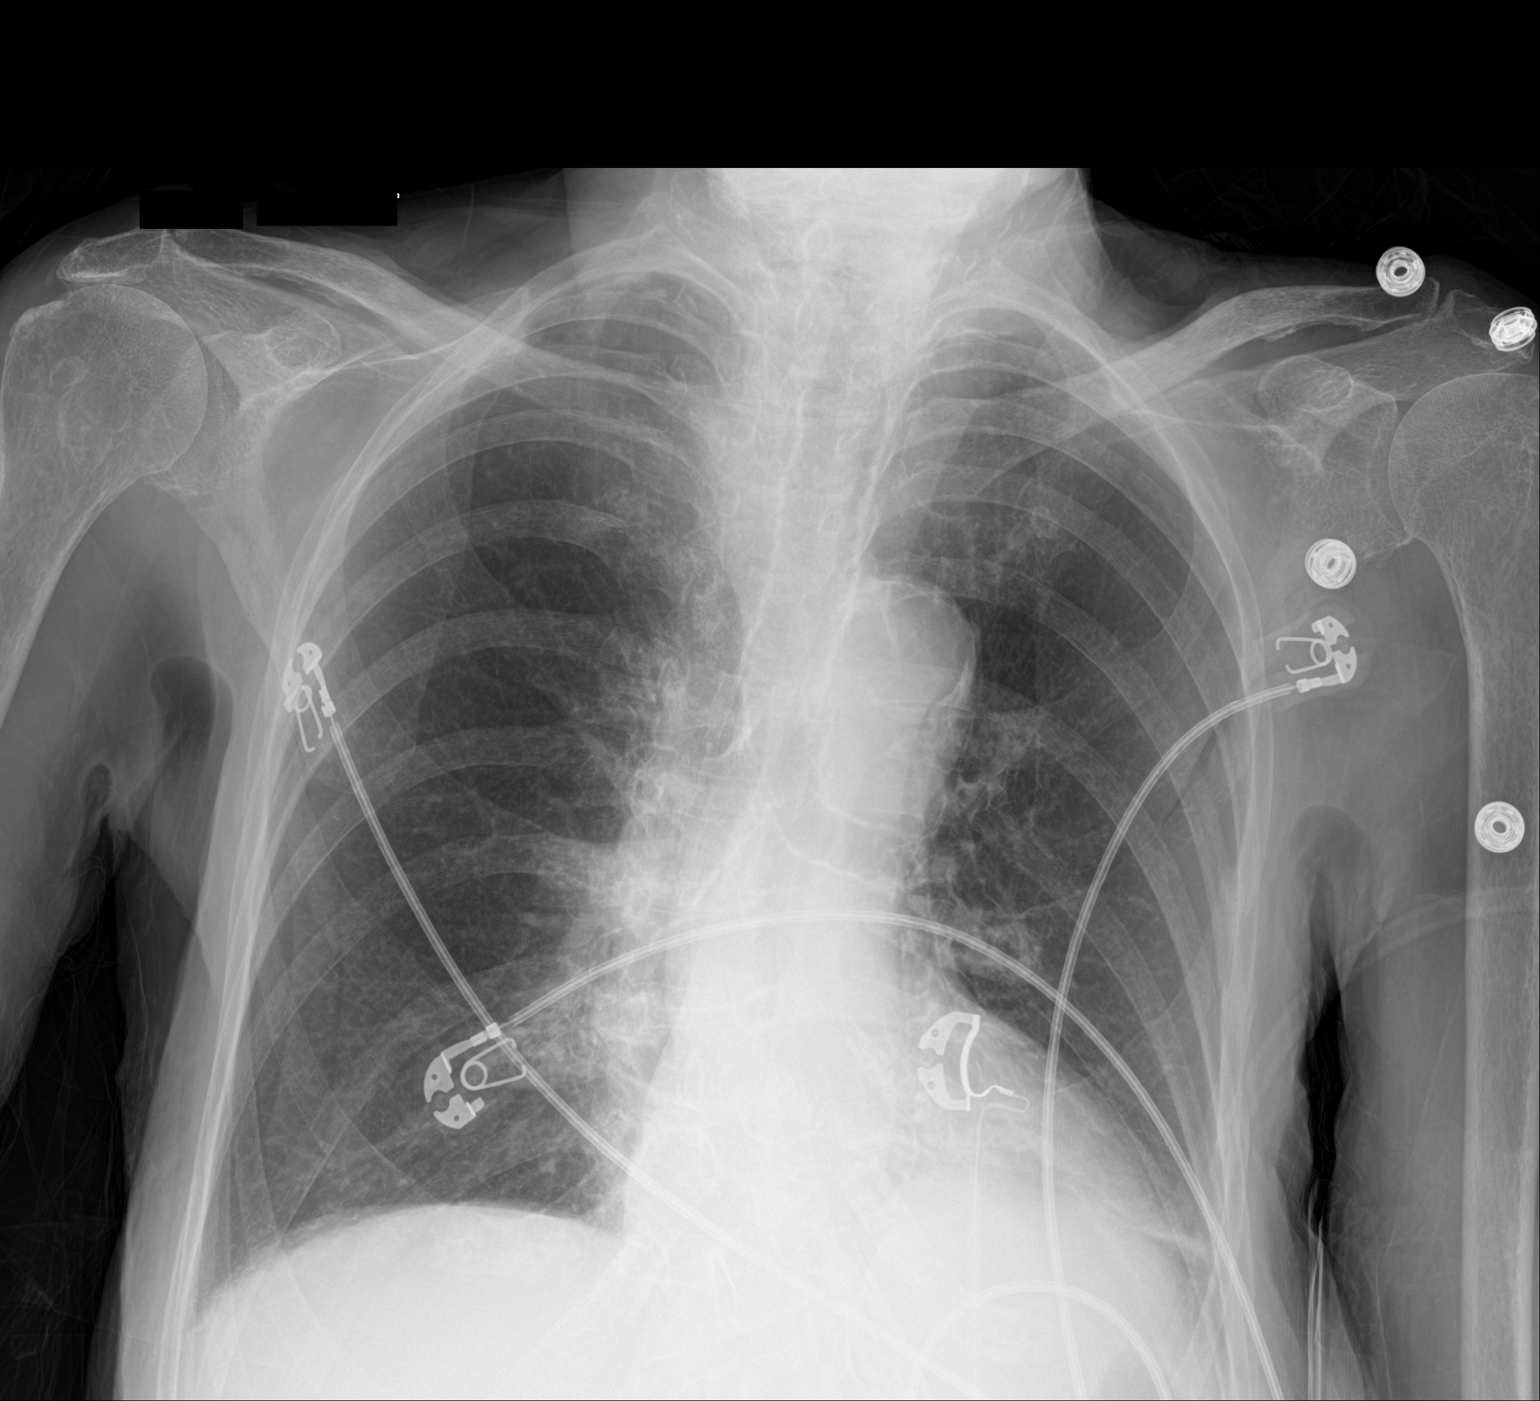

[1 of 1 positions shown; findings below may reference images not displayed]

FINDINGS: Stable cardiomediastinal silhouette. Atherosclerosis of thoracic
aorta is noted. No pneumothorax is noted. No pleural effusion is
noted. Right lung is clear. Mild left basilar atelectasis or
infiltrate is noted. Bony thorax is unremarkable.
IMPRESSION: Stable mild left basilar atelectasis or infiltrate.

Aortic Atherosclerosis (WKQFE-SN2.2).

## 2023-11-21 NOTE — Progress Notes (Signed)
 This encounter was created in error - please disregard.
